# Patient Record
Sex: Female | Born: 1990 | Race: White | Hispanic: No | Marital: Single | Smoking: Current every day smoker
Health system: Southern US, Community
[De-identification: ages and names within clinical notes are randomized; demographics above are authoritative.]

## PROBLEM LIST (undated history)

## (undated) DIAGNOSIS — F329 Major depressive disorder, single episode, unspecified: Secondary | ICD-10-CM

## (undated) DIAGNOSIS — F32A Depression, unspecified: Secondary | ICD-10-CM

## (undated) DIAGNOSIS — F319 Bipolar disorder, unspecified: Secondary | ICD-10-CM

## (undated) DIAGNOSIS — F1910 Other psychoactive substance abuse, uncomplicated: Secondary | ICD-10-CM

## (undated) HISTORY — PX: CHOLECYSTECTOMY: SHX55

## (undated) HISTORY — PX: HERNIA REPAIR: SHX51

## (undated) HISTORY — PX: TUBAL LIGATION: SHX77

## (undated) HISTORY — PX: MYRINGOTOMY: SUR874

---
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
## 2008-02-05 ENCOUNTER — Emergency Department (HOSPITAL_COMMUNITY): Admission: EM | Admit: 2008-02-05 | Discharge: 2008-02-05 | Payer: Self-pay | Admitting: Emergency Medicine

## 2008-02-07 ENCOUNTER — Emergency Department (HOSPITAL_COMMUNITY): Admission: EM | Admit: 2008-02-07 | Discharge: 2008-02-07 | Payer: Self-pay | Admitting: Emergency Medicine

## 2008-02-14 ENCOUNTER — Emergency Department (HOSPITAL_COMMUNITY): Admission: EM | Admit: 2008-02-14 | Discharge: 2008-02-14 | Payer: Self-pay | Admitting: Emergency Medicine

## 2008-11-16 ENCOUNTER — Emergency Department (HOSPITAL_COMMUNITY): Admission: EM | Admit: 2008-11-16 | Discharge: 2008-11-16 | Payer: Self-pay | Admitting: Emergency Medicine

## 2009-10-14 ENCOUNTER — Emergency Department (HOSPITAL_COMMUNITY): Admission: EM | Admit: 2009-10-14 | Discharge: 2009-10-14 | Payer: Self-pay | Admitting: Emergency Medicine

## 2009-11-06 ENCOUNTER — Emergency Department (HOSPITAL_COMMUNITY): Admission: EM | Admit: 2009-11-06 | Discharge: 2009-11-06 | Payer: Self-pay | Admitting: Emergency Medicine

## 2009-12-04 ENCOUNTER — Emergency Department (HOSPITAL_COMMUNITY): Admission: EM | Admit: 2009-12-04 | Discharge: 2009-12-05 | Payer: Self-pay | Admitting: Emergency Medicine

## 2010-01-01 ENCOUNTER — Emergency Department (HOSPITAL_COMMUNITY): Admission: EM | Admit: 2010-01-01 | Discharge: 2010-01-01 | Payer: Self-pay | Admitting: Emergency Medicine

## 2010-01-27 ENCOUNTER — Emergency Department (HOSPITAL_COMMUNITY): Admission: EM | Admit: 2010-01-27 | Discharge: 2010-01-27 | Payer: Self-pay | Admitting: Emergency Medicine

## 2010-03-21 ENCOUNTER — Emergency Department (HOSPITAL_COMMUNITY): Admission: EM | Admit: 2010-03-21 | Discharge: 2010-03-22 | Payer: Self-pay | Admitting: Emergency Medicine

## 2010-06-03 ENCOUNTER — Encounter (HOSPITAL_COMMUNITY): Payer: Self-pay | Admitting: Internal Medicine

## 2010-07-27 LAB — URINALYSIS, ROUTINE W REFLEX MICROSCOPIC
Bilirubin Urine: NEGATIVE
Ketones, ur: NEGATIVE mg/dL
Nitrite: NEGATIVE
Protein, ur: NEGATIVE mg/dL
Specific Gravity, Urine: 1.01 (ref 1.005–1.030)
Urobilinogen, UA: 0.2 mg/dL (ref 0.0–1.0)
pH: 5.5 (ref 5.0–8.0)

## 2010-07-28 LAB — POCT PREGNANCY, URINE: Preg Test, Ur: NEGATIVE

## 2010-07-28 LAB — URINALYSIS, ROUTINE W REFLEX MICROSCOPIC
Bilirubin Urine: NEGATIVE
Protein, ur: 100 mg/dL — AB
Specific Gravity, Urine: 1.02 (ref 1.005–1.030)
Urobilinogen, UA: 1 mg/dL (ref 0.0–1.0)

## 2010-07-29 LAB — URINALYSIS, ROUTINE W REFLEX MICROSCOPIC
Bilirubin Urine: NEGATIVE
Ketones, ur: NEGATIVE mg/dL
Nitrite: NEGATIVE
Protein, ur: NEGATIVE mg/dL
Urobilinogen, UA: 0.2 mg/dL (ref 0.0–1.0)

## 2010-07-29 LAB — PREGNANCY, URINE: Preg Test, Ur: NEGATIVE
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
## 2010-09-28 NOTE — H&P (Signed)
Brittany Payne, LANPHIER NO.:  [id number]   MEDICAL RECORD NO.:  [id number]          PATIENT TYPE:  IPS   LOCATION:  0102                          FACILITY:  BH   PHYSICIAN:  Jasmine Pang, M.D. DATE OF BIRTH:  [birth year]   DATE OF ADMISSION:  07/20/2004  DATE OF DISCHARGE:                         PSYCHIATRIC ADMISSION ASSESSMENT   IDENTIFICATION:  This 20 year old Caucasian female was admitted on an  involuntary basis due to suicidal ideation.   HISTORY OF PRESENT ILLNESS:  The patient states she was admitted due to  making a suicide pact with another boy.  She talked to friends about this  who alerted school staff.  The school then intervened and her parents were  called and she was sent to the mental health center.  She states she has  been depressed with feelings of sadness, hopelessness, worthlessness, low  self-esteem and suicidal.  A major stressors revolves around her  grandmother's death recently.  She was very close to her.   JUSTIFICATION FOR 24-HOUR CARE:  Dangerous to self.   PAST PSYCHIATRIC HISTORY:  A recent psychiatric admission to this hospital  in early February.  Dr. Marlyne Beards put her on Effexor and Seroquel.   SUBSTANCE ABUSE HISTORY:  None.   PAST MEDICAL HISTORY:  Asthma, migraines.   ALLERGIES:  No known drug allergies.   CURRENT MEDICATIONS:  Inderal LA, Singulair, Effexor XR 75 mg q.d., Seroquel  300 mg q.h.s.   FAMILY/SOCIAL HISTORY:  The patient lives in [place] with both  parents.  She gets along with them.  She has four sisters and three  brothers.  She is in the 10th grade and is failing.   MENTAL STATUS EXAM:  The patient presented as a tired, Caucasian female with  poor eye contact.  She was sleepy because she had been asleep prior to this  session.  Speech was soft and slow.  There was psychomotor retardation.  Mood depressed.  Affect sad, constricted.  She denied current suicidal or  homicidal ideation.   Thoughts logical and goal directed.  No psychosis.  Cognitive was grossly within normal limits.   ASSETS/STRENGTHS:  Healthy, somewhat verbal.   ADMISSION DIAGNOSES:   AXIS I:  Mood disorder not otherwise specified.   AXIS II:  Deferred.   AXIS III:  1.  Asthma.  2.  Migraine headaches.   AXIS IV:  Moderate (school).   AXIS V:  Global Assessment of Functioning 25 current; Global Assessment of  Functioning 55 highest past year.   ESTIMATED LENGTH OF STAY:  Five to seven days.   INITIAL DISCHARGE PLAN:  Return home to live with her family.  Follow-up  therapy and medication management will be arranged prior to her discharge.   INITIAL PLAN OF CARE:  Increase Effexor.  Unit therapeutic groups and  activities.  Family therapy.  Discharge planning will be begun immediately  in order to have a smooth transition home.      BHS/MEDQ  D:  07/21/2004  T:  07/21/2004  Job:  [id number]

## 2011-01-28 ENCOUNTER — Encounter: Payer: Self-pay | Admitting: *Deleted

## 2011-01-28 ENCOUNTER — Emergency Department (HOSPITAL_COMMUNITY)
Admission: EM | Admit: 2011-01-28 | Discharge: 2011-01-28 | Disposition: A | Discharge status: Home / Self Care | Payer: Medicaid Other | Attending: Emergency Medicine | Admitting: Emergency Medicine

## 2011-01-28 DIAGNOSIS — F17200 Nicotine dependence, unspecified, uncomplicated: Secondary | ICD-10-CM | POA: Insufficient documentation

## 2011-01-28 DIAGNOSIS — J45909 Unspecified asthma, uncomplicated: Secondary | ICD-10-CM | POA: Insufficient documentation

## 2011-01-28 DIAGNOSIS — K047 Periapical abscess without sinus: Secondary | ICD-10-CM

## 2011-01-28 DIAGNOSIS — K0381 Cracked tooth: Secondary | ICD-10-CM | POA: Insufficient documentation

## 2011-01-28 DIAGNOSIS — K029 Dental caries, unspecified: Secondary | ICD-10-CM | POA: Insufficient documentation

## 2011-01-28 DIAGNOSIS — F319 Bipolar disorder, unspecified: Secondary | ICD-10-CM | POA: Insufficient documentation

## 2011-01-28 HISTORY — DX: Bipolar disorder, unspecified: F31.9

## 2011-01-28 MED ORDER — CLINDAMYCIN HCL 150 MG PO CAPS: 150.0000 mg | ORAL_CAPSULE | Freq: Four times a day (QID) | ORAL | Status: AC

## 2011-01-28 MED ORDER — HYDROCODONE-ACETAMINOPHEN 5-325 MG PO TABS: 1.0000 | ORAL_TABLET | ORAL | Status: AC | PRN

## 2011-01-28 NOTE — ED Provider Notes (Signed)
History     CSN: [id number] Arrival date & time: 01/28/2011  3:20 PM   Chief Complaint  Patient presents with  . Dental Pain     (Include location/radiation/quality/duration/timing/severity/associated sxs/prior treatment) Patient is a 20 y.o. female presenting with tooth pain. The history is provided by the patient.  Dental PainThe primary symptoms include mouth pain and dental injury. Primary symptoms do not include shortness of breath. Primary symptoms comment: she had a fracture of a tooth one week ago which has caused increasing pain.  she was sent by her ob in [place] for treatmentof possible infection.  She was told they would refer her to a dentist as needed. The symptoms began 5 to 7 days ago (she is currently 4 months pregnant.). The symptoms are worsening. The symptoms occur constantly.  Additional symptoms include: dental sensitivity to temperature and gum tenderness. Additional symptoms do not include: gum swelling, trismus, jaw pain, facial swelling, taste disturbance and smell disturbance.     Past Medical History  Diagnosis Date  . Asthma   . Bipolar 1 disorder      Past Surgical History  Procedure Date  . Cholecystectomy     No family history on file.  History  Substance Use Topics  . Smoking status: Current Everyday Smoker -- 0.5 packs/day  . Smokeless tobacco: Not on file  . Alcohol Use: No    OB History    Grav Para Term Preterm Abortions TAB SAB Ect Mult Living   1               Review of Systems  HENT: Negative for facial swelling.   Respiratory: Positive for wheezing. Negative for chest tightness, shortness of breath and stridor.     Allergies  Yellow jacket venom; Penicillins; Toradol; Tramadol; and Vistaril  Home Medications   Current Outpatient Rx  Name Route Sig Dispense Refill  . ACETAMINOPHEN 500 MG PO TABS Oral Take 1,000 mg by mouth once as needed. For pain     . ALBUTEROL SULFATE HFA 108 (90 BASE) MCG/ACT IN AERS Inhalation Inhale  2 puffs into the lungs every 4 (four) hours as needed. For shortness of breath     . FLUTICASONE-SALMETEROL 100-50 MCG/DOSE IN AEPB Inhalation Inhale 1 puff into the lungs every 12 (twelve) hours.      Marland Kitchen CLINDAMYCIN HCL 150 MG PO CAPS Oral Take 1 capsule (150 mg total) by mouth every 6 (six) hours. 28 capsule 0  . HYDROCODONE-ACETAMINOPHEN 5-325 MG PO TABS Oral Take 1 tablet by mouth every 4 (four) hours as needed for pain. 15 tablet 0    Physical Exam    BP 120/66  Pulse 104  Temp(Src) 98.3 F (36.8 C) (Oral)  Resp 20  Ht 5\' 2"  (1.575 m)  Wt 162 lb (73.483 kg)  BMI 29.63 kg/m2  SpO2 100%  Physical Exam  Nursing note and vitals reviewed. Constitutional: She is oriented to person, place, and time. She appears well-developed and well-nourished. No distress.  HENT:  Head: Normocephalic and atraumatic.  Right Ear: Tympanic membrane and external ear normal.  Left Ear: Tympanic membrane and external ear normal.  Mouth/Throat: Oropharynx is clear and moist and mucous membranes are normal. No oral lesions. Dental abscesses present.    Eyes: Conjunctivae are normal.  Neck: Normal range of motion. Neck supple.  Cardiovascular: Normal rate, regular rhythm, normal heart sounds and intact distal pulses.   Pulmonary/Chest: Effort normal. She has wheezes.       Expiratory wheeze throughout.  No respiratory distress. No accessory muscle use.  Abdominal: Soft. Bowel sounds are normal. She exhibits no distension. There is no tenderness.  Musculoskeletal: Normal range of motion.  Lymphadenopathy:    She has no cervical adenopathy.  Neurological: She is alert and oriented to person, place, and time.  Skin: Skin is warm and dry. No erythema.  Psychiatric: She has a normal mood and affect.    ED Course  Procedures      1. Dental abscess      MDM Dental abscess   Discussed respiratory exam.  Patient states she her breathing is much better than 1 week ago.  OB has placed her on advair  which has helped.  Also using albuterol q 4 hours and has med at home.       Candis Musa, PA 01/28/11 1705

## 2011-01-28 NOTE — ED Provider Notes (Signed)
Medical screening examination/treatment/procedure(s) were performed by non-physician practitioner and as supervising physician I was immediately available for consultation/collaboration.   Benny Lennert, MD 01/28/11 [phone]

## 2011-01-28 NOTE — ED Notes (Signed)
Dental pain, patient is 4-5 months pregnant

## 2011-02-07 ENCOUNTER — Encounter (HOSPITAL_COMMUNITY): Payer: Self-pay | Admitting: *Deleted

## 2011-02-07 ENCOUNTER — Emergency Department (HOSPITAL_COMMUNITY)
Admission: EM | Admit: 2011-02-07 | Discharge: 2011-02-07 | Disposition: A | Discharge status: Home / Self Care | Payer: Medicaid Other | Attending: Emergency Medicine | Admitting: Emergency Medicine

## 2011-02-07 DIAGNOSIS — K047 Periapical abscess without sinus: Secondary | ICD-10-CM | POA: Insufficient documentation

## 2011-02-07 DIAGNOSIS — K089 Disorder of teeth and supporting structures, unspecified: Secondary | ICD-10-CM | POA: Insufficient documentation

## 2011-02-07 DIAGNOSIS — K0889 Other specified disorders of teeth and supporting structures: Secondary | ICD-10-CM

## 2011-02-07 DIAGNOSIS — R220 Localized swelling, mass and lump, head: Secondary | ICD-10-CM | POA: Insufficient documentation

## 2011-02-07 MED ORDER — HYDROCODONE-ACETAMINOPHEN 5-325 MG PO TABS: 1.0000 | ORAL_TABLET | ORAL | Status: AC | PRN

## 2011-02-07 MED ORDER — HYDROCODONE-ACETAMINOPHEN 5-325 MG PO TABS
1.0000 | ORAL_TABLET | Freq: Once | ORAL | Status: AC
  Administered 2011-02-07: 1 via ORAL
  Filled 2011-02-07: qty 1

## 2011-02-07 MED ORDER — ONDANSETRON HCL 4 MG PO TABS: 8.0000 mg | ORAL_TABLET | Freq: Three times a day (TID) | ORAL | Status: AC | PRN

## 2011-02-07 NOTE — ED Provider Notes (Signed)
History     CSN: [id number] Arrival date & time: 02/07/2011  3:34 PM  Chief Complaint  Patient presents with  . Dental Pain    (Consider location/radiation/quality/duration/timing/severity/associated sxs/prior treatment) Patient is a 20 y.o. female presenting with tooth pain. The history is provided by the patient.  Dental PainThe primary symptoms include mouth pain. Primary symptoms do not include headaches, fever, shortness of breath or sore throat. Primary symptoms comment: she had a tooth extraction 2 days ago at the dental clinic in [place].  She reports pain and intolerance to the clindamycin she was prescribed,  stating it makes her nauseated. The symptoms began 2 days ago. The symptoms are worsening. The symptoms occur constantly.  Additional symptoms include: gum tenderness and facial swelling. Additional symptoms do not include: drooling and ear pain. Associated symptoms comments: She reports right cheek swelling and fever to 100.1 yesterday, which has resolved.  Patient is currently [redacted] weeks pregnant. She called her gynecologist who cannot see her until next week and was referred here. .     Past Medical History  Diagnosis Date  . Asthma   . Bipolar 1 disorder     Past Surgical History  Procedure Date  . Cholecystectomy     History reviewed. No pertinent family history.  History  Substance Use Topics  . Smoking status: Current Everyday Smoker -- 1.0 packs/day for 8 years    Types: Cigarettes  . Smokeless tobacco: Not on file  . Alcohol Use: No    OB History    Grav Para Term Preterm Abortions TAB SAB Ect Mult Living   1               Review of Systems  Constitutional: Negative for fever.  HENT: Positive for facial swelling and dental problem. Negative for ear pain, congestion, sore throat, drooling, mouth sores, neck pain and ear discharge.   Eyes: Negative.   Respiratory: Negative for chest tightness and shortness of breath.   Cardiovascular: Negative  for chest pain.  Gastrointestinal: Negative for nausea and abdominal pain.  Genitourinary: Negative.   Musculoskeletal: Negative for joint swelling and arthralgias.  Skin: Negative.  Negative for rash and wound.  Neurological: Negative for dizziness, weakness, light-headedness, numbness and headaches.  Hematological: Negative.   Psychiatric/Behavioral: Negative.     Allergies  Yellow jacket venom; Clindamycin/lincomycin; Penicillins; Toradol; Tramadol; and Vistaril  Home Medications   Current Outpatient Rx  Name Route Sig Dispense Refill  . ACETAMINOPHEN 500 MG PO TABS Oral Take 1,000 mg by mouth once as needed. For pain     . ALBUTEROL SULFATE HFA 108 (90 BASE) MCG/ACT IN AERS Inhalation Inhale 2 puffs into the lungs every 4 (four) hours as needed. For shortness of breath     . FLUTICASONE-SALMETEROL 100-50 MCG/DOSE IN AEPB Inhalation Inhale 1 puff into the lungs every 12 (twelve) hours.      Marland Kitchen CLINDAMYCIN HCL 150 MG PO CAPS Oral Take 1 capsule (150 mg total) by mouth every 6 (six) hours. 28 capsule 0  . HYDROCODONE-ACETAMINOPHEN 5-325 MG PO TABS Oral Take 1 tablet by mouth every 4 (four) hours as needed for pain. 15 tablet 0    BP 114/56  Pulse 105  Temp(Src) 98.4 F (36.9 C) (Oral)  Resp 22  Ht 5\' 2"  (1.575 m)  Wt 163 lb (73.936 kg)  BMI 29.81 kg/m2  SpO2 100%  Physical Exam  Nursing note and vitals reviewed. Constitutional: She is oriented to person, place, and time. She appears well-developed  and well-nourished. No distress.  HENT:  Head: Normocephalic and atraumatic.  Right Ear: Tympanic membrane and external ear normal.  Left Ear: Tympanic membrane and external ear normal.  Mouth/Throat: Oropharynx is clear and moist and mucous membranes are normal. No oral lesions. Dental abscesses present.    Eyes: Conjunctivae are normal.  Neck: Normal range of motion. Neck supple.  Cardiovascular: Normal rate, regular rhythm, normal heart sounds and intact distal pulses.     Pulmonary/Chest: Effort normal and breath sounds normal. She has no wheezes.  Abdominal: Soft. Bowel sounds are normal. She exhibits no distension. There is no tenderness.  Musculoskeletal: Normal range of motion.  Lymphadenopathy:    She has no cervical adenopathy.  Neurological: She is alert and oriented to person, place, and time.  Skin: Skin is warm and dry. No erythema.  Psychiatric: She has a normal mood and affect.    ED Course  Procedures (including critical care time)  Labs Reviewed - No data to display No results found.   No diagnosis found.    MDM  Intolerance to clindamycin,  Not allergic reaction.  Encouraged to continue taking clindamycin,  Prescribed zofran to help with nausea.  Also prescribed hydrocodone for pain. Patient was instructed by dentist to swish with warm salt water after meals  Which she is doing.  Follow up next week with gyn.        Candis Musa, PA 02/07/11 [phone]

## 2011-02-07 NOTE — ED Notes (Signed)
Dental pain, states tooth extracted Tuesday at [facility].

## 2011-02-07 NOTE — ED Notes (Signed)
Pt states had upper right tooth extraction on Tuesday at [facility]. Pt is now c/o pain in the "socket". No swelling noted.

## 2011-02-07 NOTE — ED Provider Notes (Signed)
Evaluation and management procedures were performed by the mid-level provider (PA/NP/CNM) under my supervision/collaboration. I was present and available during the ED course. Li Fragoso Y.   Gavin Pound. Carmella Kees, MD 02/07/11 1637

## 2011-03-07 ENCOUNTER — Encounter (HOSPITAL_COMMUNITY): Payer: Self-pay | Admitting: Emergency Medicine

## 2011-03-07 ENCOUNTER — Emergency Department (HOSPITAL_COMMUNITY): Payer: No Typology Code available for payment source

## 2011-03-07 ENCOUNTER — Emergency Department (HOSPITAL_COMMUNITY)
Admission: EM | Admit: 2011-03-07 | Discharge: 2011-03-07 | Disposition: A | Discharge status: Home / Self Care | Payer: No Typology Code available for payment source | Attending: Emergency Medicine | Admitting: Emergency Medicine

## 2011-03-07 DIAGNOSIS — M549 Dorsalgia, unspecified: Secondary | ICD-10-CM | POA: Insufficient documentation

## 2011-03-07 DIAGNOSIS — O2390 Unspecified genitourinary tract infection in pregnancy, unspecified trimester: Secondary | ICD-10-CM | POA: Insufficient documentation

## 2011-03-07 DIAGNOSIS — O99891 Other specified diseases and conditions complicating pregnancy: Secondary | ICD-10-CM | POA: Insufficient documentation

## 2011-03-07 DIAGNOSIS — O2340 Unspecified infection of urinary tract in pregnancy, unspecified trimester: Secondary | ICD-10-CM

## 2011-03-07 DIAGNOSIS — N390 Urinary tract infection, site not specified: Secondary | ICD-10-CM | POA: Insufficient documentation

## 2011-03-07 DIAGNOSIS — R109 Unspecified abdominal pain: Secondary | ICD-10-CM | POA: Insufficient documentation

## 2011-03-07 DIAGNOSIS — O99330 Smoking (tobacco) complicating pregnancy, unspecified trimester: Secondary | ICD-10-CM | POA: Insufficient documentation

## 2011-03-07 LAB — URINALYSIS, ROUTINE W REFLEX MICROSCOPIC
Bilirubin Urine: NEGATIVE
Glucose, UA: NEGATIVE mg/dL
Ketones, ur: NEGATIVE mg/dL
pH: 7 (ref 5.0–8.0)

## 2011-03-07 LAB — CBC
HCT: 32.4 % — ABNORMAL LOW (ref 36.0–46.0)
Hemoglobin: 11.5 g/dL — ABNORMAL LOW (ref 12.0–15.0)
MCH: 32 pg (ref 26.0–34.0)
MCHC: 35.5 g/dL (ref 30.0–36.0)
MCV: 90.3 fL (ref 78.0–100.0)

## 2011-03-07 LAB — URINE MICROSCOPIC-ADD ON

## 2011-03-07 MED ORDER — NITROFURANTOIN MONOHYD MACRO 100 MG PO CAPS: 100.0000 mg | ORAL_CAPSULE | Freq: Two times a day (BID) | ORAL | Status: AC

## 2011-03-07 MED ORDER — ACETAMINOPHEN 325 MG PO TABS
650.0000 mg | ORAL_TABLET | Freq: Once | ORAL | Status: AC
  Administered 2011-03-07: 650 mg via ORAL
  Filled 2011-03-07: qty 2

## 2011-03-07 NOTE — ED Notes (Signed)
Gone to ultrasound

## 2011-03-07 NOTE — Progress Notes (Addendum)
Pt to be removed from EFM/toco.  FHT reassuring and appropriate for GA.  Second RN to review [employer], RNC

## 2011-03-07 NOTE — ED Notes (Signed)
[facility] called and state it is okay to release patient from monitor
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
## 2011-03-07 NOTE — ED Notes (Signed)
States she was evaluated at [facility] a couple of nights ago, her Gyn doctor is in [place]
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
## 2011-03-09 LAB — URINE CULTURE: Colony Count: 10000

## 2011-03-24 ENCOUNTER — Emergency Department (HOSPITAL_COMMUNITY)
Admission: EM | Admit: 2011-03-24 | Discharge: 2011-03-24 | Disposition: A | Discharge status: Home / Self Care | Payer: Medicaid Other | Attending: Emergency Medicine | Admitting: Emergency Medicine

## 2011-03-24 ENCOUNTER — Encounter (HOSPITAL_COMMUNITY): Payer: Self-pay

## 2011-03-24 DIAGNOSIS — F319 Bipolar disorder, unspecified: Secondary | ICD-10-CM | POA: Insufficient documentation

## 2011-03-24 DIAGNOSIS — K089 Disorder of teeth and supporting structures, unspecified: Secondary | ICD-10-CM | POA: Insufficient documentation

## 2011-03-24 DIAGNOSIS — K0889 Other specified disorders of teeth and supporting structures: Secondary | ICD-10-CM

## 2011-03-24 DIAGNOSIS — F17200 Nicotine dependence, unspecified, uncomplicated: Secondary | ICD-10-CM | POA: Insufficient documentation

## 2011-03-24 DIAGNOSIS — J45909 Unspecified asthma, uncomplicated: Secondary | ICD-10-CM | POA: Insufficient documentation

## 2011-03-24 MED ORDER — CLINDAMYCIN HCL 150 MG PO CAPS: 150.0000 mg | ORAL_CAPSULE | Freq: Four times a day (QID) | ORAL | Status: AC

## 2011-03-24 MED ORDER — ACETAMINOPHEN 325 MG PO TABS
650.0000 mg | ORAL_TABLET | Freq: Once | ORAL | Status: AC
  Administered 2011-03-24: 650 mg via ORAL
  Filled 2011-03-24: qty 2

## 2011-03-24 NOTE — ED Notes (Signed)
Pt presents with lower left dental pain. Pt states a piece of her tooth chipped off two days ago.

## 2011-03-24 NOTE — ED Notes (Signed)
fht 140

## 2011-03-24 NOTE — ED Provider Notes (Signed)
History   This chart was scribed for Joya Gaskins, MD by Clarita Crane. The patient was seen in room APA05/APA05 and the patient's care was started at 3:24PM.   CSN: [id number] Arrival date & time: 03/24/2011  3:15 PM   First MD Initiated Contact with Patient 03/24/11 1516      Chief Complaint  Patient presents with  . Dental Pain    HPI Brittany Payne is a 20 y.o. female who is currently pregnant who presents to the Emergency Department complaining of constant moderate, non-radiating right lower dental pain onset 2 days ago and worsening since with no associated symptoms. Patient states she was eating a piece of food when a piece a tooth on her right lower side "chipped off". Patient states pain is not relieved with Tylenol. Denies fever, nausea, vomiting, abdominal pain/cramping, vaginal bleeding. Patient is a current smoker and is followed by Dr. Donzetta Matters in [place], [place] for current pregnancy.   Past Medical History  Diagnosis Date  . Asthma   . Bipolar 1 disorder     Past Surgical History  Procedure Date  . Cholecystectomy     No family history on file.  History  Substance Use Topics  . Smoking status: Current Everyday Smoker -- 1.0 packs/day for 8 years    Types: Cigarettes  . Smokeless tobacco: Not on file  . Alcohol Use: No    OB History    Grav Para Term Preterm Abortions TAB SAB Ect Mult Living   1               Review of Systems 10 Systems reviewed and are negative for acute change except as noted in the HPI.  Allergies  Yellow jacket venom; Penicillins; Toradol; Tramadol; and Vistaril  Home Medications   Current Outpatient Rx  Name Route Sig Dispense Refill  . ACETAMINOPHEN 500 MG PO TABS Oral Take 1,000 mg by mouth once as needed. For pain     . ALBUTEROL SULFATE HFA 108 (90 BASE) MCG/ACT IN AERS Inhalation Inhale 2 puffs into the lungs every 4 (four) hours as needed. For shortness of breath     . FLUTICASONE-SALMETEROL 100-50 MCG/DOSE IN AEPB  Inhalation Inhale 1 puff into the lungs every 12 (twelve) hours.      . SERTRALINE HCL 50 MG PO TABS Oral Take 50 mg by mouth at bedtime.        BP 120/79  Pulse 94  Temp(Src) 97.7 F (36.5 C) (Oral)  Resp 16  Ht 5\' 1"  (1.549 m)  Wt 169 lb 6.4 oz (76.839 kg)  BMI 32.01 kg/m2  SpO2 100%  Physical Exam CONSTITUTIONAL: Well developed/well nourished HEAD AND FACE: Normocephalic/atraumatic EYES: EOMI/PERRL ENMT: Mucous membranes moist, no malocclusion, no trismus, poor dentition, no focal abscess NECK: supple no meningeal signs CV: S1/S2 noted, no murmurs/rubs/gallops noted LUNGS: Lungs are clear to auscultation bilaterally, no apparent distress ABDOMEN: soft, nontender, gravid NEURO: Pt is awake/alert, moves all extremitiesx4 EXTREMITIES: pulses normal, full ROM SKIN: warm, color normal PSYCH: no abnormalities of mood noted  ED Course  Procedures   DIAGNOSTIC STUDIES: Oxygen Saturation is 100% on room air, normal by my interpretation.    COORDINATION OF CARE:     MDM  Nursing notes reviewed and considered in documentation   Pt reports she can take clindamycin     I personally performed the services described in this documentation, which was scribed in my presence. The recorded information has been reviewed and considered.  Joya Gaskins, MD 03/24/11 1550

## 2011-08-16 ENCOUNTER — Emergency Department (HOSPITAL_COMMUNITY)
Admission: EM | Admit: 2011-08-16 | Discharge: 2011-08-16 | Disposition: A | Discharge status: Home / Self Care | Payer: Medicaid Other | Attending: Emergency Medicine | Admitting: Emergency Medicine

## 2011-08-16 ENCOUNTER — Encounter (HOSPITAL_COMMUNITY): Payer: Self-pay

## 2011-08-16 DIAGNOSIS — J45909 Unspecified asthma, uncomplicated: Secondary | ICD-10-CM | POA: Insufficient documentation

## 2011-08-16 DIAGNOSIS — F17200 Nicotine dependence, unspecified, uncomplicated: Secondary | ICD-10-CM | POA: Insufficient documentation

## 2011-08-16 DIAGNOSIS — K0889 Other specified disorders of teeth and supporting structures: Secondary | ICD-10-CM

## 2011-08-16 DIAGNOSIS — K029 Dental caries, unspecified: Secondary | ICD-10-CM | POA: Insufficient documentation

## 2011-08-16 DIAGNOSIS — F319 Bipolar disorder, unspecified: Secondary | ICD-10-CM | POA: Insufficient documentation

## 2011-08-16 MED ORDER — HYDROCODONE-ACETAMINOPHEN 5-325 MG PO TABS
ORAL_TABLET | ORAL | Status: AC
Start: 2011-08-16 — End: 2011-08-26

## 2011-08-16 MED ORDER — DOXYCYCLINE HYCLATE 100 MG PO CAPS: 100.0000 mg | ORAL_CAPSULE | Freq: Two times a day (BID) | ORAL | Status: AC

## 2011-08-16 NOTE — ED Notes (Signed)
C/o of dental pain x 2 days to left lower jaw area, decaying teeth noted, states that she called dentist and unable to make an appt for sooner

## 2011-08-16 NOTE — ED Provider Notes (Signed)
History     CSN: [id number]  Arrival date & time 08/16/11  1148   First MD Initiated Contact with Patient 08/16/11 1157      Chief Complaint  Patient presents with  . Dental Pain    (Consider location/radiation/quality/duration/timing/severity/associated sxs/prior treatment) Patient is a 21 y.o. female presenting with tooth pain. The history is provided by the patient. No language interpreter was used.  Dental PainThe primary symptoms include mouth pain. Primary symptoms do not include dental injury, oral bleeding, oral lesions, headaches, fever, shortness of breath, sore throat, angioedema or cough. The symptoms began 2 days ago. The symptoms are worsening. The symptoms are recurrent. The symptoms occur constantly.  Affected locations include: teeth and gum(s).  Additional symptoms include: dental sensitivity to temperature and gum tenderness. Additional symptoms do not include: gum swelling, purulent gums, trismus, jaw pain, facial swelling, trouble swallowing and drooling. Medical issues include: smoking and periodontal disease.    Past Medical History  Diagnosis Date  . Asthma   . Bipolar 1 disorder     Past Surgical History  Procedure Date  . Cholecystectomy     History reviewed. No pertinent family history.  History  Substance Use Topics  . Smoking status: Current Everyday Smoker -- 1.0 packs/day for 8 years    Types: Cigarettes  . Smokeless tobacco: Not on file  . Alcohol Use: No    OB History    Grav Para Term Preterm Abortions TAB SAB Ect Mult Living   1               Review of Systems  Constitutional: Negative for fever.  HENT: Positive for dental problem. Negative for sore throat, facial swelling, drooling, trouble swallowing, neck pain and neck stiffness.   Respiratory: Negative for cough and shortness of breath.   Neurological: Negative for headaches.  Hematological: Negative for adenopathy.  All other systems reviewed and are  negative.    Allergies  Yellow jacket venom; Clindamycin/lincomycin; Penicillins; Toradol; Tramadol; and Vistaril  Home Medications   Current Outpatient Rx  Name Route Sig Dispense Refill  . ACETAMINOPHEN 500 MG PO TABS Oral Take 1,000 mg by mouth once as needed. For pain     . ALBUTEROL SULFATE HFA 108 (90 BASE) MCG/ACT IN AERS Inhalation Inhale 2 puffs into the lungs every 4 (four) hours as needed. For shortness of breath     . DOXYCYCLINE HYCLATE 100 MG PO CAPS Oral Take 1 capsule (100 mg total) by mouth 2 (two) times daily. 20 capsule 0  . FLUTICASONE-SALMETEROL 100-50 MCG/DOSE IN AEPB Inhalation Inhale 1 puff into the lungs every 12 (twelve) hours.      Marland Kitchen HYDROCODONE-ACETAMINOPHEN 5-325 MG PO TABS  Take one-two tabs po q 4-6 hrs prn pain 15 tablet 0  . SERTRALINE HCL 50 MG PO TABS Oral Take 50 mg by mouth at bedtime.        BP 115/66  Pulse 85  Temp(Src) 97.6 F (36.4 C) (Oral)  Resp 18  Ht 5\' 1"  (1.549 m)  Wt 165 lb (74.844 kg)  BMI 31.18 kg/m2  SpO2 100%  LMP 08/10/2011  Breastfeeding? Unknown  Physical Exam  Nursing note and vitals reviewed. Constitutional: She is oriented to person, place, and time. She appears well-developed and well-nourished. No distress.  HENT:  Head: Normocephalic and atraumatic. No trismus in the jaw.  Mouth/Throat: Uvula is midline, oropharynx is clear and moist and mucous membranes are normal. No oral lesions. Dental caries present. No dental abscesses or  uvula swelling.  Neck: Neck supple.  Cardiovascular: Normal rate, regular rhythm and normal heart sounds.   No murmur heard. Pulmonary/Chest: Effort normal and breath sounds normal.  Musculoskeletal: Normal range of motion.  Lymphadenopathy:    She has no cervical adenopathy.  Neurological: She is alert and oriented to person, place, and time. She exhibits normal muscle tone. Coordination normal.  Skin: Skin is warm and dry.    ED Course  Procedures (including critical care  time)    1. Pain, dental       MDM    Patient is alert. Nontoxic appearing. Chest multiple dental caries and widespread dental disease.  tenderness to palpation over the left lower bicuspids. No obvious dental abscess, facial swelling or trismus. She states she has appointment with the dentist in 2 weeks.   Patient / Family / Caregiver understand and agree with initial ED impression and plan with expectations set for ED visit. Pt stable in ED with no significant deterioration in condition. Pt feels improved after observation and/or treatment in ED.    Danaya Geddis L. Erick Oxendine, [place] 08/16/11 1248

## 2011-08-16 NOTE — ED Notes (Signed)
Pt presents with left lower dental pain x 2 days. Pt with visible tooth decay.

## 2011-08-16 NOTE — Discharge Instructions (Signed)
Dental Pain  A tooth ache may be caused by cavities (tooth decay). Cavities expose the nerve of the tooth to air and hot or cold temperatures. It may come from an infection or abscess (also called a boil or furuncle) around your tooth. It is also often caused by dental caries (tooth decay). This causes the pain you are having.  DIAGNOSIS   Your caregiver can diagnose this problem by exam.  TREATMENT   · If caused by an infection, it may be treated with medications which kill germs (antibiotics) and pain medications as prescribed by your caregiver. Take medications as directed.  · Only take over-the-counter or prescription medicines for pain, discomfort, or fever as directed by your caregiver.  · Whether the tooth ache today is caused by infection or dental disease, you should see your dentist as soon as possible for further care.  SEEK MEDICAL CARE IF:  The exam and treatment you received today has been provided on an emergency basis only. This is not a substitute for complete medical or dental care. If your problem worsens or new problems (symptoms) appear, and you are unable to meet with your dentist, call or return to this location.  SEEK IMMEDIATE MEDICAL CARE IF:   · You have a fever.  · You develop redness and swelling of your face, jaw, or neck.  · You are unable to open your mouth.  · You have severe pain uncontrolled by pain medicine.  MAKE SURE YOU:   · Understand these instructions.  · Will watch your condition.  · Will get help right away if you are not doing well or get worse.  Document Released: 04/29/2005 Document Revised: 04/18/2011 Document Reviewed: 12/16/2007  [employer]® Patient Information ©2012 [employer].

## 2011-08-17 NOTE — ED Provider Notes (Signed)
Medical screening examination/treatment/procedure(s) were performed by non-physician practitioner and as supervising physician I was immediately available for consultation/collaboration.   Benny Lennert, MD 08/17/11 [phone]

## 2011-10-06 ENCOUNTER — Encounter (HOSPITAL_COMMUNITY): Payer: Self-pay | Admitting: *Deleted

## 2011-10-06 ENCOUNTER — Emergency Department (HOSPITAL_COMMUNITY)
Admission: EM | Admit: 2011-10-06 | Discharge: 2011-10-06 | Disposition: A | Discharge status: Home / Self Care | Payer: Medicaid Other | Attending: Emergency Medicine | Admitting: Emergency Medicine

## 2011-10-06 DIAGNOSIS — K029 Dental caries, unspecified: Secondary | ICD-10-CM | POA: Insufficient documentation

## 2011-10-06 DIAGNOSIS — K0889 Other specified disorders of teeth and supporting structures: Secondary | ICD-10-CM

## 2011-10-06 DIAGNOSIS — K089 Disorder of teeth and supporting structures, unspecified: Secondary | ICD-10-CM | POA: Insufficient documentation

## 2011-10-06 DIAGNOSIS — F319 Bipolar disorder, unspecified: Secondary | ICD-10-CM | POA: Insufficient documentation

## 2011-10-06 DIAGNOSIS — J45909 Unspecified asthma, uncomplicated: Secondary | ICD-10-CM | POA: Insufficient documentation

## 2011-10-06 DIAGNOSIS — F17200 Nicotine dependence, unspecified, uncomplicated: Secondary | ICD-10-CM | POA: Insufficient documentation

## 2011-10-06 DIAGNOSIS — Z79899 Other long term (current) drug therapy: Secondary | ICD-10-CM | POA: Insufficient documentation

## 2011-10-06 MED ORDER — DOXYCYCLINE HYCLATE 100 MG PO CAPS: 100.0000 mg | ORAL_CAPSULE | Freq: Two times a day (BID) | ORAL | Status: AC

## 2011-10-06 MED ORDER — HYDROCODONE-ACETAMINOPHEN 5-325 MG PO TABS: ORAL_TABLET | ORAL | Status: AC

## 2011-10-06 NOTE — Discharge Instructions (Signed)
Dental Pain  A tooth ache may be caused by cavities (tooth decay). Cavities expose the nerve of the tooth to air and hot or cold temperatures. It may come from an infection or abscess (also called a boil or furuncle) around your tooth. It is also often caused by dental caries (tooth decay). This causes the pain you are having.  DIAGNOSIS   Your caregiver can diagnose this problem by exam.  TREATMENT   · If caused by an infection, it may be treated with medications which kill germs (antibiotics) and pain medications as prescribed by your caregiver. Take medications as directed.  · Only take over-the-counter or prescription medicines for pain, discomfort, or fever as directed by your caregiver.  · Whether the tooth ache today is caused by infection or dental disease, you should see your dentist as soon as possible for further care.  SEEK MEDICAL CARE IF:  The exam and treatment you received today has been provided on an emergency basis only. This is not a substitute for complete medical or dental care. If your problem worsens or new problems (symptoms) appear, and you are unable to meet with your dentist, call or return to this location.  SEEK IMMEDIATE MEDICAL CARE IF:   · You have a fever.  · You develop redness and swelling of your face, jaw, or neck.  · You are unable to open your mouth.  · You have severe pain uncontrolled by pain medicine.  MAKE SURE YOU:   · Understand these instructions.  · Will watch your condition.  · Will get help right away if you are not doing well or get worse.  Document Released: 04/29/2005 Document Revised: 04/18/2011 Document Reviewed: 12/16/2007  [employer]® Patient Information ©2012 [employer].

## 2011-10-06 NOTE — ED Notes (Signed)
Dental pain, has multiple cavities

## 2011-10-06 NOTE — ED Provider Notes (Signed)
History     CSN: [id number]  Arrival date & time 10/06/11  1402   First MD Initiated Contact with Patient 10/06/11 1514      Chief Complaint  Patient presents with  . Dental Pain    (Consider location/radiation/quality/duration/timing/severity/associated sxs/prior treatment) Patient is a 21 y.o. female presenting with tooth pain. The history is provided by the patient.  Dental PainThe primary symptoms include mouth pain. Primary symptoms do not include oral bleeding, headaches, fever, shortness of breath, sore throat, angioedema or cough. The symptoms began 3 to 5 days ago. The symptoms are unchanged. The symptoms are recurrent. The symptoms occur constantly.  Mouth pain occurs constantly. Mouth pain is unchanged. Affected locations include: teeth and gum(s).  Additional symptoms include: dental sensitivity to temperature and gum tenderness. Additional symptoms do not include: gum swelling, trismus, facial swelling, trouble swallowing, drooling, ear pain and swollen glands. Medical issues include: smoking and periodontal disease.    Past Medical History  Diagnosis Date  . Asthma   . Bipolar 1 disorder     Past Surgical History  Procedure Date  . Cholecystectomy     No family history on file.  History  Substance Use Topics  . Smoking status: Current Everyday Smoker -- 1.0 packs/day for 8 years    Types: Cigarettes  . Smokeless tobacco: Not on file  . Alcohol Use: No    OB History    Grav Para Term Preterm Abortions TAB SAB Ect Mult Living   1               Review of Systems  Constitutional: Negative for fever and appetite change.  HENT: Positive for dental problem. Negative for ear pain, congestion, sore throat, facial swelling, drooling, trouble swallowing, neck pain and neck stiffness.   Eyes: Negative for pain and visual disturbance.  Respiratory: Negative for cough and shortness of breath.   Neurological: Negative for dizziness, facial asymmetry and headaches.    Hematological: Negative for adenopathy.  All other systems reviewed and are negative.    Allergies  Yellow jacket venom; Clindamycin/lincomycin; Ketorolac tromethamine; Penicillins; Tramadol; and Vistaril  Home Medications   Current Outpatient Rx  Name Route Sig Dispense Refill  . ACETAMINOPHEN 500 MG PO TABS Oral Take 1,000 mg by mouth once as needed. For pain     . ALBUTEROL SULFATE HFA 108 (90 BASE) MCG/ACT IN AERS Inhalation Inhale 2 puffs into the lungs every 4 (four) hours as needed. For shortness of breath     . FLUTICASONE-SALMETEROL 100-50 MCG/DOSE IN AEPB Inhalation Inhale 1 puff into the lungs every 12 (twelve) hours.      . SERTRALINE HCL 50 MG PO TABS Oral Take 50 mg by mouth at bedtime.        BP 118/93  Pulse 91  Temp 97.7 F (36.5 C)  Resp 20  Ht 5\' 2"  (1.575 m)  Wt 153 lb (69.4 kg)  BMI 27.98 kg/m2  SpO2 100%  Breastfeeding? Unknown  Physical Exam  Nursing note and vitals reviewed. Constitutional: She appears well-developed and well-nourished. No distress.  HENT:  Head: Normocephalic and atraumatic. No trismus in the jaw.  Mouth/Throat: Uvula is midline, oropharynx is clear and moist and mucous membranes are normal. Dental caries present. No dental abscesses or uvula swelling.       Multiple dental caries and erythema and tenderness of the right upper gums.  No abscess    ED Course  Procedures (including critical care time)  MDM    Previous medical charts, nursing notes and vitals signs from this visit were reviewed by me   All laboratory results and/or imaging results performed on this visit, if applicable, were reviewed by me and discussed with the patient and/or parent as well as recommendation for follow-up    MEDICATIONS GIVEN IN ED:  none   Multiple dental caries and gingivitis. No abscess.   No trismus or facial swelling.  Airway patent.     PRESCRIPTIONS GIVEN AT DISCHARGE:  norco #12, doxy     Pt stable in ED with no  significant deterioration in condition. Pt feels improved after observation and/or treatment in ED. Patient / Family / Caregiver understand and agree with initial ED impression and plan with expectations set for ED visit.  Patient agrees to return to ED for any worsening symptoms            Kagan Hietpas L. Jayel Scaduto, [place] 10/08/11 1143

## 2011-10-10 NOTE — ED Provider Notes (Signed)
Medical screening examination/treatment/procedure(s) were performed by non-physician practitioner and as supervising physician I was immediately available for consultation/collaboration.   Chicquita Mendel M Juwon Scripter, MD 10/10/11 0048 

## 2011-10-25 ENCOUNTER — Emergency Department (HOSPITAL_COMMUNITY)
Admission: EM | Admit: 2011-10-25 | Discharge: 2011-10-25 | Disposition: A | Discharge status: Home / Self Care | Payer: Medicaid Other | Attending: Emergency Medicine | Admitting: Emergency Medicine

## 2011-10-25 DIAGNOSIS — F319 Bipolar disorder, unspecified: Secondary | ICD-10-CM | POA: Insufficient documentation

## 2011-10-25 DIAGNOSIS — F17200 Nicotine dependence, unspecified, uncomplicated: Secondary | ICD-10-CM | POA: Insufficient documentation

## 2011-10-25 DIAGNOSIS — Z79899 Other long term (current) drug therapy: Secondary | ICD-10-CM | POA: Insufficient documentation

## 2011-10-25 DIAGNOSIS — H7290 Unspecified perforation of tympanic membrane, unspecified ear: Secondary | ICD-10-CM

## 2011-10-25 DIAGNOSIS — J45909 Unspecified asthma, uncomplicated: Secondary | ICD-10-CM | POA: Insufficient documentation

## 2011-10-25 MED ORDER — HYDROCODONE-ACETAMINOPHEN 5-500 MG PO TABS: 1.0000 | ORAL_TABLET | ORAL | Status: AC | PRN

## 2011-10-25 MED ORDER — CLINDAMYCIN HCL 150 MG PO CAPS: 150.0000 mg | ORAL_CAPSULE | Freq: Three times a day (TID) | ORAL | Status: AC

## 2011-10-25 NOTE — ED Provider Notes (Signed)
History     CSN: [id number]  Arrival date & time 10/25/11  Rickey Primus   First MD Initiated Contact with Patient 10/25/11 1823      Chief Complaint  Patient presents with  . Otalgia     The history is provided by the patient.   patient reports 3 days of worsening left ear pain.  Yesterday she had foul-smelling drainage coming from her left ear.  She reports ongoing pain in her left ear this time.  She has no fevers or chills.  She denies sore throat.  Her pain is mild to moderate.  Nothing worsens her pain.  Nothing improves her pain.  Her pain is constant.  Past Medical History  Diagnosis Date  . Asthma   . Bipolar 1 disorder     Past Surgical History  Procedure Date  . Cholecystectomy     No family history on file.  History  Substance Use Topics  . Smoking status: Current Everyday Smoker -- 1.0 packs/day for 8 years    Types: Cigarettes  . Smokeless tobacco: Not on file  . Alcohol Use: No    OB History    Grav Para Term Preterm Abortions TAB SAB Ect Mult Living   1               Review of Systems  HENT: Positive for ear pain.   All other systems reviewed and are negative.    Allergies  Yellow jacket venom; Ketorolac tromethamine; Penicillins; Tramadol; and Vistaril  Home Medications   Current Outpatient Rx  Name Route Sig Dispense Refill  . ACETAMINOPHEN 500 MG PO TABS Oral Take 1,000 mg by mouth once as needed. For pain     . ALBUTEROL SULFATE HFA 108 (90 BASE) MCG/ACT IN AERS Inhalation Inhale 2 puffs into the lungs every 4 (four) hours as needed. For shortness of breath     . CLINDAMYCIN HCL 150 MG PO CAPS Oral Take 1 capsule (150 mg total) by mouth 3 (three) times daily. 15 capsule 0  . FLUTICASONE-SALMETEROL 100-50 MCG/DOSE IN AEPB Inhalation Inhale 1 puff into the lungs every 12 (twelve) hours.      Marland Kitchen HYDROCODONE-ACETAMINOPHEN 5-500 MG PO TABS Oral Take 1 tablet by mouth every 4 (four) hours as needed for pain. 8 tablet 0  . SERTRALINE HCL 50 MG PO TABS  Oral Take 50 mg by mouth at bedtime.        BP 107/62  Pulse 81  Temp 98 F (36.7 C) (Oral)  Resp 16  Ht 5\' 2"  (1.575 m)  Wt 158 lb (71.668 kg)  BMI 28.90 kg/m2  SpO2 100%  Breastfeeding? Unknown  Physical Exam  Constitutional: She is oriented to person, place, and time. She appears well-developed and well-nourished.  HENT:  Head: Normocephalic.       Right TM and right ear normal.  Left ear is normal however the left tympanic membrane is ruptured.  There is no swelling or erythema of the left auditory canal.  No drainage noted coming from the left ear.  No left mastoid tenderness  Eyes: EOM are normal.  Neck: Normal range of motion.  Pulmonary/Chest: Effort normal.  Abdominal: She exhibits no distension.  Musculoskeletal: Normal range of motion.  Neurological: She is alert and oriented to person, place, and time.  Skin: Skin is warm.  Psychiatric: She has a normal mood and affect.    ED Course  Procedures (including critical care time)  Labs Reviewed - No data to display  No results found.   1. Ruptured tympanic membrane       MDM  The patient is a ruptured left TM likely from a otitis media.  Patient be referred to ear nose and throat.  She has 3 days of clindamycin at home she'll be prescribed another 5 for complete course.  A short course pain medicine also.  She was told not to swim, take baths, or place drops in her left ear until seen by the anesthetist surgeon        Lyanne Co, MD 10/25/11 [phone]

## 2011-10-25 NOTE — Discharge Instructions (Signed)
Eardrum Perforation The eardrum is a thin, round tissue inside the ear that separates the ear canal from the middle ear. This is the tissue that detects sound and enables you to hear. The eardrum can be punctured or torn (perforated). Eardrums generally heal without help and with little or no permanent hearing loss. CAUSES   Sudden pressure changes that happen in situations like scuba diving or flying in an airplane.   Foreign objects in the ear.   Inserting a cotton-tipped swab in the ear.   Loud noise.   Trauma to the ear.  SYMPTOMS   Hearing loss.   Ear pain.   Ringing in the ears.   Discharge or bleeding from the ear.   Dizziness.   Vomiting.   Facial paralysis.  HOME CARE INSTRUCTIONS   Keep your ear dry, as this improves healing. Swimming, diving, and showers are not allowed until healing is complete. While bathing, protect the ear by placing a piece of cotton covered with petroleum jelly in the outer ear canal.   Only take over-the-counter or prescription medicines for pain, discomfort, or fever as directed by your caregiver.   Blow your nose gently. Forceful blowing increases the pressure in the middle ear and may cause further injury or delay healing.   Resume normal activities, such as showering, when the perforation has healed. Your caregiver can let you know when this has occurred.   Talk to your caregiver before flying on an airplane. Air travel is generally allowed with a perforated eardrum.   If your caregiver has given you a follow-up appointment, it is very important to keep that appointment. Failure to keep the appointment could result in a chronic or permanent injury, pain, hearing loss, and disability.  SEEK IMMEDIATE MEDICAL CARE IF:   You have bleeding or pus coming from your ear.   You have problems with balance, dizziness, nausea, or vomiting.   You develop increased pain.   You have a fever.  MAKE SURE YOU:   Understand these instructions.     Will watch your condition.   Will get help right away if you are not doing well or get worse.  Document Released: 04/26/2000 Document Revised: 04/18/2011 Document Reviewed: 04/28/2008 [employer] Patient Information 2012 [employer]. 

## 2011-10-25 NOTE — ED Notes (Signed)
Lt ear ache for 3 days, Has had tubes in ears in past.

## 2012-01-10 ENCOUNTER — Emergency Department (HOSPITAL_COMMUNITY)
Admission: EM | Admit: 2012-01-10 | Discharge: 2012-01-10 | Disposition: A | Discharge status: Home / Self Care | Payer: Medicaid Other | Attending: Emergency Medicine | Admitting: Emergency Medicine

## 2012-01-10 ENCOUNTER — Emergency Department (HOSPITAL_COMMUNITY): Payer: Medicaid Other

## 2012-01-10 ENCOUNTER — Encounter (HOSPITAL_COMMUNITY): Payer: Self-pay

## 2012-01-10 DIAGNOSIS — N760 Acute vaginitis: Secondary | ICD-10-CM | POA: Insufficient documentation

## 2012-01-10 DIAGNOSIS — F17200 Nicotine dependence, unspecified, uncomplicated: Secondary | ICD-10-CM | POA: Insufficient documentation

## 2012-01-10 DIAGNOSIS — J45909 Unspecified asthma, uncomplicated: Secondary | ICD-10-CM | POA: Insufficient documentation

## 2012-01-10 DIAGNOSIS — R109 Unspecified abdominal pain: Secondary | ICD-10-CM

## 2012-01-10 DIAGNOSIS — F319 Bipolar disorder, unspecified: Secondary | ICD-10-CM | POA: Insufficient documentation

## 2012-01-10 DIAGNOSIS — B9689 Other specified bacterial agents as the cause of diseases classified elsewhere: Secondary | ICD-10-CM | POA: Insufficient documentation

## 2012-01-10 DIAGNOSIS — A499 Bacterial infection, unspecified: Secondary | ICD-10-CM | POA: Insufficient documentation

## 2012-01-10 LAB — CBC WITH DIFFERENTIAL/PLATELET
Basophils Absolute: 0 10*3/uL (ref 0.0–0.1)
Eosinophils Absolute: 0.3 10*3/uL (ref 0.0–0.7)
Eosinophils Relative: 5 % (ref 0–5)
Lymphocytes Relative: 21 % (ref 12–46)
Lymphs Abs: 1.2 10*3/uL (ref 0.7–4.0)
Neutrophils Relative %: 66 % (ref 43–77)
Platelets: 121 10*3/uL — ABNORMAL LOW (ref 150–400)
RBC: 4.22 MIL/uL (ref 3.87–5.11)
RDW: 13.9 % (ref 11.5–15.5)
WBC: 5.8 10*3/uL (ref 4.0–10.5)

## 2012-01-10 LAB — URINALYSIS, ROUTINE W REFLEX MICROSCOPIC
Leukocytes, UA: NEGATIVE
Nitrite: NEGATIVE
Specific Gravity, Urine: 1.02 (ref 1.005–1.030)
Urobilinogen, UA: 0.2 mg/dL (ref 0.0–1.0)
pH: 6 (ref 5.0–8.0)

## 2012-01-10 LAB — BASIC METABOLIC PANEL
CO2: 26 mEq/L (ref 19–32)
Calcium: 9.1 mg/dL (ref 8.4–10.5)
GFR calc non Af Amer: >90 mL/min (ref >90)
Glucose, Bld: 76 mg/dL (ref 70–99)
Potassium: 3.4 mEq/L — ABNORMAL LOW (ref 3.5–5.1)
Sodium: 137 mEq/L (ref 135–145)

## 2012-01-10 LAB — WET PREP, GENITAL: Yeast Wet Prep HPF POC: NONE SEEN

## 2012-01-10 MED ORDER — METRONIDAZOLE 500 MG PO TABS: 500.0000 mg | ORAL_TABLET | Freq: Two times a day (BID) | ORAL | Status: AC

## 2012-01-10 MED ORDER — METOCLOPRAMIDE HCL 10 MG PO TABS: 10.0000 mg | ORAL_TABLET | Freq: Four times a day (QID) | ORAL | Status: DC

## 2012-01-10 MED ORDER — HYDROCODONE-ACETAMINOPHEN 5-325 MG PO TABS: ORAL_TABLET | ORAL | Status: AC

## 2012-01-10 NOTE — ED Provider Notes (Signed)
History     CSN: [id number]  Arrival date & time 01/10/12  0745   First MD Initiated Contact with Patient 01/10/12 [phone]      Chief Complaint  Patient presents with  . Abdominal Pain  . Vaginal Discharge    (Consider location/radiation/quality/duration/timing/severity/associated sxs/prior treatment) Patient is a 21 y.o. female presenting with abdominal pain. The history is provided by the patient.  Abdominal Pain The primary symptoms of the illness include abdominal pain, nausea, diarrhea and vaginal discharge. The primary symptoms of the illness do not include fever, fatigue, shortness of breath, vomiting, hematemesis, hematochezia, dysuria or vaginal bleeding. The current episode started more than 2 days ago. The onset of the illness was gradual. The problem has not changed since onset. The pain came on gradually. The abdominal pain has been gradually worsening since its onset. The abdominal pain is located in the LLQ and left flank. The abdominal pain radiates to the left flank. The abdominal pain is relieved by nothing. Exacerbated by: nothing.  The diarrhea began 3 to 5 days ago. The diarrhea is semi-solid. The diarrhea occurs 2 to 4 times per day.  The vaginal discharge was first noticed more than 2 days ago. Vaginal discharge is a new problem. The patient believes that the vaginal discharge is unchanged since it began. The amount of discharge is moderate. The color of the discharge is white and yellow. The vaginal discharge is not associated with itching, burning or dysuria.   The patient states that she believes she is currently not pregnant. The patient has had a change in bowel habit. Additional symptoms associated with the illness include back pain. Symptoms associated with the illness do not include chills, anorexia, diaphoresis, heartburn, constipation, urgency, hematuria or frequency. Significant associated medical issues do not include PUD, GERD, inflammatory bowel disease,  diabetes, substance abuse or diverticulitis.    Past Medical History  Diagnosis Date  . Asthma   . Bipolar 1 disorder     Past Surgical History  Procedure Date  . Cholecystectomy   . Myringotomy     No family history on file.  History  Substance Use Topics  . Smoking status: Current Everyday Smoker -- 1.0 packs/day for 8 years    Types: Cigarettes  . Smokeless tobacco: Not on file  . Alcohol Use: No    OB History    Grav Para Term Preterm Abortions TAB SAB Ect Mult Living   1               Review of Systems  Constitutional: Negative for fever, chills, diaphoresis and fatigue.  Respiratory: Negative for shortness of breath.   Cardiovascular: Negative for chest pain.  Gastrointestinal: Positive for nausea, abdominal pain and diarrhea. Negative for heartburn, vomiting, constipation, blood in stool, hematochezia, abdominal distention, anal bleeding, rectal pain, anorexia and hematemesis.  Genitourinary: Positive for flank pain and vaginal discharge. Negative for dysuria, urgency, frequency, hematuria, vaginal bleeding, difficulty urinating, genital sores and vaginal pain.  Musculoskeletal: Positive for back pain.  Skin: Negative for color change and itching.  Neurological: Negative for dizziness and headaches.  Hematological: Negative for adenopathy.  All other systems reviewed and are negative.    Allergies  Yellow jacket venom; Ketorolac tromethamine; Penicillins; Tramadol; and Vistaril  Home Medications   Current Outpatient Rx  Name Route Sig Dispense Refill  . ZOLPIDEM TARTRATE 5 MG PO TABS Oral Take 5 mg by mouth at bedtime as needed.    . ACETAMINOPHEN 500 MG PO TABS Oral Take  1,000 mg by mouth once as needed. For pain     . ALBUTEROL SULFATE HFA 108 (90 BASE) MCG/ACT IN AERS Inhalation Inhale 2 puffs into the lungs every 4 (four) hours as needed. For shortness of breath     . FLUTICASONE-SALMETEROL 100-50 MCG/DOSE IN AEPB Inhalation Inhale 1 puff into the  lungs every 12 (twelve) hours.      . SERTRALINE HCL 50 MG PO TABS Oral Take 50 mg by mouth at bedtime.        BP 114/58  Pulse 88  Temp 98.1 F (36.7 C) (Oral)  Resp 20  Ht 5\' 3"  (1.6 m)  Wt 163 lb (73.936 kg)  BMI 28.87 kg/m2  SpO2 99%  LMP 12/31/2011  Physical Exam  Nursing note and vitals reviewed. Constitutional: She is oriented to person, place, and time. She appears well-developed and well-nourished. No distress.  HENT:  Head: Normocephalic and atraumatic.  Mouth/Throat: Oropharynx is clear and moist.  Cardiovascular: Normal rate, regular rhythm, normal heart sounds and intact distal pulses.   No murmur heard. Pulmonary/Chest: Effort normal and breath sounds normal.  Abdominal: Soft. Bowel sounds are normal. She exhibits no distension and no mass. There is tenderness in the left lower quadrant. There is no rigidity, no rebound, no guarding, no CVA tenderness and no tenderness at McBurney's point.       Mild ttp of the left flank.  Mild ttp of the LLQ w/o guarding, or rebound tenderness   Genitourinary: Uterus normal. Cervix exhibits discharge. Cervix exhibits no motion tenderness and no friability. Right adnexum displays no mass, no tenderness and no fullness. Left adnexum displays no mass, no tenderness and no fullness. No tenderness or bleeding around the vagina. No foreign body around the vagina. Vaginal discharge found.       White-yellow thick discharge present in the vaginal vault.  No bleeding.  No adnexal tenderness, CMT, or masses.   Musculoskeletal: She exhibits no edema.  Neurological: She is alert and oriented to person, place, and time. She exhibits normal muscle tone. Coordination normal.  Skin: Skin is warm and dry.    ED Course  Procedures (including critical care time)  Results for orders placed during the hospital encounter of 01/10/12  URINALYSIS, ROUTINE W REFLEX MICROSCOPIC      Component Value Range   Color, Urine YELLOW  YELLOW   APPearance CLEAR   CLEAR   Specific Gravity, Urine 1.020  1.005 - 1.030   pH 6.0  5.0 - 8.0   Glucose, UA NEGATIVE  NEGATIVE mg/dL   Hgb urine dipstick NEGATIVE  NEGATIVE   Bilirubin Urine NEGATIVE  NEGATIVE   Ketones, ur NEGATIVE  NEGATIVE mg/dL   Protein, ur NEGATIVE  NEGATIVE mg/dL   Urobilinogen, UA 0.2  0.0 - 1.0 mg/dL   Nitrite NEGATIVE  NEGATIVE   Leukocytes, UA NEGATIVE  NEGATIVE  PREGNANCY, URINE      Component Value Range   Preg Test, Ur NEGATIVE  NEGATIVE  CBC WITH DIFFERENTIAL      Component Value Range   WBC 5.8  4.0 - 10.5 K/uL   RBC 4.22  3.87 - 5.11 MIL/uL   Hemoglobin 12.8  12.0 - 15.0 g/dL   HCT 16.1  09.6 - 04.5 %   MCV 86.7  78.0 - 100.0 fL   MCH 30.3  26.0 - 34.0 pg   MCHC 35.0  30.0 - 36.0 g/dL   RDW 40.9  81.1 - 91.4 %   Platelets 121 (*) 150 -  400 K/uL   Neutrophils Relative 66  43 - 77 %   Neutro Abs 3.8  1.7 - 7.7 K/uL   Lymphocytes Relative 21  12 - 46 %   Lymphs Abs 1.2  0.7 - 4.0 K/uL   Monocytes Relative 9  3 - 12 %   Monocytes Absolute 0.5  0.1 - 1.0 K/uL   Eosinophils Relative 5  0 - 5 %   Eosinophils Absolute 0.3  0.0 - 0.7 K/uL   Basophils Relative 0  0 - 1 %   Basophils Absolute 0.0  0.0 - 0.1 K/uL  BASIC METABOLIC PANEL      Component Value Range   Sodium 137  135 - 145 mEq/L   Potassium 3.4 (*) 3.5 - 5.1 mEq/L   Chloride 103  96 - 112 mEq/L   CO2 26  19 - 32 mEq/L   Glucose, Bld 76  70 - 99 mg/dL   BUN 7  6 - 23 mg/dL   Creatinine, Ser 1.61  0.50 - 1.10 mg/dL   Calcium 9.1  8.4 - 09.6 mg/dL   GFR calc non Af Amer >90  >90 mL/min   GFR calc Af Amer >90  >90 mL/min  WET PREP, GENITAL      Component Value Range   Yeast Wet Prep HPF POC NONE SEEN  NONE SEEN   Trich, Wet Prep NONE SEEN  NONE SEEN   Clue Cells Wet Prep HPF POC MANY (*) NONE SEEN   WBC, Wet Prep HPF POC MANY (*) NONE SEEN     Ct Abdomen Pelvis Wo Contrast  01/10/2012  *RADIOLOGY REPORT*  Clinical Data: Left lower quadrant abdominal pain.  CT ABDOMEN AND PELVIS WITHOUT CONTRAST   Technique:  Multidetector CT imaging of the abdomen and pelvis was performed following the standard protocol without intravenous contrast.  Comparison: 05/28/2010  Findings: Stable small nodules along the right hemidiaphragm and right minor fissure, and subpleural nodule in the left lower lobe, all stable from 2009 and accordingly considered benign.  Trace pericardial effusion noted.  No cardiomegaly.  Mild splenomegaly noted with splenic volume estimated at 420 cc. Noncontrast CT appearance of the liver, pancreas, and adrenal glands appears normal.  Prior cholecystectomy noted.  The kidneys appear unremarkable, as do the proximal ureters.  No ureteral calculus observed.  No findings of appendiceal inflammation.  Clustered mesenteric lymph nodes are not pathologically enlarged but are increased in number.  There is a similar increase in number of small retroperitoneal lymph nodes.  Air-fluid levels are scattered in loops of nondilated small bowel. Formed stool noted in the colon.  The uterus and adnexa appear unremarkable.  IMPRESSION:  1.  Numerous small mesenteric lymph nodes may be reactive. 2.  Scattered air-fluid levels in nondilated small bowel, query mild enteritis. 3.  Mild chronic splenomegaly. 4.  Small benign bibasilar subpleural lymph nodes, unchanged in 2009. 5.  Trace pericardial effusion.   Original Report Authenticated By: Dellia Cloud, M.D.      GC and chlamydia culture pending  MDM     Vitals stable,  Pt is non-toxic appearing.  drinking fluids w/o difficulty.  I have reviewed today's lab results and CT findings with the patient.  Sx's are likely related to viral process, although early Crohn's is possibility since her father has Crohn's.  She agrees to close follow-up with her PMD , I have also advised her to return here if her sx's worsen.  Pt verbalized understanding and agrees to care  plan.  I will also treat for BV.    The patient appears reasonably screened and/or  stabilized for discharge and I doubt any other medical condition or other [facility] requiring further screening, evaluation, or treatment in the ED at this time prior to discharge.   Prescribed: Flagyl norco #15 reglan     Ned Kakar L. Tzvi Economou, [place] 01/10/12 1128

## 2012-01-10 NOTE — ED Notes (Signed)
Pt reports having left lower quad ab pain for 2 weeks, noted blood in urine once, vaginal discharge for several days--white in color. +diarrhea, denies any n/v or fever.

## 2012-01-11 LAB — GC/CHLAMYDIA PROBE AMP, GENITAL
Chlamydia, DNA Probe: NEGATIVE
GC Probe Amp, Genital: NEGATIVE

## 2012-01-13 NOTE — ED Provider Notes (Signed)
Medical screening examination/treatment/procedure(s) were performed by non-physician practitioner and as supervising physician I was immediately available for consultation/collaboration.   Shelda Jakes, MD 01/13/12 1001

## 2012-01-26 ENCOUNTER — Emergency Department (HOSPITAL_COMMUNITY)
Admission: EM | Admit: 2012-01-26 | Discharge: 2012-01-26 | Disposition: A | Discharge status: Home / Self Care | Payer: Medicaid Other | Attending: Emergency Medicine | Admitting: Emergency Medicine

## 2012-01-26 ENCOUNTER — Encounter (HOSPITAL_COMMUNITY): Payer: Self-pay | Admitting: Emergency Medicine

## 2012-01-26 DIAGNOSIS — Z91038 Other insect allergy status: Secondary | ICD-10-CM | POA: Insufficient documentation

## 2012-01-26 DIAGNOSIS — F17200 Nicotine dependence, unspecified, uncomplicated: Secondary | ICD-10-CM | POA: Insufficient documentation

## 2012-01-26 DIAGNOSIS — J45909 Unspecified asthma, uncomplicated: Secondary | ICD-10-CM | POA: Insufficient documentation

## 2012-01-26 DIAGNOSIS — F319 Bipolar disorder, unspecified: Secondary | ICD-10-CM | POA: Insufficient documentation

## 2012-01-26 DIAGNOSIS — Z888 Allergy status to other drugs, medicaments and biological substances status: Secondary | ICD-10-CM | POA: Insufficient documentation

## 2012-01-26 DIAGNOSIS — H6690 Otitis media, unspecified, unspecified ear: Secondary | ICD-10-CM | POA: Insufficient documentation

## 2012-01-26 DIAGNOSIS — Z880 Allergy status to penicillin: Secondary | ICD-10-CM | POA: Insufficient documentation

## 2012-01-26 DIAGNOSIS — H9209 Otalgia, unspecified ear: Secondary | ICD-10-CM | POA: Insufficient documentation

## 2012-01-26 MED ORDER — AZITHROMYCIN 250 MG PO TABS: ORAL_TABLET | ORAL | Status: AC

## 2012-01-26 NOTE — ED Notes (Signed)
Pt c/o L ear ache since last night. Recent tube in this ear. Went swimming in last couple days also. Nad.

## 2012-01-26 NOTE — ED Provider Notes (Signed)
History     CSN: [id number]  Arrival date & time 01/26/12  1353   First MD Initiated Contact with Patient 01/26/12 1540      Chief Complaint  Patient presents with  . Otalgia    (Consider location/radiation/quality/duration/timing/severity/associated sxs/prior treatment) Patient is a 21 y.o. female presenting with ear pain. The history is provided by the patient.  Otalgia Pertinent negatives include no headaches, no hearing loss, no cough and no rash.  pt c/o left ear pain for past couple days. Constant, dull.  Notes hx otitis media. Denies trauma or fb to ear. No headache. No fever or chills. No sore throat. No hearing loss. States had left ear tube placed approximately 1 month ago.     Past Medical History  Diagnosis Date  . Asthma   . Bipolar 1 disorder     Past Surgical History  Procedure Date  . Cholecystectomy   . Myringotomy     History reviewed. No pertinent family history.  History  Substance Use Topics  . Smoking status: Current Every Day Smoker -- 1.0 packs/day for 8 years    Types: Cigarettes  . Smokeless tobacco: Not on file  . Alcohol Use: No    OB History    Grav Para Term Preterm Abortions TAB SAB Ect Mult Living   1               Review of Systems  Constitutional: Negative for fever and chills.  HENT: Positive for ear pain. Negative for hearing loss.   Respiratory: Negative for cough.   Skin: Negative for rash.  Neurological: Negative for headaches.    Allergies  Yellow jacket venom; Ketorolac tromethamine; Penicillins; Tramadol; and Vistaril  Home Medications   Current Outpatient Rx  Name Route Sig Dispense Refill  . ACETAMINOPHEN 500 MG PO TABS Oral Take 1,000 mg by mouth every 6 (six) hours as needed. For pain    . ALBUTEROL SULFATE HFA 108 (90 BASE) MCG/ACT IN AERS Inhalation Inhale 2 puffs into the lungs every 4 (four) hours as needed. For shortness of breath     . FLUTICASONE-SALMETEROL 100-50 MCG/DOSE IN AEPB Inhalation Inhale  1 puff into the lungs every 12 (twelve) hours.      . IBUPROFEN 800 MG PO TABS Oral Take 800 mg by mouth every 8 (eight) hours as needed. pain    . METOCLOPRAMIDE HCL 10 MG PO TABS Oral Take 1 tablet (10 mg total) by mouth every 6 (six) hours. 15 tablet 0  . ZOLPIDEM TARTRATE 5 MG PO TABS Oral Take 5 mg by mouth at bedtime as needed.      BP 108/97  Pulse 89  Temp 98.3 F (36.8 C) (Oral)  Resp 18  SpO2 100%  LMP 12/31/2011  Physical Exam  Nursing note and vitals reviewed. Constitutional: She appears well-developed and well-nourished. No distress.  HENT:  Nose: Nose normal.  Mouth/Throat: Oropharynx is clear and moist.       Left tm erythematous. Small hole in tm, no tube visualized. eac clear, not swollen or tender. No mastoid tenderness  Eyes: Conjunctivae normal are normal. No scleral icterus.  Neck: Neck supple. No tracheal deviation present.  Cardiovascular: Normal rate.   Pulmonary/Chest: Effort normal. No respiratory distress.  Abdominal: Normal appearance. She exhibits no distension.  Musculoskeletal: She exhibits no edema.  Neurological: She is alert.  Skin: Skin is warm and dry. No rash noted.  Psychiatric: She has a normal mood and affect.    ED Course  Procedures (including critical care time)     MDM  Pt notes allergy to pcn, rash. No anaphylaxis. Confirmed no other abx allergies.             Suzi Roots, MD 01/26/12 [phone]

## 2012-03-09 ENCOUNTER — Encounter (HOSPITAL_COMMUNITY): Payer: Self-pay | Admitting: *Deleted

## 2012-03-09 ENCOUNTER — Emergency Department (HOSPITAL_COMMUNITY): Payer: Medicaid Other

## 2012-03-09 ENCOUNTER — Emergency Department (HOSPITAL_COMMUNITY)
Admission: EM | Admit: 2012-03-09 | Discharge: 2012-03-09 | Disposition: A | Discharge status: Home / Self Care | Payer: Medicaid Other | Attending: Emergency Medicine | Admitting: Emergency Medicine

## 2012-03-09 DIAGNOSIS — Y92009 Unspecified place in unspecified non-institutional (private) residence as the place of occurrence of the external cause: Secondary | ICD-10-CM | POA: Insufficient documentation

## 2012-03-09 DIAGNOSIS — Z792 Long term (current) use of antibiotics: Secondary | ICD-10-CM | POA: Insufficient documentation

## 2012-03-09 DIAGNOSIS — M545 Low back pain, unspecified: Secondary | ICD-10-CM | POA: Insufficient documentation

## 2012-03-09 DIAGNOSIS — N949 Unspecified condition associated with female genital organs and menstrual cycle: Secondary | ICD-10-CM | POA: Insufficient documentation

## 2012-03-09 DIAGNOSIS — W19XXXA Unspecified fall, initial encounter: Secondary | ICD-10-CM

## 2012-03-09 DIAGNOSIS — R296 Repeated falls: Secondary | ICD-10-CM | POA: Insufficient documentation

## 2012-03-09 DIAGNOSIS — J45909 Unspecified asthma, uncomplicated: Secondary | ICD-10-CM | POA: Insufficient documentation

## 2012-03-09 DIAGNOSIS — Z79899 Other long term (current) drug therapy: Secondary | ICD-10-CM | POA: Insufficient documentation

## 2012-03-09 DIAGNOSIS — F17200 Nicotine dependence, unspecified, uncomplicated: Secondary | ICD-10-CM | POA: Insufficient documentation

## 2012-03-09 DIAGNOSIS — Y93E1 Activity, personal bathing and showering: Secondary | ICD-10-CM | POA: Insufficient documentation

## 2012-03-09 DIAGNOSIS — T07XXXA Unspecified multiple injuries, initial encounter: Secondary | ICD-10-CM | POA: Insufficient documentation

## 2012-03-09 DIAGNOSIS — F319 Bipolar disorder, unspecified: Secondary | ICD-10-CM | POA: Insufficient documentation

## 2012-03-09 LAB — URINE MICROSCOPIC-ADD ON

## 2012-03-09 LAB — URINALYSIS, ROUTINE W REFLEX MICROSCOPIC
Bilirubin Urine: NEGATIVE
Ketones, ur: NEGATIVE mg/dL
Specific Gravity, Urine: 1.02 (ref 1.005–1.030)
pH: 7 (ref 5.0–8.0)

## 2012-03-09 MED ORDER — HYDROCODONE-ACETAMINOPHEN 5-325 MG PO TABS
ORAL_TABLET | ORAL | Status: AC
  Filled 2012-03-09: qty 1

## 2012-03-09 MED ORDER — HYDROCODONE-ACETAMINOPHEN 5-325 MG PO TABS
1.0000 | ORAL_TABLET | Freq: Once | ORAL | Status: AC
  Administered 2012-03-09: 1 via ORAL

## 2012-03-09 MED ORDER — HYDROCOD POLST-CHLORPHEN POLST 10-8 MG/5ML PO LQCR
5.0000 mL | Freq: Once | ORAL | Status: DC
  Filled 2012-03-09: qty 5

## 2012-03-09 MED ORDER — HYDROCODONE-ACETAMINOPHEN 5-325 MG PO TABS: 1.0000 | ORAL_TABLET | Freq: Four times a day (QID) | ORAL | Status: AC | PRN

## 2012-03-09 NOTE — ED Notes (Addendum)
Fall last pm in shower.  Back pain , dysuria,and pain rt leg. No LOC or HI.  Pt says her rt knee "popped " and she fell in shower.

## 2012-03-09 NOTE — ED Provider Notes (Signed)
History     CSN: [id number]  Arrival date & time 03/09/12  1121   First MD Initiated Contact with Patient 03/09/12 1135      Chief Complaint  Patient presents with  . Fall    (Consider location/radiation/quality/duration/timing/severity/associated sxs/prior treatment) HPI Comments: R knee popped while she was taking a shower and she fell.  C/o low back pain and pelvis pain.  The history is provided by the patient. No language interpreter was used.    Past Medical History  Diagnosis Date  . Asthma   . Bipolar 1 disorder     Past Surgical History  Procedure Date  . Cholecystectomy   . Myringotomy     History reviewed. No pertinent family history.  History  Substance Use Topics  . Smoking status: Current Every Day Smoker -- 1.0 packs/day for 8 years    Types: Cigarettes  . Smokeless tobacco: Not on file  . Alcohol Use: No    OB History    Grav Para Term Preterm Abortions TAB SAB Ect Mult Living   1               Review of Systems  Musculoskeletal: Positive for back pain.       R pelvis pain and R knee pain   Skin: Negative for wound.  Neurological: Negative for weakness and numbness.  All other systems reviewed and are negative.    Allergies  Yellow jacket venom; Ketorolac tromethamine; Penicillins; Tramadol; and Vistaril  Home Medications   Current Outpatient Rx  Name Route Sig Dispense Refill  . ACETAMINOPHEN 500 MG PO TABS Oral Take 1,000 mg by mouth every 6 (six) hours as needed. For pain    . ALBUTEROL SULFATE HFA 108 (90 BASE) MCG/ACT IN AERS Inhalation Inhale 2 puffs into the lungs every 4 (four) hours as needed. For shortness of breath     . FLINTSTONES COMPLETE 60 MG PO CHEW Oral Chew 1 tablet by mouth daily.    Marland Kitchen FLUTICASONE-SALMETEROL 100-50 MCG/DOSE IN AEPB Inhalation Inhale 1 puff into the lungs every 12 (twelve) hours.      . IBUPROFEN 800 MG PO TABS Oral Take 800 mg by mouth every 8 (eight) hours as needed. pain    . METOCLOPRAMIDE  HCL 10 MG PO TABS Oral Take 10 mg by mouth daily as needed. Nausea    . MONTELUKAST SODIUM 10 MG PO TABS Oral Take 10 mg by mouth at bedtime.    Marland Kitchen ZOLPIDEM TARTRATE 5 MG PO TABS Oral Take 5 mg by mouth at bedtime.     Marland Kitchen HYDROCODONE-ACETAMINOPHEN 5-325 MG PO TABS Oral Take 1 tablet by mouth every 6 (six) hours as needed for pain. 20 tablet 0  . METOCLOPRAMIDE HCL 10 MG PO TABS Oral Take 1 tablet (10 mg total) by mouth every 6 (six) hours. 15 tablet 0    BP 124/65  Pulse 86  Temp 98.2 F (36.8 C) (Oral)  Resp 20  Ht 5\' 3"  (1.6 m)  Wt 158 lb (71.668 kg)  BMI 27.99 kg/m2  SpO2 100%  LMP 03/02/2012  Physical Exam  Nursing note and vitals reviewed. Constitutional: She is oriented to person, place, and time. She appears well-developed and well-nourished. No distress.  HENT:  Head: Normocephalic and atraumatic.  Eyes: EOM are normal.  Neck: Normal range of motion.  Cardiovascular: Normal rate and regular rhythm.   Pulmonary/Chest: Effort normal.  Abdominal: Soft. She exhibits no distension. There is no tenderness.  Musculoskeletal: She exhibits  tenderness.       Right hip: She exhibits decreased range of motion and tenderness.       Back:       Legs: Neurological: She is alert and oriented to person, place, and time.  Skin: Skin is warm and dry.  Psychiatric: She has a normal mood and affect. Judgment normal.    ED Course  Procedures (including critical care time)  Labs Reviewed  URINALYSIS, ROUTINE W REFLEX MICROSCOPIC - Abnormal; Notable for the following:    Hgb urine dipstick SMALL (*)     All other components within normal limits  URINE MICROSCOPIC-ADD ON - Abnormal; Notable for the following:    Squamous Epithelial / LPF FEW (*)     Bacteria, UA FEW (*)     All other components within normal limits   Dg Lumbar Spine Complete  03/09/2012  *RADIOLOGY REPORT*  Clinical Data: Fall.  Back pain  LUMBAR SPINE - COMPLETE 4+ VIEW  Comparison:  None.  Findings:  There is no  evidence of lumbar spine fracture. Alignment is normal.  Intervertebral disc spaces are maintained.  IMPRESSION: Negative.   Original Report Authenticated By: Camelia Phenes, M.D.    Dg Pelvis 1-2 Views  03/09/2012  *RADIOLOGY REPORT*  Clinical Data: Recent traumatic injury with pain  PELVIS - 1-2 VIEW  Comparison: None.  Findings: No acute fracture or dislocation is identified.  No soft tissue abnormality is seen.  IMPRESSION: No acute abnormality noted.   Original Report Authenticated By: Phillips Odor, M.D.    Dg Knee Complete 4 Views Right  03/09/2012  *RADIOLOGY REPORT*  Clinical Data: Fall yesterday with knee pain  RIGHT KNEE - COMPLETE 4+ VIEW  Comparison: 01/18/2008  Findings: No acute fracture or dislocation is noted.  No joint effusion is seen.  No soft tissue abnormality is noted.  IMPRESSION: No acute abnormality.   Original Report Authenticated By: Phillips Odor, M.D.      1. Multiple contusions   2. Fall       MDM  rx-hydrocodone, 20 Ice F/u with dr. [name] prn        Evalina Field, PA 03/09/12 1311  Evalina Field, [place] 03/16/12 1918  Evalina Field, [place] 03/16/12 1920

## 2012-03-17 NOTE — ED Provider Notes (Signed)
Medical screening examination/treatment/procedure(s) were performed by non-physician practitioner and as supervising physician I was immediately available for consultation/collaboration.  Shelda Jakes, MD 03/17/12 [phone]

## 2012-04-13 ENCOUNTER — Emergency Department (HOSPITAL_COMMUNITY): Payer: Medicaid Other

## 2012-04-13 ENCOUNTER — Encounter (HOSPITAL_COMMUNITY): Payer: Self-pay | Admitting: Emergency Medicine

## 2012-04-13 ENCOUNTER — Emergency Department (HOSPITAL_COMMUNITY)
Admission: EM | Admit: 2012-04-13 | Discharge: 2012-04-13 | Disposition: A | Discharge status: Home / Self Care | Payer: Medicaid Other | Attending: Emergency Medicine | Admitting: Emergency Medicine

## 2012-04-13 DIAGNOSIS — W19XXXA Unspecified fall, initial encounter: Secondary | ICD-10-CM

## 2012-04-13 DIAGNOSIS — Z79899 Other long term (current) drug therapy: Secondary | ICD-10-CM | POA: Insufficient documentation

## 2012-04-13 DIAGNOSIS — M25562 Pain in left knee: Secondary | ICD-10-CM

## 2012-04-13 DIAGNOSIS — Y9389 Activity, other specified: Secondary | ICD-10-CM | POA: Insufficient documentation

## 2012-04-13 DIAGNOSIS — Z9889 Other specified postprocedural states: Secondary | ICD-10-CM | POA: Insufficient documentation

## 2012-04-13 DIAGNOSIS — Y9229 Other specified public building as the place of occurrence of the external cause: Secondary | ICD-10-CM | POA: Insufficient documentation

## 2012-04-13 DIAGNOSIS — W010XXA Fall on same level from slipping, tripping and stumbling without subsequent striking against object, initial encounter: Secondary | ICD-10-CM | POA: Insufficient documentation

## 2012-04-13 DIAGNOSIS — R937 Abnormal findings on diagnostic imaging of other parts of musculoskeletal system: Secondary | ICD-10-CM | POA: Insufficient documentation

## 2012-04-13 DIAGNOSIS — F17200 Nicotine dependence, unspecified, uncomplicated: Secondary | ICD-10-CM | POA: Insufficient documentation

## 2012-04-13 DIAGNOSIS — F319 Bipolar disorder, unspecified: Secondary | ICD-10-CM | POA: Insufficient documentation

## 2012-04-13 DIAGNOSIS — S8000XA Contusion of unspecified knee, initial encounter: Secondary | ICD-10-CM | POA: Insufficient documentation

## 2012-04-13 DIAGNOSIS — J45909 Unspecified asthma, uncomplicated: Secondary | ICD-10-CM | POA: Insufficient documentation

## 2012-04-13 DIAGNOSIS — S8002XA Contusion of left knee, initial encounter: Secondary | ICD-10-CM

## 2012-04-13 MED ORDER — IBUPROFEN 800 MG PO TABS
800.0000 mg | ORAL_TABLET | Freq: Once | ORAL | Status: AC
  Administered 2012-04-13: 800 mg via ORAL
  Filled 2012-04-13: qty 1

## 2012-04-13 MED ORDER — IBUPROFEN 800 MG PO TABS: 800.0000 mg | ORAL_TABLET | Freq: Three times a day (TID) | ORAL | Status: DC | PRN

## 2012-04-13 MED ORDER — IBUPROFEN 100 MG/5ML PO SUSP: 800.0000 mg | Freq: Once | ORAL | Status: DC

## 2012-04-13 NOTE — ED Provider Notes (Addendum)
History     CSN: [id number]  Arrival date & time 04/13/12  4098   First MD Initiated Contact with Patient 04/13/12 0404      Chief Complaint  Patient presents with  . Fall    (Consider location/radiation/quality/duration/timing/severity/associated sxs/prior treatment) HPI 21 year old female presents to emergency room complaining of left knee pain. Patient is visiting her sister upstairs in the hospital. She reports she went to the bathroom and slipped on some paper towels. She reports coming down onto her left knee. She is complaining of pain to the medial aspect of her knee with buckling to the outside. Patient noted to have bruising over both legs. She feels that the bruise on top of her knee is new. Patient has heard crackling and popping when she walks. She has been able to ambulate since the fall. She was walking out to get a smoke when she decided that she should be evaluated in the ER. Past Medical History  Diagnosis Date  . Asthma   . Bipolar 1 disorder     Past Surgical History  Procedure Date  . Cholecystectomy   . Myringotomy     No family history on file.  History  Substance Use Topics  . Smoking status: Current Every Day Smoker -- 1.0 packs/day for 8 years    Types: Cigarettes  . Smokeless tobacco: Not on file  . Alcohol Use: No    OB History    Grav Para Term Preterm Abortions TAB SAB Ect Mult Living   1               Review of Systems  See History of Present Illness; otherwise all other systems are reviewed and negative  Allergies  Yellow jacket venom; Ketorolac tromethamine; Penicillins; Tramadol; and Vistaril  Home Medications   Current Outpatient Rx  Name  Route  Sig  Dispense  Refill  . ACETAMINOPHEN 500 MG PO TABS   Oral   Take 1,000 mg by mouth every 6 (six) hours as needed. For pain         . ALBUTEROL SULFATE HFA 108 (90 BASE) MCG/ACT IN AERS   Inhalation   Inhale 2 puffs into the lungs every 4 (four) hours as needed. For shortness  of breath          . FLINTSTONES COMPLETE 60 MG PO CHEW   Oral   Chew 1 tablet by mouth daily.         Marland Kitchen FLUTICASONE-SALMETEROL 100-50 MCG/DOSE IN AEPB   Inhalation   Inhale 1 puff into the lungs every 12 (twelve) hours.           . IBUPROFEN 800 MG PO TABS   Oral   Take 800 mg by mouth every 8 (eight) hours as needed. pain         . METOCLOPRAMIDE HCL 10 MG PO TABS   Oral   Take 1 tablet (10 mg total) by mouth every 6 (six) hours.   15 tablet   0   . METOCLOPRAMIDE HCL 10 MG PO TABS   Oral   Take 10 mg by mouth daily as needed. Nausea         . MONTELUKAST SODIUM 10 MG PO TABS   Oral   Take 10 mg by mouth at bedtime.         Marland Kitchen ZOLPIDEM TARTRATE 5 MG PO TABS   Oral   Take 5 mg by mouth at bedtime.  BP 131/83  Pulse 84  Temp 97.6 F (36.4 C) (Oral)  Resp 18  SpO2 100%  LMP 04/07/2012  Physical Exam  Nursing note and vitals reviewed. Constitutional: She is oriented to person, place, and time. She appears well-developed and well-nourished. No distress.  Musculoskeletal: Normal range of motion. She exhibits tenderness (tenderness with palpation over medial aspect of her left patella. There is no joint line tenderness, anterior and posterior drawer or negative. No varus or valgus laxity to stress. Negative Lockman's). She exhibits no edema.       Left knee without edema, effusion, notable deformity. She does have a nickel-sized bruise to the superior portion of her knee above the patella that appears older. No crepitus to palpation no step-off  Neurological: She is alert and oriented to person, place, and time. She displays normal reflexes. She exhibits normal muscle tone. Coordination normal.  Skin: Skin is warm and dry. No rash noted. No erythema. No pallor.       Scattered bruising in varying ages across lower extremities    ED Course  Procedures (including critical care time)  Labs Reviewed - No data to display Dg Knee Complete 4 Views  Left  04/13/2012  *RADIOLOGY REPORT*  Clinical Data: Status post fall; left knee pain.  LEFT KNEE - COMPLETE 4+ VIEW  Comparison: None.  Findings: There is no definite evidence of fracture or dislocation. Slightly irregular sclerosis along the lateral tibial plateau is nonspecific in appearance.  The joint spaces are preserved.  No significant degenerative change is seen; the patellofemoral joint is grossly unremarkable in appearance.  No significant joint effusion is seen.  The visualized soft tissues are normal in appearance.  IMPRESSION: No definite evidence of fracture or dislocation.  Slightly irregular sclerosis along the lateral tibial plateau is nonspecific, and mild trabecular bone injury cannot be entirely excluded.  If the patient's symptoms persist or worsen, MRI could be considered for further evaluation.   Original Report Authenticated By: Tonia Ghent, M.D.      1. Fall   2. Knee pain, left       MDM  21 year old female status post fall on paper towels here in the hospital. We'll get x-rays of the knee. Her knee exam is reassuring in that she has no ligamentous laxity. She has been able to ambulate on the knee. She does report some lateral buckling, but again her ligaments seem intact. She indicates most of her pain to the medial aspect of the patella. We'll give Motrin here. We'll give crutches as needed and have her followup with her primary care Dr. if x-rays are negative.        Olivia Mackie, MD 04/13/12 [phone]  I observed the patient ambulating in the emergency department. There is no signs of instability at the knee joint. She does report a lot of pain in the area. She reports she does not have crutches. Will continue the ibuprofen, given ice pack and she is going back upstairs and provide crutches. X-ray shows some irregular sclerosis of the lateral aspect of the tibial plateau, the patient does not have pain over this area.  Olivia Mackie, MD 04/13/12 [phone]

## 2012-04-13 NOTE — ED Notes (Signed)
Pt. Back from xray via stretcher.

## 2012-04-13 NOTE — ED Notes (Signed)
Patient transported to X-ray 

## 2012-04-13 NOTE — ED Notes (Addendum)
Pt. States she was in the bathroom and stepped on some paper towels and slipped and fell. States she came directly down on her left knee. No swelling noted. Has new bruise just above patella and some older bruises seen just below knee. Pt. Is able to stand and bear weight on extremity.

## 2012-04-13 NOTE — ED Notes (Signed)
Spoke to ortho tech will bring Pt. Crutches.

## 2012-06-02 ENCOUNTER — Emergency Department (HOSPITAL_COMMUNITY): Payer: Medicaid Other

## 2012-06-02 ENCOUNTER — Encounter (HOSPITAL_COMMUNITY): Payer: Self-pay | Admitting: *Deleted

## 2012-06-02 ENCOUNTER — Emergency Department (HOSPITAL_COMMUNITY)
Admission: EM | Admit: 2012-06-02 | Discharge: 2012-06-02 | Disposition: A | Discharge status: Home / Self Care | Payer: Medicaid Other | Attending: Emergency Medicine | Admitting: Emergency Medicine

## 2012-06-02 DIAGNOSIS — J45909 Unspecified asthma, uncomplicated: Secondary | ICD-10-CM | POA: Insufficient documentation

## 2012-06-02 DIAGNOSIS — R0789 Other chest pain: Secondary | ICD-10-CM | POA: Insufficient documentation

## 2012-06-02 DIAGNOSIS — Z8659 Personal history of other mental and behavioral disorders: Secondary | ICD-10-CM | POA: Insufficient documentation

## 2012-06-02 DIAGNOSIS — R05 Cough: Secondary | ICD-10-CM | POA: Insufficient documentation

## 2012-06-02 DIAGNOSIS — R6889 Other general symptoms and signs: Secondary | ICD-10-CM | POA: Insufficient documentation

## 2012-06-02 DIAGNOSIS — R059 Cough, unspecified: Secondary | ICD-10-CM | POA: Insufficient documentation

## 2012-06-02 DIAGNOSIS — Z79899 Other long term (current) drug therapy: Secondary | ICD-10-CM | POA: Insufficient documentation

## 2012-06-02 DIAGNOSIS — IMO0001 Reserved for inherently not codable concepts without codable children: Secondary | ICD-10-CM | POA: Insufficient documentation

## 2012-06-02 DIAGNOSIS — R509 Fever, unspecified: Secondary | ICD-10-CM | POA: Insufficient documentation

## 2012-06-02 DIAGNOSIS — F17200 Nicotine dependence, unspecified, uncomplicated: Secondary | ICD-10-CM | POA: Insufficient documentation

## 2012-06-02 DIAGNOSIS — J3489 Other specified disorders of nose and nasal sinuses: Secondary | ICD-10-CM | POA: Insufficient documentation

## 2012-06-02 MED ORDER — GUAIFENESIN-CODEINE 100-10 MG/5ML PO SYRP: 10.0000 mL | ORAL_SOLUTION | Freq: Three times a day (TID) | ORAL | Status: AC | PRN

## 2012-06-02 NOTE — ED Provider Notes (Signed)
History     CSN: [id number]  Arrival date & time 06/02/12  1218   First MD Initiated Contact with Patient 06/02/12 1426      Chief Complaint  Patient presents with  . Cough    (Consider location/radiation/quality/duration/timing/severity/associated sxs/prior treatment) Patient is a 22 y.o. female presenting with cough. The history is provided by the patient.  Cough This is a new problem. The current episode started more than 2 days ago. The problem occurs every few minutes. The problem has not changed since onset.The cough is productive of sputum. The maximum temperature recorded prior to her arrival was 101 to 101.9 F. The fever has been present for 3 to 4 days. Associated symptoms include myalgias. Pertinent negatives include no chest pain, no chills, no sweats, no ear congestion, no ear pain, no headaches, no rhinorrhea, no sore throat, no shortness of breath, no wheezing and no eye redness. Associated symptoms comments: Chest congestion, nasal congestion . She has tried nothing for the symptoms. The treatment provided no relief. She is a smoker. Her past medical history is significant for asthma. Her past medical history does not include bronchitis or pneumonia.    Past Medical History  Diagnosis Date  . Asthma   . Bipolar 1 disorder     Past Surgical History  Procedure Date  . Cholecystectomy   . Myringotomy     History reviewed. No pertinent family history.  History  Substance Use Topics  . Smoking status: Current Every Day Smoker -- 1.0 packs/day for 8 years    Types: Cigarettes  . Smokeless tobacco: Not on file  . Alcohol Use: No    OB History    Grav Para Term Preterm Abortions TAB SAB Ect Mult Living   1               Review of Systems  Constitutional: Negative for fever, chills, activity change, appetite change and fatigue.  HENT: Positive for congestion and sneezing. Negative for ear pain, sore throat, rhinorrhea, trouble swallowing, neck pain and neck  stiffness.   Eyes: Negative for redness.  Respiratory: Positive for cough and chest tightness. Negative for shortness of breath and wheezing.   Cardiovascular: Negative for chest pain and palpitations.  Gastrointestinal: Negative for nausea, vomiting, abdominal pain and blood in stool.  Genitourinary: Negative for dysuria, hematuria and flank pain.  Musculoskeletal: Positive for myalgias. Negative for back pain and arthralgias.  Skin: Negative for rash.  Neurological: Negative for dizziness, weakness, numbness and headaches.  Hematological: Does not bruise/bleed easily.  All other systems reviewed and are negative.    Allergies  Yellow jacket venom; Ketorolac tromethamine; Penicillins; Tramadol; and Vistaril  Home Medications   Current Outpatient Rx  Name  Route  Sig  Dispense  Refill  . ACETAMINOPHEN 500 MG PO TABS   Oral   Take 1,000 mg by mouth every 6 (six) hours as needed. For pain         . ALBUTEROL SULFATE HFA 108 (90 BASE) MCG/ACT IN AERS   Inhalation   Inhale 2 puffs into the lungs every 4 (four) hours as needed. For shortness of breath          . FLINTSTONES COMPLETE 60 MG PO CHEW   Oral   Chew 1 tablet by mouth daily.         Marland Kitchen FLUTICASONE-SALMETEROL 100-50 MCG/DOSE IN AEPB   Inhalation   Inhale 1 puff into the lungs every 12 (twelve) hours.           Marland Kitchen  GUAIFENESIN ER 600 MG PO TB12   Oral   Take 600 mg by mouth 2 (two) times daily.         . GUAIFENESIN 100 MG/5ML PO SYRP   Oral   Take 200 mg by mouth 3 (three) times daily as needed. For cough         . IBUPROFEN 800 MG PO TABS   Oral   Take 1 tablet (800 mg total) by mouth every 8 (eight) hours as needed. pain   30 tablet   0   . MEDROXYPROGESTERONE ACETATE 150 MG/ML IM SUSP   Intramuscular   Inject 150 mg into the muscle every 3 (three) months.         Marland Kitchen METOCLOPRAMIDE HCL 10 MG PO TABS   Oral   Take 10 mg by mouth daily as needed. Nausea         . MONTELUKAST SODIUM 10 MG PO  TABS   Oral   Take 10 mg by mouth at bedtime.         Marland Kitchen PSEUDOEPH-DOXYLAMINE-DM-APAP 60-7.10-09-998 MG/30ML PO LIQD   Oral   Take 30 mLs by mouth at bedtime.         Marland Kitchen PSEUDOEPHEDRINE-APAP-DM 16-109-60 MG/30ML PO LIQD   Oral   Take 30 mLs by mouth daily.         Marland Kitchen ZOLPIDEM TARTRATE 5 MG PO TABS   Oral   Take 5 mg by mouth at bedtime.          Marland Kitchen METOCLOPRAMIDE HCL 10 MG PO TABS   Oral   Take 1 tablet (10 mg total) by mouth every 6 (six) hours.   15 tablet   0     BP 124/51  Pulse 71  Temp 97.5 F (36.4 C) (Oral)  Resp 20  Ht 5\' 3"  (1.6 m)  Wt 145 lb (65.772 kg)  BMI 25.69 kg/m2  SpO2 98%  LMP 05/30/2012  Physical Exam  Nursing note and vitals reviewed. Constitutional: She is oriented to person, place, and time. She appears well-developed and well-nourished. No distress.  HENT:  Head: Normocephalic and atraumatic.  Right Ear: Tympanic membrane and ear canal normal.  Left Ear: Tympanic membrane and ear canal normal.  Mouth/Throat: Uvula is midline, oropharynx is clear and moist and mucous membranes are normal. No oropharyngeal exudate.  Eyes: EOM are normal. Pupils are equal, round, and reactive to light.  Neck: Normal range of motion. Neck supple.  Cardiovascular: Normal rate, regular rhythm, normal heart sounds and intact distal pulses.   No murmur heard. Pulmonary/Chest: Effort normal. No respiratory distress. She has no rales. She exhibits no tenderness.       Coarse lungs sounds bilaterally.    Musculoskeletal: She exhibits no edema.  Lymphadenopathy:    She has no cervical adenopathy.  Neurological: She is alert and oriented to person, place, and time. She exhibits normal muscle tone. Coordination normal.  Skin: Skin is warm and dry.    ED Course  Procedures (including critical care time)  Labs Reviewed - No data to display Dg Chest 2 View  06/02/2012  *RADIOLOGY REPORT*  Clinical Data: Cough, chest pain, right rib pain  CHEST - 2 VIEW   Comparison: 09/05/2009  Findings: Normal heart size, mediastinal contours, and pulmonary vascularity. Minimal chronic peribronchial thickening. No pulmonary infiltrate, pleural effusion or pneumothorax. Surgical clips right upper quadrant question cholecystectomy. No acute osseous findings.  IMPRESSION: Minimal chronic bronchitic changes. No acute abnormalities.   Original Report Authenticated  By: Ulyses Southward, M.D.         MDM    Patient is alert, non-toxic appearing. She is currently taking clindamycin.  Has inhaler and ibuprofen at home.  Sx's likely viral, will treat with robitussin AC.  Advised her to encourage fluids and f/u with her pmd if needed.      Alon Mazor L. Trisha Mangle, [place] 06/04/12 2008

## 2012-06-02 NOTE — ED Notes (Signed)
Cough , chest sore. Green sputum.  Had fever for 3 days , none now.

## 2012-06-07 NOTE — ED Provider Notes (Signed)
Medical screening examination/treatment/procedure(s) were performed by non-physician practitioner and as supervising physician I was immediately available for consultation/collaboration.   Benny Lennert, MD 06/07/12 [phone]

## 2012-09-16 ENCOUNTER — Emergency Department (HOSPITAL_COMMUNITY)
Admission: EM | Admit: 2012-09-16 | Discharge: 2012-09-16 | Disposition: A | Discharge status: Home / Self Care | Payer: Medicaid Other | Attending: Emergency Medicine | Admitting: Emergency Medicine

## 2012-09-16 ENCOUNTER — Encounter (HOSPITAL_COMMUNITY): Payer: Self-pay

## 2012-09-16 DIAGNOSIS — K121 Other forms of stomatitis: Secondary | ICD-10-CM

## 2012-09-16 DIAGNOSIS — K122 Cellulitis and abscess of mouth: Secondary | ICD-10-CM

## 2012-09-16 DIAGNOSIS — J45909 Unspecified asthma, uncomplicated: Secondary | ICD-10-CM | POA: Insufficient documentation

## 2012-09-16 DIAGNOSIS — F319 Bipolar disorder, unspecified: Secondary | ICD-10-CM | POA: Insufficient documentation

## 2012-09-16 DIAGNOSIS — F17200 Nicotine dependence, unspecified, uncomplicated: Secondary | ICD-10-CM | POA: Insufficient documentation

## 2012-09-16 DIAGNOSIS — IMO0002 Reserved for concepts with insufficient information to code with codable children: Secondary | ICD-10-CM | POA: Insufficient documentation

## 2012-09-16 DIAGNOSIS — K1370 Unspecified lesions of oral mucosa: Secondary | ICD-10-CM | POA: Insufficient documentation

## 2012-09-16 DIAGNOSIS — Z79899 Other long term (current) drug therapy: Secondary | ICD-10-CM | POA: Insufficient documentation

## 2012-09-16 MED ORDER — LIDOCAINE VISCOUS 2 % MT SOLN: 20.0000 mL | OROMUCOSAL | Status: DC | PRN

## 2012-09-16 MED ORDER — CLINDAMYCIN HCL 150 MG PO CAPS: 150.0000 mg | ORAL_CAPSULE | Freq: Four times a day (QID) | ORAL | Status: DC

## 2012-09-16 MED ORDER — LIDOCAINE VISCOUS 2 % MT SOLN
OROMUCOSAL | Status: AC
  Filled 2012-09-16: qty 15

## 2012-09-16 MED ORDER — HYDROCODONE-ACETAMINOPHEN 5-325 MG PO TABS: 1.0000 | ORAL_TABLET | ORAL | Status: DC | PRN

## 2012-09-16 NOTE — ED Notes (Signed)
Pt cut to lower gums after trying to eat a hamburger the other day, tried Orajel without relief

## 2012-09-16 NOTE — ED Notes (Signed)
Pt reports has ulcer on lower gums under dentures.  Says she ate something the other day and felt it cut her lip.

## 2012-09-16 NOTE — ED Provider Notes (Signed)
History     CSN: [id number]  Arrival date & time 09/16/12  1447   First MD Initiated Contact with Patient 09/16/12 1511      Chief Complaint  Patient presents with  . Oral Swelling    (Consider location/radiation/quality/duration/timing/severity/associated sxs/prior treatment) HPI Brittany Payne is a 22 y.o. female who presents to the ED with painful gums. She noted ulcers under her dentures of the lower gums. She thinks it is due to when she ate a hamburger and the dentures pushed into the gum. She has been using Beatrix Fetters without relief. Today the area is more swollen and tender. She has only had the dentures a short time and this was the first time she ate anything that wasn't soft.  The history was provided by the patient.  Past Medical History  Diagnosis Date  . Asthma   . Bipolar 1 disorder     Past Surgical History  Procedure Laterality Date  . Cholecystectomy    . Myringotomy      No family history on file.  History  Substance Use Topics  . Smoking status: Current Every Day Smoker -- 1.00 packs/day for 8 years    Types: Cigarettes  . Smokeless tobacco: Not on file  . Alcohol Use: No    OB History   Grav Para Term Preterm Abortions TAB SAB Ect Mult Living   1               Review of Systems  Constitutional: Negative for fever.  HENT: Positive for dental problem. Negative for neck pain.   Gastrointestinal: Negative for nausea and vomiting.  Musculoskeletal: Negative for back pain.  Skin: Negative for rash.  Allergic/Immunologic: Negative for food allergies.  Psychiatric/Behavioral: The patient is not nervous/anxious.     Allergies  Yellow jacket venom; Ketorolac tromethamine; Penicillins; Tramadol; and Vistaril  Home Medications   Current Outpatient Rx  Name  Route  Sig  Dispense  Refill  . acetaminophen (TYLENOL) 500 MG tablet   Oral   Take 1,000 mg by mouth every 6 (six) hours as needed. For pain         . albuterol (VENTOLIN HFA) 108 (90 BASE)  MCG/ACT inhaler   Inhalation   Inhale 2 puffs into the lungs every 4 (four) hours as needed. For shortness of breath          . flintstones complete (FLINTSTONES) 60 MG chewable tablet   Oral   Chew 1 tablet by mouth daily.         . Fluticasone-Salmeterol (ADVAIR) 100-50 MCG/DOSE AEPB   Inhalation   Inhale 1 puff into the lungs every 12 (twelve) hours.           Marland Kitchen ibuprofen (ADVIL,MOTRIN) 800 MG tablet   Oral   Take 1 tablet (800 mg total) by mouth every 8 (eight) hours as needed. pain   30 tablet   0   . montelukast (SINGULAIR) 10 MG tablet   Oral   Take 10 mg by mouth at bedtime.         . promethazine (PHENERGAN) 25 MG tablet   Oral   Take 25 mg by mouth every 6 (six) hours as needed for nausea.         . sertraline (ZOLOFT) 25 MG tablet   Oral   Take 25 mg by mouth 2 (two) times daily.         Marland Kitchen zolpidem (AMBIEN) 5 MG tablet   Oral   Take 5  mg by mouth at bedtime.            BP 124/76  Pulse 111  Temp(Src) 99 F (37.2 C) (Oral)  Resp 18  Ht 5\' 1"  (1.549 m)  Wt 137 lb (62.143 kg)  BMI 25.9 kg/m2  SpO2 99%  Physical Exam  Nursing note and vitals reviewed. Constitutional: She is oriented to person, place, and time. She appears well-developed and well-nourished. No distress.  HENT:  Head: Normocephalic.  Mouth/Throat: Uvula is midline and oropharynx is clear and moist.    Laceration noted to lower gum area. There is swelling and an area of drainage that appears to be infected.  Eyes: EOM are normal.  Neck: Neck supple.  Cardiovascular: Tachycardia present.   Pulmonary/Chest: Effort normal.  Musculoskeletal: Normal range of motion.  Neurological: She is alert and oriented to person, place, and time. No cranial nerve deficit.  Skin: Skin is warm and dry.  Psychiatric: She has a normal mood and affect. Her behavior is normal. Judgment and thought content normal.    ED Course  Procedures (including critical care time) Assessment: 22 y.o.  female with laceration to lower gum due to dentures   Early gum infection  Plan:  Antibiotics, pain management   Lidocaine viscous  MDM  Discussed with the patient clinical findings and plan of care and all questioned fully answered   Medication List    TAKE these medications       clindamycin 150 MG capsule  Commonly known as:  CLEOCIN  Take 1 capsule (150 mg total) by mouth every 6 (six) hours.     HYDROcodone-acetaminophen 5-325 MG per tablet  Commonly known as:  NORCO/VICODIN  Take 1 tablet by mouth every 4 (four) hours as needed.      ASK your doctor about these medications       acetaminophen 500 MG tablet  Commonly known as:  TYLENOL  Take 1,000 mg by mouth every 6 (six) hours as needed. For pain     flintstones complete 60 MG chewable tablet  Chew 1 tablet by mouth daily.     Fluticasone-Salmeterol 100-50 MCG/DOSE Aepb  Commonly known as:  ADVAIR  Inhale 1 puff into the lungs every 12 (twelve) hours.     ibuprofen 800 MG tablet  Commonly known as:  ADVIL,MOTRIN  Take 1 tablet (800 mg total) by mouth every 8 (eight) hours as needed. pain     montelukast 10 MG tablet  Commonly known as:  SINGULAIR  Take 10 mg by mouth at bedtime.     promethazine 25 MG tablet  Commonly known as:  PHENERGAN  Take 25 mg by mouth every 6 (six) hours as needed for nausea.     sertraline 25 MG tablet  Commonly known as:  ZOLOFT  Take 25 mg by mouth 2 (two) times daily.     VENTOLIN HFA 108 (90 BASE) MCG/ACT inhaler  Generic drug:  albuterol  Inhale 2 puffs into the lungs every 4 (four) hours as needed. For shortness of breath     zolpidem 5 MG tablet  Commonly known as:  AMBIEN  Take 5 mg by mouth at bedtime.                [facility] Orlene Och, [place] 09/16/12 [phone]

## 2012-09-18 NOTE — ED Provider Notes (Signed)
Medical screening examination/treatment/procedure(s) were performed by non-physician practitioner and as supervising physician I was immediately available for consultation/collaboration.   Benny Lennert, MD 09/18/12 1213

## 2012-11-19 ENCOUNTER — Emergency Department (HOSPITAL_COMMUNITY)
Admission: EM | Admit: 2012-11-19 | Discharge: 2012-11-19 | Disposition: A | Discharge status: Home / Self Care | Payer: Self-pay | Attending: Emergency Medicine | Admitting: Emergency Medicine

## 2012-11-19 ENCOUNTER — Encounter (HOSPITAL_COMMUNITY): Payer: Self-pay | Admitting: Emergency Medicine

## 2012-11-19 DIAGNOSIS — L738 Other specified follicular disorders: Secondary | ICD-10-CM | POA: Insufficient documentation

## 2012-11-19 DIAGNOSIS — F319 Bipolar disorder, unspecified: Secondary | ICD-10-CM | POA: Insufficient documentation

## 2012-11-19 DIAGNOSIS — Z79899 Other long term (current) drug therapy: Secondary | ICD-10-CM | POA: Insufficient documentation

## 2012-11-19 DIAGNOSIS — Z880 Allergy status to penicillin: Secondary | ICD-10-CM | POA: Insufficient documentation

## 2012-11-19 DIAGNOSIS — R21 Rash and other nonspecific skin eruption: Secondary | ICD-10-CM | POA: Insufficient documentation

## 2012-11-19 DIAGNOSIS — H6092 Unspecified otitis externa, left ear: Secondary | ICD-10-CM

## 2012-11-19 DIAGNOSIS — R231 Pallor: Secondary | ICD-10-CM | POA: Insufficient documentation

## 2012-11-19 DIAGNOSIS — F17200 Nicotine dependence, unspecified, uncomplicated: Secondary | ICD-10-CM | POA: Insufficient documentation

## 2012-11-19 DIAGNOSIS — J45909 Unspecified asthma, uncomplicated: Secondary | ICD-10-CM | POA: Insufficient documentation

## 2012-11-19 DIAGNOSIS — L739 Follicular disorder, unspecified: Secondary | ICD-10-CM

## 2012-11-19 DIAGNOSIS — H60399 Other infective otitis externa, unspecified ear: Secondary | ICD-10-CM | POA: Insufficient documentation

## 2012-11-19 MED ORDER — ANTIPYRINE-BENZOCAINE 5.4-1.4 % OT SOLN
OTIC | Status: AC
  Administered 2012-11-19: 4 [drp]
  Filled 2012-11-19: qty 10

## 2012-11-19 MED ORDER — NEOMYCIN-POLYMYXIN-HC 3.5-10000-1 OT SUSP: 4.0000 [drp] | Freq: Three times a day (TID) | OTIC | Status: DC

## 2012-11-19 MED ORDER — MUPIROCIN CALCIUM 2 % EX CREA: TOPICAL_CREAM | Freq: Three times a day (TID) | CUTANEOUS | Status: DC

## 2012-11-19 NOTE — ED Notes (Signed)
Pt c/o left earache and rash to buttocks since swimming on 7/4.

## 2012-11-19 NOTE — ED Notes (Signed)
Went into room to assess pt. Pt is currently sleeping.

## 2012-11-19 NOTE — ED Provider Notes (Signed)
History    CSN: [id number] Arrival date & time 11/19/12  1324  First MD Initiated Contact with Patient 11/19/12 0830     Chief Complaint  Patient presents with  . Otalgia  . Rash   (Consider location/radiation/quality/duration/timing/severity/associated sxs/prior Treatment) HPI Comments: Brittany Payne is a 22 y.o. Female presenting with a six-day history of left ear pain which has started to drain clear fluid yesterday.  She describes aching left ear pain which is constant without relief.  She has taken Tylenol with no relief of this symptom.  Additionally with complaints of rash to bilateral buttocks since she went swimming in a swimming pool 6 days ago.  The rash is itchy but also tender.  There is been no drainage from this rash, no swelling or surrounding redness.  She denies fevers or chills, headache, nasal congestion, sore throat cough or other rash except localized to buttocks area  The history is provided by the patient.   Past Medical History  Diagnosis Date  . Asthma   . Bipolar 1 disorder    Past Surgical History  Procedure Laterality Date  . Cholecystectomy    . Myringotomy     No family history on file. History  Substance Use Topics  . Smoking status: Current Every Day Smoker -- 1.00 packs/day for 8 years    Types: Cigarettes  . Smokeless tobacco: Not on file  . Alcohol Use: No   OB History   Grav Para Term Preterm Abortions TAB SAB Ect Mult Living   1              Review of Systems  Constitutional: Negative for fever.  HENT: Positive for ear pain and ear discharge. Negative for hearing loss, congestion, sore throat, neck pain and tinnitus.   Eyes: Negative.   Respiratory: Negative for chest tightness and shortness of breath.   Cardiovascular: Negative for chest pain.  Gastrointestinal: Negative for nausea and abdominal pain.  Genitourinary: Negative.   Musculoskeletal: Negative for joint swelling and arthralgias.  Skin: Positive for pallor. Negative  for rash and wound.  Neurological: Negative for dizziness, weakness, light-headedness, numbness and headaches.  Psychiatric/Behavioral: Negative.     Allergies  Yellow jacket venom; Ketorolac tromethamine; Penicillins; Tramadol; and Vistaril  Home Medications   Current Outpatient Rx  Name  Route  Sig  Dispense  Refill  . acetaminophen (TYLENOL) 500 MG tablet   Oral   Take 1,000 mg by mouth every 6 (six) hours as needed. For pain         . albuterol (VENTOLIN HFA) 108 (90 BASE) MCG/ACT inhaler   Inhalation   Inhale 2 puffs into the lungs every 4 (four) hours as needed. For shortness of breath          . flintstones complete (FLINTSTONES) 60 MG chewable tablet   Oral   Chew 1 tablet by mouth daily.         . Fluticasone-Salmeterol (ADVAIR) 100-50 MCG/DOSE AEPB   Inhalation   Inhale 1 puff into the lungs every 12 (twelve) hours.           Marland Kitchen ibuprofen (ADVIL,MOTRIN) 800 MG tablet   Oral   Take 1 tablet (800 mg total) by mouth every 8 (eight) hours as needed. pain   30 tablet   0   . montelukast (SINGULAIR) 10 MG tablet   Oral   Take 10 mg by mouth at bedtime.         . sertraline (ZOLOFT) 25 MG  tablet   Oral   Take 25 mg by mouth 2 (two) times daily.         Marland Kitchen zolpidem (AMBIEN) 5 MG tablet   Oral   Take 5 mg by mouth at bedtime.          . mupirocin cream (BACTROBAN) 2 %   Topical   Apply topically 3 (three) times daily.   15 g   0   . neomycin-polymyxin-hydrocortisone (CORTISPORIN) 3.5-10000-1 otic suspension   Left Ear   Place 4 drops into the left ear 3 (three) times daily.   10 mL   0    BP 122/64  Pulse 89  Temp(Src) 98.2 F (36.8 C) (Oral)  Resp 18  Ht 5\' 1"  (1.549 m)  Wt 141 lb (63.957 kg)  BMI 26.66 kg/m2  SpO2 100%  LMP 11/12/2012 Physical Exam  Constitutional: She appears well-developed and well-nourished. No distress.  HENT:  Head: Normocephalic.  Left Ear: Tympanic membrane normal. There is drainage and tenderness. No  mastoid tenderness. Tympanic membrane is not injected. No hemotympanum.  Mild external canal swelling, TM adequately visualized.  She also has a small papule on her external ear which is tender, nondraining.  Neck: Neck supple.  Cardiovascular: Normal rate.   Pulmonary/Chest: Effort normal. She has no wheezes.  Musculoskeletal: Normal range of motion. She exhibits no edema.  Skin: Rash noted.  Scattered small papules on bilateral buttocks.  There are no pustules, no induration or drainage.  No fluctuance.  There are several areas of excoriation.    ED Course  Procedures (including critical care time) Labs Reviewed - No data to display No results found. 1. Otitis externa of left ear   2. Folliculitis     MDM  Patient was prescribed Cortisporin, given Auralgan drops for ear pain.  Mupirocin ointment prescribed for her folliculitis.  She was encouraged Motrin or Tylenol for pain relief.  Followup with her PCP if not improving over the next week.  Burgess Amor, PA-C 11/19/12 [phone]

## 2012-11-23 ENCOUNTER — Encounter (HOSPITAL_COMMUNITY): Payer: Self-pay | Admitting: Emergency Medicine

## 2012-11-23 ENCOUNTER — Emergency Department (HOSPITAL_COMMUNITY)
Admission: EM | Admit: 2012-11-23 | Discharge: 2012-11-23 | Disposition: A | Discharge status: Home / Self Care | Payer: Self-pay | Attending: Emergency Medicine | Admitting: Emergency Medicine

## 2012-11-23 DIAGNOSIS — J45909 Unspecified asthma, uncomplicated: Secondary | ICD-10-CM | POA: Insufficient documentation

## 2012-11-23 DIAGNOSIS — H9209 Otalgia, unspecified ear: Secondary | ICD-10-CM | POA: Insufficient documentation

## 2012-11-23 DIAGNOSIS — H109 Unspecified conjunctivitis: Secondary | ICD-10-CM | POA: Insufficient documentation

## 2012-11-23 DIAGNOSIS — IMO0002 Reserved for concepts with insufficient information to code with codable children: Secondary | ICD-10-CM | POA: Insufficient documentation

## 2012-11-23 DIAGNOSIS — H9202 Otalgia, left ear: Secondary | ICD-10-CM

## 2012-11-23 DIAGNOSIS — H5710 Ocular pain, unspecified eye: Secondary | ICD-10-CM | POA: Insufficient documentation

## 2012-11-23 DIAGNOSIS — F17200 Nicotine dependence, unspecified, uncomplicated: Secondary | ICD-10-CM | POA: Insufficient documentation

## 2012-11-23 DIAGNOSIS — Z880 Allergy status to penicillin: Secondary | ICD-10-CM | POA: Insufficient documentation

## 2012-11-23 DIAGNOSIS — F319 Bipolar disorder, unspecified: Secondary | ICD-10-CM | POA: Insufficient documentation

## 2012-11-23 DIAGNOSIS — Z79899 Other long term (current) drug therapy: Secondary | ICD-10-CM | POA: Insufficient documentation

## 2012-11-23 DIAGNOSIS — J3489 Other specified disorders of nose and nasal sinuses: Secondary | ICD-10-CM | POA: Insufficient documentation

## 2012-11-23 DIAGNOSIS — H9210 Otorrhea, unspecified ear: Secondary | ICD-10-CM | POA: Insufficient documentation

## 2012-11-23 DIAGNOSIS — Z9889 Other specified postprocedural states: Secondary | ICD-10-CM | POA: Insufficient documentation

## 2012-11-23 DIAGNOSIS — H9190 Unspecified hearing loss, unspecified ear: Secondary | ICD-10-CM | POA: Insufficient documentation

## 2012-11-23 MED ORDER — TOBRAMYCIN 0.3 % OP SOLN
2.0000 [drp] | Freq: Once | OPHTHALMIC | Status: AC
  Administered 2012-11-23: 2 [drp] via OPHTHALMIC
  Filled 2012-11-23: qty 5

## 2012-11-23 MED ORDER — AZITHROMYCIN 250 MG PO TABS
500.0000 mg | ORAL_TABLET | Freq: Once | ORAL | Status: AC
  Administered 2012-11-23: 500 mg via ORAL
  Filled 2012-11-23: qty 2

## 2012-11-23 MED ORDER — AZITHROMYCIN 250 MG PO TABS: ORAL_TABLET | ORAL | Status: DC

## 2012-11-23 NOTE — ED Provider Notes (Signed)
History    CSN: [id number] Arrival date & time 11/23/12  0800  First MD Initiated Contact with Patient 11/23/12 [phone]     Chief Complaint  Patient presents with  . Otalgia  . Eye Pain   (Consider location/radiation/quality/duration/timing/severity/associated sxs/prior Treatment) Patient is a 22 y.o. female presenting with ear pain. The history is provided by the patient.  Otalgia Location:  Left Behind ear:  No abnormality Quality:  Aching Severity:  Moderate Onset quality:  Gradual Duration:  4 days Timing:  Constant Progression:  Unchanged Chronicity:  New Context: direct blow and water   Relieved by:  Nothing Worsened by:  Nothing tried Ineffective treatments: cortisporin ear soln. Associated symptoms: congestion and hearing loss   Associated symptoms: no abdominal pain, no cough, no fever, no headaches, no neck pain, no rash, no rhinorrhea, no sore throat and no vomiting   Associated symptoms comment:  Redness and drainage to left eye.  Past Medical History  Diagnosis Date  . Asthma   . Bipolar 1 disorder    Past Surgical History  Procedure Laterality Date  . Cholecystectomy    . Myringotomy     History reviewed. No pertinent family history. History  Substance Use Topics  . Smoking status: Current Every Day Smoker -- 1.00 packs/day for 8 years    Types: Cigarettes  . Smokeless tobacco: Not on file  . Alcohol Use: No   OB History   Grav Para Term Preterm Abortions TAB SAB Ect Mult Living   1         1     Review of Systems  Constitutional: Negative for fever, chills, activity change and appetite change.  HENT: Positive for hearing loss, ear pain and congestion. Negative for sore throat, facial swelling, rhinorrhea, trouble swallowing, neck pain and neck stiffness.   Eyes: Positive for pain, discharge and redness. Negative for photophobia and visual disturbance.  Respiratory: Negative for cough, shortness of breath, wheezing and stridor.    Gastrointestinal: Negative for nausea, vomiting and abdominal pain.  Skin: Negative.  Negative for rash.  Neurological: Negative for dizziness, syncope, speech difficulty, weakness, numbness and headaches.  Hematological: Negative for adenopathy.  Psychiatric/Behavioral: Negative for confusion.  All other systems reviewed and are negative.    Allergies  Yellow jacket venom; Amoxicillin; Ketorolac tromethamine; Penicillins; Tramadol; and Vistaril  Home Medications   Current Outpatient Rx  Name  Route  Sig  Dispense  Refill  . acetaminophen (TYLENOL) 500 MG tablet   Oral   Take 1,000 mg by mouth every 6 (six) hours as needed. For pain         . albuterol (VENTOLIN HFA) 108 (90 BASE) MCG/ACT inhaler   Inhalation   Inhale 2 puffs into the lungs every 4 (four) hours as needed. For shortness of breath         . flintstones complete (FLINTSTONES) 60 MG chewable tablet   Oral   Chew 1 tablet by mouth daily.         . Fluticasone-Salmeterol (ADVAIR) 100-50 MCG/DOSE AEPB   Inhalation   Inhale 1 puff into the lungs every 12 (twelve) hours.           . montelukast (SINGULAIR) 10 MG tablet   Oral   Take 10 mg by mouth at bedtime.         . naproxen sodium (ANAPROX) 220 MG tablet   Oral   Take 220 mg by mouth 2 (two) times daily as needed (pain).         Marland Kitchen  neomycin-polymyxin-hydrocortisone (CORTISPORIN) 3.5-10000-1 otic suspension   Left Ear   Place 4 drops into the left ear 3 (three) times daily.   10 mL   0   . sertraline (ZOLOFT) 25 MG tablet   Oral   Take 25 mg by mouth 2 (two) times daily.         Marland Kitchen zolpidem (AMBIEN) 5 MG tablet   Oral   Take 5 mg by mouth at bedtime.           BP 126/83  Pulse 77  Temp(Src) 98 F (36.7 C) (Oral)  Resp 18  Wt 140 lb (63.504 kg)  BMI 26.47 kg/m2  SpO2 100%  LMP 11/12/2012 Physical Exam  Nursing note and vitals reviewed. Constitutional: She is oriented to person, place, and time. She appears well-developed and  well-nourished. No distress.  HENT:  Head: Normocephalic and atraumatic. No trismus in the jaw.  Right Ear: Tympanic membrane and ear canal normal.  Left Ear: Tympanic membrane and ear canal normal. There is drainage. No mastoid tenderness. No hemotympanum.  Nose: Mucosal edema and rhinorrhea present.  Mouth/Throat: Uvula is midline and mucous membranes are normal. No edematous. No oropharyngeal exudate, posterior oropharyngeal edema, posterior oropharyngeal erythema or tonsillar abscesses.  Left tm not well visualized due to presence of ear drops  Eyes: EOM are normal. Pupils are equal, round, and reactive to light. No foreign bodies found. Left eye exhibits discharge and exudate. Left eye exhibits no chemosis and no hordeolum. No foreign body present in the left eye. Left conjunctiva is injected. Left conjunctiva has no hemorrhage. Left eye exhibits normal extraocular motion and no nystagmus.  Neck: Normal range of motion and phonation normal. Neck supple. No Brudzinski's sign and no Kernig's sign noted.  Cardiovascular: Normal rate, regular rhythm, normal heart sounds and intact distal pulses.   No murmur heard. Pulmonary/Chest: Effort normal and breath sounds normal. No respiratory distress. She has no wheezes. She has no rales.  Abdominal: Soft. Bowel sounds are normal.  Musculoskeletal: She exhibits no edema.  Lymphadenopathy:    She has no cervical adenopathy.  Neurological: She is alert and oriented to person, place, and time. She exhibits normal muscle tone. Coordination normal.  Skin: Skin is warm and dry.    ED Course  Procedures (including critical care time) Labs Reviewed - No data to display   MDM     Previous ed chart reviewed.  Pt return s today reporting no improvement of left ear pain with cortisporin.  No edema of the ear canal.  TM not well visualized due to presence of ear drops.  No mastoid abnml, pt is well appearing, eating crackers.     Sean Malinowski L. Trisha Mangle,  PA-C 11/25/12 2222

## 2012-11-23 NOTE — ED Notes (Signed)
Patient c/o left ear pain. Per patient was seen here on Thursday and given ear drops in which she states makes her ears hurt worse. Patient reports swimming July 4th and having tube in that was ear "ripped out." Patient states that now here left eye burns and she has blurred vision in that eye.

## 2012-11-23 NOTE — ED Provider Notes (Signed)
Medical screening examination/treatment/procedure(s) were performed by non-physician practitioner and as supervising physician I was immediately available for consultation/collaboration.   Olene Godfrey W. Zygmund Passero, MD 11/23/12 1632 

## 2012-11-28 NOTE — ED Provider Notes (Signed)
Medical screening examination/treatment/procedure(s) were performed by non-physician practitioner and as supervising physician I was immediately available for consultation/collaboration.   Cael Worth W. Tarshia Kot, MD 11/28/12 0719 

## 2013-01-16 ENCOUNTER — Emergency Department (HOSPITAL_COMMUNITY)
Admission: EM | Admit: 2013-01-16 | Discharge: 2013-01-16 | Disposition: A | Discharge status: Home / Self Care | Payer: Self-pay | Attending: Emergency Medicine | Admitting: Emergency Medicine

## 2013-01-16 ENCOUNTER — Encounter (HOSPITAL_COMMUNITY): Payer: Self-pay | Admitting: *Deleted

## 2013-01-16 DIAGNOSIS — F319 Bipolar disorder, unspecified: Secondary | ICD-10-CM | POA: Insufficient documentation

## 2013-01-16 DIAGNOSIS — F17200 Nicotine dependence, unspecified, uncomplicated: Secondary | ICD-10-CM | POA: Insufficient documentation

## 2013-01-16 DIAGNOSIS — N390 Urinary tract infection, site not specified: Secondary | ICD-10-CM | POA: Insufficient documentation

## 2013-01-16 DIAGNOSIS — Z79899 Other long term (current) drug therapy: Secondary | ICD-10-CM | POA: Insufficient documentation

## 2013-01-16 DIAGNOSIS — J45909 Unspecified asthma, uncomplicated: Secondary | ICD-10-CM | POA: Insufficient documentation

## 2013-01-16 DIAGNOSIS — Z9089 Acquired absence of other organs: Secondary | ICD-10-CM | POA: Insufficient documentation

## 2013-01-16 DIAGNOSIS — Z3202 Encounter for pregnancy test, result negative: Secondary | ICD-10-CM | POA: Insufficient documentation

## 2013-01-16 LAB — URINALYSIS, ROUTINE W REFLEX MICROSCOPIC
Glucose, UA: NEGATIVE mg/dL
Ketones, ur: NEGATIVE mg/dL
Nitrite: POSITIVE — AB
Specific Gravity, Urine: >1.03 — ABNORMAL HIGH (ref 1.005–1.030)
Urobilinogen, UA: 0.2 mg/dL (ref 0.0–1.0)
pH: 6 (ref 5.0–8.0)

## 2013-01-16 LAB — PREGNANCY, URINE: Preg Test, Ur: NEGATIVE

## 2013-01-16 LAB — URINE MICROSCOPIC-ADD ON

## 2013-01-16 MED ORDER — NITROFURANTOIN MONOHYD MACRO 100 MG PO CAPS: 100.0000 mg | ORAL_CAPSULE | Freq: Two times a day (BID) | ORAL | Status: DC

## 2013-01-16 MED ORDER — OXYCODONE-ACETAMINOPHEN 5-325 MG PO TABS
2.0000 | ORAL_TABLET | Freq: Once | ORAL | Status: AC
  Administered 2013-01-16: 2 via ORAL
  Filled 2013-01-16: qty 2

## 2013-01-16 MED ORDER — NITROFURANTOIN MACROCRYSTAL 100 MG PO CAPS
100.0000 mg | ORAL_CAPSULE | Freq: Once | ORAL | Status: DC
  Filled 2013-01-16: qty 1

## 2013-01-16 MED ORDER — NITROFURANTOIN MONOHYD MACRO 100 MG PO CAPS
ORAL_CAPSULE | ORAL | Status: AC
  Administered 2013-01-16: 100 mg
  Filled 2013-01-16: qty 1

## 2013-01-16 MED ORDER — ONDANSETRON HCL 4 MG/2ML IJ SOLN
4.0000 mg | Freq: Once | INTRAMUSCULAR | Status: AC
  Administered 2013-01-16: 4 mg via INTRAVENOUS
  Filled 2013-01-16: qty 2

## 2013-01-16 NOTE — ED Notes (Signed)
Patient would like something to drink at this time. 

## 2013-01-16 NOTE — ED Provider Notes (Signed)
CSN: [id number]     Arrival date & time 01/16/13  1602 History   First MD Initiated Contact with Patient 01/16/13 1635     Chief Complaint  Patient presents with  . Abdominal Pain   (Consider location/radiation/quality/duration/timing/severity/associated sxs/prior Treatment) HPI  22 year old female with abdominal pain. Left-sided. Crampy in nature. Does not radiate. No appreciable exacerbating or relieving factors. Urinary urgency, otherwise no urinary complaints. No fevers or chills. No nausea or vomiting. No intervention prior to arrival.  Past Medical History  Diagnosis Date  . Asthma   . Bipolar 1 disorder    Past Surgical History  Procedure Laterality Date  . Cholecystectomy    . Myringotomy     No family history on file. History  Substance Use Topics  . Smoking status: Current Every Day Smoker -- 1.00 packs/day for 8 years    Types: Cigarettes  . Smokeless tobacco: Not on file  . Alcohol Use: No   OB History   Grav Para Term Preterm Abortions TAB SAB Ect Mult Living   1         1     Review of Systems  All systems reviewed and negative, other than as noted in HPI.   Allergies  Yellow jacket venom; Amoxicillin; Ketorolac tromethamine; Penicillins; Tramadol; and Vistaril  Home Medications   Current Outpatient Rx  Name  Route  Sig  Dispense  Refill  . acetaminophen (TYLENOL) 500 MG tablet   Oral   Take 1,000 mg by mouth every 6 (six) hours as needed. For pain         . albuterol (VENTOLIN HFA) 108 (90 BASE) MCG/ACT inhaler   Inhalation   Inhale 2 puffs into the lungs every 4 (four) hours as needed. For shortness of breath         . azithromycin (ZITHROMAX) 250 MG tablet      Take two tablets on day one, then one tab qd days 2-5         . flintstones complete (FLINTSTONES) 60 MG chewable tablet   Oral   Chew 1 tablet by mouth daily.         . Fluticasone-Salmeterol (ADVAIR) 100-50 MCG/DOSE AEPB   Inhalation   Inhale 1 puff into the lungs every  12 (twelve) hours.           . montelukast (SINGULAIR) 10 MG tablet   Oral   Take 10 mg by mouth at bedtime.         Marland Kitchen neomycin-polymyxin-hydrocortisone (CORTISPORIN) 3.5-10000-1 otic suspension   Left Ear   Place 4 drops into the left ear 3 (three) times daily.   10 mL   0   . sertraline (ZOLOFT) 25 MG tablet   Oral   Take 25 mg by mouth 2 (two) times daily.         Marland Kitchen zolpidem (AMBIEN) 5 MG tablet   Oral   Take 5 mg by mouth at bedtime.          . nitrofurantoin, macrocrystal-monohydrate, (MACROBID) 100 MG capsule   Oral   Take 1 capsule (100 mg total) by mouth 2 (two) times daily.   10 capsule   0    BP 115/75  Pulse 102  Temp(Src) 98 F (36.7 C) (Oral)  Resp 18  Ht 5\' 1"  (1.549 m)  Wt 140 lb 8 oz (63.73 kg)  BMI 26.56 kg/m2  SpO2 98%  LMP 01/05/2013 Physical Exam  Nursing note and vitals reviewed. Constitutional: She appears well-developed  and well-nourished. No distress.  HENT:  Head: Normocephalic and atraumatic.  Eyes: Conjunctivae are normal. Right eye exhibits no discharge. Left eye exhibits no discharge.  Neck: Neck supple.  Cardiovascular: Normal rate, regular rhythm and normal heart sounds.  Exam reveals no gallop and no friction rub.   No murmur heard. Pulmonary/Chest: Effort normal and breath sounds normal. No respiratory distress.  Abdominal: Soft. She exhibits no distension. There is no tenderness.  Genitourinary:  No cva tenderness  Musculoskeletal: She exhibits no edema and no tenderness.  Neurological: She is alert.  Skin: Skin is warm and dry.  Psychiatric: She has a normal mood and affect. Her behavior is normal. Thought content normal.      ED Course  Procedures (including critical care time) Labs Review Labs Reviewed  URINALYSIS, ROUTINE W REFLEX MICROSCOPIC - Abnormal; Notable for the following:    APPearance CLOUDY (*)    Specific Gravity, Urine >1.030 (*)    Hgb urine dipstick SMALL (*)    Bilirubin Urine SMALL (*)     Protein, ur TRACE (*)    Nitrite POSITIVE (*)    Leukocytes, UA MODERATE (*)    All other components within normal limits  URINE MICROSCOPIC-ADD ON - Abnormal; Notable for the following:    Bacteria, UA MANY (*)    All other components within normal limits  URINE CULTURE  PREGNANCY, URINE   Imaging Review No results found.  MDM   1. UTI (urinary tract infection)    22 year old female with lower elbow pain and dysuria. Urinalysis is consistent with infection. Afebrile and well appearing. I feel she is appropriate for outpatient treatment at this time. Return precautions discussed.    Raeford Razor, MD 01/20/13 1710

## 2013-01-16 NOTE — ED Notes (Signed)
E sig pad not working, patient advised to drink plenty of fluids

## 2013-01-16 NOTE — ED Notes (Signed)
Pt c/o left side abd pain, constipation, last bowel movement was yesterday and pt states that it took her "awhile" to use the restroom, denies any n/v

## 2013-01-19 LAB — URINE CULTURE: Colony Count: >=100000

## 2013-01-20 NOTE — ED Notes (Signed)
Post ED Visit - Positive Culture Follow-up: Successful Patient Follow-Up  Culture assessed and recommendations reviewed by: [x]  Wes Dulaney, Pharm.D., BCPS []  Celedonio Miyamoto, [address].D., BCPS []  Georgina Pillion, Pharm.D., BCPS []  [place], [address].D., BCPS, AAHIVP []  Estella Husk, Pharm.D., BCPS, AAHIVP  Positive urine culture  []  Patient discharged without antimicrobial prescription and treatment is now indicated [x]  Organism is resistant to prescribed ED discharge antimicrobial []  Patient with positive blood cultures  Changes discussed with ED provider: Johnnette Gourd New antibiotic prescription Stop Macrobid-Start Bactrim DS 1 tablet BID x 5 days    Contacted patient: No answer, date 9/10/201, time 1703  Larena Sox 01/20/2013, 5:00 PM

## 2013-01-20 NOTE — Progress Notes (Signed)
ED Antimicrobial Stewardship Positive Culture Follow Up   Brittany Payne is an 22 y.o. female who presented to [facility] on 01/16/2013 with a chief complaint of abdominal pain and dysuria.  Chief Complaint  Patient presents with  . Abdominal Pain    Recent Results (from the past 720 hour(s))  URINE CULTURE     Status: None   Collection Time    01/16/13  5:25 PM      Result Value Range Status   Specimen Description URINE, CLEAN CATCH   Final   Special Requests NONE   Final   Culture  Setup Time     Final   Value: 01/17/2013 21:18     Performed at [facility] Count     Final   Value: >=100,000 COLONIES/ML     Performed at [facility]   Culture     Final   Value: KLEBSIELLA PNEUMONIAE     Performed at [facility]   Report Status 01/19/2013 FINAL   Final   Organism ID, Bacteria KLEBSIELLA PNEUMONIAE   Final    [x]  Treated with Macrobid, organism resistant to prescribed antimicrobial []  Patient discharged originally without antimicrobial agent and treatment is now indicated  New antibiotic prescription: Bactrim DS 1 tablet PO BID x 5 days  ED Provider: Johnnette Gourd, PA-C   Cleon Dew 01/20/2013, 2:04 PM Infectious Diseases Pharmacist Phone# [phone]

## 2013-01-21 ENCOUNTER — Telehealth (HOSPITAL_COMMUNITY): Payer: Self-pay | Admitting: *Deleted

## 2013-01-23 ENCOUNTER — Telehealth (HOSPITAL_COMMUNITY): Payer: Self-pay | Admitting: Emergency Medicine

## 2013-01-24 ENCOUNTER — Telehealth (HOSPITAL_COMMUNITY): Payer: Self-pay | Admitting: Emergency Medicine

## 2013-01-24 NOTE — ED Notes (Signed)
Unable to contact patient via phone. Sent letter. °

## 2013-01-25 ENCOUNTER — Telehealth (HOSPITAL_COMMUNITY): Payer: Self-pay | Admitting: Emergency Medicine

## 2013-01-25 NOTE — Telephone Encounter (Signed)
ID verfied. Patient notified of + urine culture and need for new antibiotic. instructed to stop Macrobid. RX Bactrim DS one PO BID x seven days called to [facility] in [place].

## 2013-03-28 ENCOUNTER — Emergency Department (HOSPITAL_COMMUNITY)
Admission: EM | Admit: 2013-03-28 | Discharge: 2013-03-28 | Disposition: A | Discharge status: Home / Self Care | Payer: Self-pay | Attending: Emergency Medicine | Admitting: Emergency Medicine

## 2013-03-28 NOTE — ED Notes (Signed)
Unable to locate pt for triage. Reg staff report pt went outside and has not returned.

## 2013-03-28 NOTE — ED Notes (Signed)
Unable to locate pt in all waiting areas 

## 2013-04-16 ENCOUNTER — Encounter (HOSPITAL_COMMUNITY): Payer: Self-pay | Admitting: Emergency Medicine

## 2013-04-16 ENCOUNTER — Emergency Department (HOSPITAL_COMMUNITY)
Admission: EM | Admit: 2013-04-16 | Discharge: 2013-04-16 | Disposition: A | Discharge status: Home / Self Care | Payer: Medicaid Other | Attending: Emergency Medicine | Admitting: Emergency Medicine

## 2013-04-16 DIAGNOSIS — Z79899 Other long term (current) drug therapy: Secondary | ICD-10-CM | POA: Insufficient documentation

## 2013-04-16 DIAGNOSIS — H6690 Otitis media, unspecified, unspecified ear: Secondary | ICD-10-CM | POA: Insufficient documentation

## 2013-04-16 DIAGNOSIS — F17200 Nicotine dependence, unspecified, uncomplicated: Secondary | ICD-10-CM | POA: Insufficient documentation

## 2013-04-16 DIAGNOSIS — IMO0002 Reserved for concepts with insufficient information to code with codable children: Secondary | ICD-10-CM | POA: Insufficient documentation

## 2013-04-16 DIAGNOSIS — F319 Bipolar disorder, unspecified: Secondary | ICD-10-CM | POA: Insufficient documentation

## 2013-04-16 DIAGNOSIS — J3489 Other specified disorders of nose and nasal sinuses: Secondary | ICD-10-CM | POA: Insufficient documentation

## 2013-04-16 DIAGNOSIS — Z880 Allergy status to penicillin: Secondary | ICD-10-CM | POA: Insufficient documentation

## 2013-04-16 DIAGNOSIS — J45909 Unspecified asthma, uncomplicated: Secondary | ICD-10-CM | POA: Insufficient documentation

## 2013-04-16 DIAGNOSIS — Z792 Long term (current) use of antibiotics: Secondary | ICD-10-CM | POA: Insufficient documentation

## 2013-04-16 MED ORDER — AZITHROMYCIN 250 MG PO TABS
500.0000 mg | ORAL_TABLET | Freq: Once | ORAL | Status: AC
  Administered 2013-04-16: 500 mg via ORAL
  Filled 2013-04-16: qty 2

## 2013-04-16 MED ORDER — AZITHROMYCIN 250 MG PO TABS: ORAL_TABLET | ORAL | Status: DC

## 2013-04-16 MED ORDER — ANTIPYRINE-BENZOCAINE 5.4-1.4 % OT SOLN
3.0000 [drp] | Freq: Once | OTIC | Status: AC
Start: 2013-04-16 — End: 2013-04-16
  Administered 2013-04-16: 4 [drp] via OTIC
  Filled 2013-04-16: qty 10

## 2013-04-16 NOTE — ED Notes (Signed)
Pt c/o r earache since yesterday and left ear started hurting this morning.

## 2013-04-16 NOTE — ED Notes (Signed)
Patient with no complaints at this time. Respirations even and unlabored. Skin warm/dry. Discharge instructions reviewed with patient at this time. Patient given opportunity to voice concerns/ask questions. Patient discharged at this time and left Emergency Department with steady gait.   

## 2013-04-16 NOTE — ED Provider Notes (Signed)
Medical screening examination/treatment/procedure(s) were performed by non-physician practitioner and as supervising physician I was immediately available for consultation/collaboration.  EKG Interpretation   None         Glynn Octave, MD 04/16/13 1534

## 2013-04-16 NOTE — ED Provider Notes (Signed)
CSN: [id number]     Arrival date & time 04/16/13  0908 History   First MD Initiated Contact with Patient 04/16/13 [phone]     Chief Complaint  Patient presents with  . Otalgia   (Consider location/radiation/quality/duration/timing/severity/associated sxs/prior Treatment) Patient is a 22 y.o. female presenting with ear pain. The history is provided by the patient.  Otalgia Location:  Bilateral Behind ear:  No abnormality Quality:  Aching and throbbing Severity:  Moderate Onset quality:  Gradual Duration: right ear pain for 24 hrs, left ear began hurting this morning. Timing:  Constant Progression:  Worsening Chronicity:  New Context: not direct blow, not foreign body in ear, not loud noise and no water in ear   Context comment:  Sinus pressure and nasal congestion Relieved by:  Nothing Worsened by:  Cold air Associated symptoms: congestion, ear discharge, hearing loss and rhinorrhea   Associated symptoms: no abdominal pain, no cough, no diarrhea, no fever, no headaches, no neck pain, no rash, no sore throat, no tinnitus and no vomiting   Congestion:    Location:  Nasal   Interferes with sleep: no     Interferes with eating/drinking: no   Risk factors: prior ear surgery     Past Medical History  Diagnosis Date  . Asthma   . Bipolar 1 disorder    Past Surgical History  Procedure Laterality Date  . Cholecystectomy    . Myringotomy     No family history on file. History  Substance Use Topics  . Smoking status: Current Every Day Smoker -- 1.00 packs/day for 8 years    Types: Cigarettes  . Smokeless tobacco: Not on file  . Alcohol Use: No   OB History   Grav Para Term Preterm Abortions TAB SAB Ect Mult Living   1         1     Review of Systems  Constitutional: Negative for fever, chills, activity change and appetite change.  HENT: Positive for congestion, ear discharge, ear pain, hearing loss, rhinorrhea and sinus pressure. Negative for facial swelling, sore throat,  tinnitus and trouble swallowing.   Eyes: Negative for visual disturbance.  Respiratory: Negative for cough, shortness of breath, wheezing and stridor.   Gastrointestinal: Negative for nausea, vomiting, abdominal pain and diarrhea.  Musculoskeletal: Negative for neck pain and neck stiffness.  Skin: Negative.  Negative for rash.  Neurological: Negative for dizziness, syncope, facial asymmetry, speech difficulty, weakness, numbness and headaches.  Hematological: Negative for adenopathy.  Psychiatric/Behavioral: Negative for confusion.  All other systems reviewed and are negative.    Allergies  Yellow jacket venom; Amoxicillin; Ketorolac tromethamine; Penicillins; Tramadol; and Vistaril  Home Medications   Current Outpatient Rx  Name  Route  Sig  Dispense  Refill  . acetaminophen (TYLENOL) 500 MG tablet   Oral   Take 1,000 mg by mouth every 6 (six) hours as needed. For pain         . albuterol (VENTOLIN HFA) 108 (90 BASE) MCG/ACT inhaler   Inhalation   Inhale 2 puffs into the lungs every 4 (four) hours as needed. For shortness of breath         . azithromycin (ZITHROMAX) 250 MG tablet      Take two tablets on day one, then one tab qd days 2-5         . flintstones complete (FLINTSTONES) 60 MG chewable tablet   Oral   Chew 1 tablet by mouth daily.         Marland Kitchen  Fluticasone-Salmeterol (ADVAIR) 100-50 MCG/DOSE AEPB   Inhalation   Inhale 1 puff into the lungs every 12 (twelve) hours.           . montelukast (SINGULAIR) 10 MG tablet   Oral   Take 10 mg by mouth at bedtime.         Marland Kitchen neomycin-polymyxin-hydrocortisone (CORTISPORIN) 3.5-10000-1 otic suspension   Left Ear   Place 4 drops into the left ear 3 (three) times daily.   10 mL   0   . nitrofurantoin, macrocrystal-monohydrate, (MACROBID) 100 MG capsule   Oral   Take 1 capsule (100 mg total) by mouth 2 (two) times daily.   10 capsule   0   . sertraline (ZOLOFT) 25 MG tablet   Oral   Take 25 mg by mouth 2  (two) times daily.         Marland Kitchen zolpidem (AMBIEN) 5 MG tablet   Oral   Take 5 mg by mouth at bedtime.           BP 122/56  Pulse 94  Temp(Src) 97.7 F (36.5 C) (Oral)  Resp 20  Ht 5\' 1"  (1.549 m)  Wt 140 lb (63.504 kg)  BMI 26.47 kg/m2  SpO2 100%  LMP 04/15/2013 Physical Exam  Nursing note and vitals reviewed. Constitutional: She is oriented to person, place, and time. She appears well-developed and well-nourished. No distress.  HENT:  Head: Normocephalic and atraumatic.  Mouth/Throat: Uvula is midline, oropharynx is clear and moist and mucous membranes are normal. No uvula swelling. No oropharyngeal exudate.  erythema of the right TM.  Scarring present, no perforation.  Left TM has scarring and open TM which appears to be result of tube placement.  Mild middle ear effusion present.  No mastoid tenderness.  No drainage or edema of the canals.  Neck: Normal range of motion. Neck supple.  Cardiovascular: Normal rate, regular rhythm, normal heart sounds and intact distal pulses.   No murmur heard. Pulmonary/Chest: Effort normal and breath sounds normal. No stridor. No respiratory distress. She has no wheezes. She has no rales.  Musculoskeletal: Normal range of motion.  Lymphadenopathy:    She has no cervical adenopathy.  Neurological: She is alert and oriented to person, place, and time. Coordination normal.  Skin: Skin is warm and dry. No rash noted.    ED Course  Procedures (including critical care time) Labs Review Labs Reviewed - No data to display Imaging Review No results found.  EKG Interpretation   None       MDM    Auralgan otic drops applied to the right ear.  Pain improved.  Patient has hx of bilateral tympanostomy tubes placed in April 2014, both tubes are no longer present.   VSS.  No fever or neck pain.  No concerning sx's for mastoiditis.  Plan includes zithromax and auralgan otic drops.  Pt agrees to ibuprofen for pain and close ENT follow-up.  She  appears stable for discharge.    Truda Staub L. Trisha Mangle, PA-C 04/16/13 [id number]

## 2013-04-21 ENCOUNTER — Emergency Department (HOSPITAL_COMMUNITY)
Admission: EM | Admit: 2013-04-21 | Discharge: 2013-04-22 | Disposition: A | Discharge status: Home / Self Care | Payer: Medicaid Other | Attending: Emergency Medicine | Admitting: Emergency Medicine

## 2013-04-21 ENCOUNTER — Encounter (HOSPITAL_COMMUNITY): Payer: Self-pay | Admitting: Emergency Medicine

## 2013-04-21 DIAGNOSIS — N939 Abnormal uterine and vaginal bleeding, unspecified: Secondary | ICD-10-CM | POA: Insufficient documentation

## 2013-04-21 DIAGNOSIS — Z3490 Encounter for supervision of normal pregnancy, unspecified, unspecified trimester: Secondary | ICD-10-CM

## 2013-04-21 DIAGNOSIS — Z880 Allergy status to penicillin: Secondary | ICD-10-CM | POA: Insufficient documentation

## 2013-04-21 DIAGNOSIS — N898 Other specified noninflammatory disorders of vagina: Secondary | ICD-10-CM | POA: Insufficient documentation

## 2013-04-21 DIAGNOSIS — R1031 Right lower quadrant pain: Secondary | ICD-10-CM

## 2013-04-21 DIAGNOSIS — N926 Irregular menstruation, unspecified: Secondary | ICD-10-CM | POA: Insufficient documentation

## 2013-04-21 DIAGNOSIS — F17200 Nicotine dependence, unspecified, uncomplicated: Secondary | ICD-10-CM | POA: Insufficient documentation

## 2013-04-21 DIAGNOSIS — Z3201 Encounter for pregnancy test, result positive: Secondary | ICD-10-CM | POA: Insufficient documentation

## 2013-04-21 DIAGNOSIS — R197 Diarrhea, unspecified: Secondary | ICD-10-CM | POA: Insufficient documentation

## 2013-04-21 DIAGNOSIS — F319 Bipolar disorder, unspecified: Secondary | ICD-10-CM | POA: Insufficient documentation

## 2013-04-21 DIAGNOSIS — IMO0002 Reserved for concepts with insufficient information to code with codable children: Secondary | ICD-10-CM | POA: Insufficient documentation

## 2013-04-21 DIAGNOSIS — Z79899 Other long term (current) drug therapy: Secondary | ICD-10-CM | POA: Insufficient documentation

## 2013-04-21 DIAGNOSIS — R112 Nausea with vomiting, unspecified: Secondary | ICD-10-CM

## 2013-04-21 DIAGNOSIS — J45909 Unspecified asthma, uncomplicated: Secondary | ICD-10-CM | POA: Insufficient documentation

## 2013-04-21 DIAGNOSIS — R5381 Other malaise: Secondary | ICD-10-CM | POA: Insufficient documentation

## 2013-04-21 MED ORDER — ONDANSETRON HCL 4 MG/2ML IJ SOLN
4.0000 mg | Freq: Once | INTRAMUSCULAR | Status: AC
  Administered 2013-04-22: 4 mg via INTRAVENOUS
  Filled 2013-04-21: qty 2

## 2013-04-21 MED ORDER — SODIUM CHLORIDE 0.9 % IV BOLUS (SEPSIS)
1000.0000 mL | INTRAVENOUS | Status: AC
  Administered 2013-04-22: 1000 mL via INTRAVENOUS

## 2013-04-21 MED ORDER — MORPHINE SULFATE 4 MG/ML IJ SOLN
6.0000 mg | Freq: Once | INTRAMUSCULAR | Status: AC
  Administered 2013-04-22: 6 mg via INTRAVENOUS
  Filled 2013-04-21: qty 2

## 2013-04-21 NOTE — ED Provider Notes (Signed)
CSN: [id number]     Arrival date & time 04/21/13  2305 History   First MD Initiated Contact with Patient 04/21/13 2324     Chief Complaint  Patient presents with  . Abdominal Pain  . Emesis   (Consider location/radiation/quality/duration/timing/severity/associated sxs/prior Treatment) HPI Pt is a 22yo female c/o 2 day hx of abdominal pain associated with "flu like symptoms." Pt c/o constant RLQ pain that is sharp and shooting in nature, 9/10 at worst, associated with 2 days of fatigue, nausea, vomiting and loose stools.  Pt also reports a mild dry cough.  Pt is concerned she may be pregnant as she is sexually active, does not use birth control and her last menstrual cycle on Friday only lasted 2 days.  States her cycles normally last 7-9 days.  Denies fevers. Denies sick contacts or recent travel. Pt has had a cholecystectomy but still has her appendix.    Past Medical History  Diagnosis Date  . Asthma   . Bipolar 1 disorder    Past Surgical History  Procedure Laterality Date  . Cholecystectomy    . Myringotomy     No family history on file. History  Substance Use Topics  . Smoking status: Current Every Day Smoker -- 1.00 packs/day for 8 years    Types: Cigarettes  . Smokeless tobacco: Not on file  . Alcohol Use: No   OB History   Grav Para Term Preterm Abortions TAB SAB Ect Mult Living   1         1     Review of Systems  Constitutional: Positive for fatigue. Negative for fever and chills.  Gastrointestinal: Positive for nausea, vomiting, abdominal pain ( RLQ) and diarrhea.  Genitourinary: Positive for flank pain ( right) and menstrual problem ( shorter duration). Negative for dysuria, urgency, hematuria, vaginal bleeding, vaginal discharge, vaginal pain and pelvic pain.  All other systems reviewed and are negative.    Allergies  Yellow jacket venom; Amoxicillin; Ketorolac tromethamine; Penicillins; Tramadol; and Vistaril  Home Medications   Current Outpatient Rx   Name  Route  Sig  Dispense  Refill  . acetaminophen (TYLENOL) 500 MG tablet   Oral   Take 1,000 mg by mouth every 6 (six) hours as needed. For pain         . albuterol (VENTOLIN HFA) 108 (90 BASE) MCG/ACT inhaler   Inhalation   Inhale 2 puffs into the lungs every 4 (four) hours as needed. For shortness of breath         . azithromycin (ZITHROMAX Z-PAK) 250 MG tablet      Take two tablets on day one, then one tab qd days 2-5   6 tablet   0   . flintstones complete (FLINTSTONES) 60 MG chewable tablet   Oral   Chew 1 tablet by mouth daily.         . Fluticasone-Salmeterol (ADVAIR) 100-50 MCG/DOSE AEPB   Inhalation   Inhale 1 puff into the lungs every 12 (twelve) hours.           . montelukast (SINGULAIR) 10 MG tablet   Oral   Take 10 mg by mouth at bedtime.         . sertraline (ZOLOFT) 25 MG tablet   Oral   Take 25 mg by mouth 2 (two) times daily.         Marland Kitchen zolpidem (AMBIEN) 5 MG tablet   Oral   Take 5 mg by mouth at bedtime.  BP 106/59  Pulse 72  Temp(Src) 98 F (36.7 C) (Oral)  Resp 16  SpO2 100%  LMP 04/15/2013 Physical Exam  Nursing note and vitals reviewed. Constitutional: She appears well-developed and well-nourished.  Pt lying in exam bed, emesis bag at her side. Appears mildly fatigued.  HENT:  Head: Normocephalic and atraumatic.  Eyes: Conjunctivae are normal. No scleral icterus.  Neck: Normal range of motion.  Cardiovascular: Normal rate, regular rhythm and normal heart sounds.   Pulmonary/Chest: Effort normal and breath sounds normal. No respiratory distress. She has no wheezes. She has no rales. She exhibits no tenderness.  Abdominal: Soft. Bowel sounds are normal. She exhibits no distension and no mass. There is tenderness ( RLQ). There is guarding. There is no rebound.  Genitourinary: Uterus normal. Pelvic exam was performed with patient supine. No labial fusion. There is no rash, tenderness, lesion or injury on the right  labia. There is no rash, tenderness, lesion or injury on the left labia. Cervix exhibits discharge ( scant, white). Cervix exhibits no motion tenderness and no friability. Right adnexum displays tenderness. Right adnexum displays no mass and no fullness. Left adnexum displays no mass, no tenderness and no fullness. No erythema, tenderness or bleeding around the vagina. No foreign body around the vagina. No signs of injury around the vagina. Vaginal discharge ( scant, white) found.  External exam: no tenderness, lesions or rashes   Musculoskeletal: Normal range of motion.  Neurological: She is alert.  Skin: Skin is warm and dry.    ED Course  Procedures (including critical care time) Labs Review Labs Reviewed  WET PREP, GENITAL - Abnormal; Notable for the following:    Clue Cells Wet Prep HPF POC FEW (*)    WBC, Wet Prep HPF POC FEW (*)    All other components within normal limits  URINALYSIS, ROUTINE W REFLEX MICROSCOPIC - Abnormal; Notable for the following:    pH 8.5 (*)    All other components within normal limits  PREGNANCY, URINE - Abnormal; Notable for the following:    Preg Test, Ur POSITIVE (*)    All other components within normal limits  CBC - Abnormal; Notable for the following:    HCT 34.6 (*)    Platelets 137 (*)    All other components within normal limits  BASIC METABOLIC PANEL - Abnormal; Notable for the following:    Potassium 3.4 (*)    All other components within normal limits  HCG, QUANTITATIVE, PREGNANCY - Abnormal; Notable for the following:    hCG, Beta Chain, Quant, S 8045 (*)    All other components within normal limits  GC/CHLAMYDIA PROBE AMP   Imaging Review No results found.  EKG Interpretation   None       MDM   1. Pregnancy at early stage   2. RLQ abdominal pain   3. Nausea & vomiting    Pt is a 22yo healthy female c/o RLQ pain, nausea and vomiting, and fatigue.  Pt concerned for pregnancy.  On exam, pt appears uncomfortable.  Vitals: WNL,  hemodynamically stable. Abd: soft, tenderness in RLQ with guarding.  Concern for appendicitis vs ectopic vs ovarian cyst.    UA: unremarkable. Urine Pregnancy: POSITIVE HCG quant: 8045, ~3-5wks  CBC and BMP: unremarkable Wet prep: unremarkable.  Discussed pt with Dr. Judd Lien who performed bedside U/S on pt and was able to appreciate an intrauterine pregnancy.  As pt is hemodynamically stable at this time and bedside U/S, pt may be discharged home  to f/u tomorrow for Pregnancy U/S <14wks.  Advised to take tylenol as needed for pain.  Strict return precautions provided. Pt verbalized understanding and agreement with tx plan.     Junius Finner, PA-C 04/22/13 [phone]

## 2013-04-21 NOTE — ED Notes (Signed)
Pt states she had a short period this time and states has not been feeling well, is very tired, nausea, vomiting, right lower abd pain for several days.

## 2013-04-22 ENCOUNTER — Ambulatory Visit (HOSPITAL_COMMUNITY): Payer: Medicaid Other

## 2013-04-22 LAB — CBC
HCT: 34.6 % — ABNORMAL LOW (ref 36.0–46.0)
Hemoglobin: 12 g/dL (ref 12.0–15.0)
MCH: 30.9 pg (ref 26.0–34.0)
MCHC: 34.7 g/dL (ref 30.0–36.0)
MCV: 89.2 fL (ref 78.0–100.0)
Platelets: 137 10*3/uL — ABNORMAL LOW (ref 150–400)
RBC: 3.88 MIL/uL (ref 3.87–5.11)
RDW: 12.9 % (ref 11.5–15.5)
WBC: 5 10*3/uL (ref 4.0–10.5)

## 2013-04-22 LAB — WET PREP, GENITAL
Trich, Wet Prep: NONE SEEN
Yeast Wet Prep HPF POC: NONE SEEN

## 2013-04-22 LAB — URINALYSIS, ROUTINE W REFLEX MICROSCOPIC
Bilirubin Urine: NEGATIVE
Glucose, UA: NEGATIVE mg/dL
Hgb urine dipstick: NEGATIVE
Ketones, ur: NEGATIVE mg/dL
pH: 8.5 — ABNORMAL HIGH (ref 5.0–8.0)

## 2013-04-22 LAB — BASIC METABOLIC PANEL
BUN: 6 mg/dL (ref 6–23)
CO2: 25 mEq/L (ref 19–32)
Calcium: 8.7 mg/dL (ref 8.4–10.5)
Chloride: 101 mEq/L (ref 96–112)
Creatinine, Ser: 0.55 mg/dL (ref 0.50–1.10)
GFR calc Af Amer: >90 mL/min (ref >90)
GFR calc non Af Amer: >90 mL/min (ref >90)
Glucose, Bld: 91 mg/dL (ref 70–99)
Potassium: 3.4 mEq/L — ABNORMAL LOW (ref 3.5–5.1)
Sodium: 137 mEq/L (ref 135–145)

## 2013-04-22 LAB — HCG, QUANTITATIVE, PREGNANCY: hCG, Beta Chain, Quant, S: 8045 m[IU]/mL — ABNORMAL HIGH (ref <5)

## 2013-04-22 NOTE — ED Provider Notes (Signed)
Medical screening examination/treatment/procedure(s) were conducted as a shared visit with non-physician practitioner(s) and myself.  I personally evaluated the patient during the encounter. Patient is a 22 year old female presents to the emergency department with a two-day history of lower abdominal pain. She also feels fatigued, nauseated, and has had several loose stools. She is sexually active and denies using birth control. Her last menstrual period only lasted 2 days and was one month ago. She denies any vaginal discharge or bleeding.  On exam vitals are stable and the patient is afebrile. Heart is regular rate and rhythm and the lungs are clear. Abdomen is significant for tenderness to palpation in the suprapubic region and right lower quadrant without rebound or guarding. Pelvic examination was unremarkable as performed by Junius Finner.  Pregnancy test is positive and beta hCG is 8000. At bedside ultrasound performed in the emergency department by myself and revealed a gestational sac. I was unable to identify any definite fetal heart tones, however I am confident that this is not ectopic pregnancy as there is no bleeding.Marland Kitchen She will be brought back in the morning to have a formal ultrasound performed. She has no white count and her exam and history is not consistent with appendicitis and I have a low suspicion of this. She is to return if she develops vaginal bleeding, high fever, or other new or concerning symptoms.      Geoffery Lyons, MD 04/22/13 [phone]

## 2013-04-23 ENCOUNTER — Ambulatory Visit (HOSPITAL_COMMUNITY): Admission: RE | Admit: 2013-04-23 | Payer: Medicaid Other | Source: Ambulatory Visit

## 2013-04-23 LAB — GC/CHLAMYDIA PROBE AMP
CT Probe RNA: NEGATIVE
GC Probe RNA: NEGATIVE

## 2013-04-26 ENCOUNTER — Ambulatory Visit (HOSPITAL_COMMUNITY)
Admission: RE | Admit: 2013-04-26 | Discharge: 2013-04-26 | Disposition: A | Discharge status: Home / Self Care | Payer: Medicaid Other | Source: Ambulatory Visit | Attending: Emergency Medicine | Admitting: Emergency Medicine

## 2013-04-26 ENCOUNTER — Other Ambulatory Visit (HOSPITAL_COMMUNITY): Payer: Self-pay | Admitting: Emergency Medicine

## 2013-04-26 DIAGNOSIS — R52 Pain, unspecified: Secondary | ICD-10-CM

## 2013-04-26 DIAGNOSIS — R109 Unspecified abdominal pain: Secondary | ICD-10-CM | POA: Insufficient documentation

## 2013-04-26 DIAGNOSIS — O99891 Other specified diseases and conditions complicating pregnancy: Secondary | ICD-10-CM | POA: Insufficient documentation

## 2013-04-26 NOTE — ED Provider Notes (Signed)
Ultrasound discussed with patient and her husband. Ultrasound reports an intrauterine pregnancy at 6-1/7 weeks.  She will go to a local obstetrician.  Donnetta Hutching, MD 04/26/13 [phone]

## 2013-06-03 ENCOUNTER — Emergency Department (HOSPITAL_COMMUNITY)
Admission: EM | Admit: 2013-06-03 | Discharge: 2013-06-03 | Disposition: A | Discharge status: Home / Self Care | Payer: Medicaid Other | Attending: Emergency Medicine | Admitting: Emergency Medicine

## 2013-06-03 ENCOUNTER — Encounter (HOSPITAL_COMMUNITY): Payer: Self-pay | Admitting: Emergency Medicine

## 2013-06-03 DIAGNOSIS — H7290 Unspecified perforation of tympanic membrane, unspecified ear: Secondary | ICD-10-CM | POA: Insufficient documentation

## 2013-06-03 DIAGNOSIS — Y929 Unspecified place or not applicable: Secondary | ICD-10-CM | POA: Insufficient documentation

## 2013-06-03 DIAGNOSIS — S199XXA Unspecified injury of neck, initial encounter: Secondary | ICD-10-CM

## 2013-06-03 DIAGNOSIS — S0990XA Unspecified injury of head, initial encounter: Secondary | ICD-10-CM | POA: Insufficient documentation

## 2013-06-03 DIAGNOSIS — Y939 Activity, unspecified: Secondary | ICD-10-CM | POA: Insufficient documentation

## 2013-06-03 DIAGNOSIS — S0993XA Unspecified injury of face, initial encounter: Secondary | ICD-10-CM | POA: Insufficient documentation

## 2013-06-03 DIAGNOSIS — S0083XA Contusion of other part of head, initial encounter: Secondary | ICD-10-CM

## 2013-06-03 DIAGNOSIS — S0003XA Contusion of scalp, initial encounter: Secondary | ICD-10-CM | POA: Insufficient documentation

## 2013-06-03 DIAGNOSIS — O99330 Smoking (tobacco) complicating pregnancy, unspecified trimester: Secondary | ICD-10-CM | POA: Insufficient documentation

## 2013-06-03 DIAGNOSIS — Z880 Allergy status to penicillin: Secondary | ICD-10-CM | POA: Insufficient documentation

## 2013-06-03 DIAGNOSIS — F319 Bipolar disorder, unspecified: Secondary | ICD-10-CM | POA: Insufficient documentation

## 2013-06-03 DIAGNOSIS — Z792 Long term (current) use of antibiotics: Secondary | ICD-10-CM | POA: Insufficient documentation

## 2013-06-03 DIAGNOSIS — Z79899 Other long term (current) drug therapy: Secondary | ICD-10-CM | POA: Insufficient documentation

## 2013-06-03 DIAGNOSIS — O99340 Other mental disorders complicating pregnancy, unspecified trimester: Secondary | ICD-10-CM | POA: Insufficient documentation

## 2013-06-03 DIAGNOSIS — O9989 Other specified diseases and conditions complicating pregnancy, childbirth and the puerperium: Secondary | ICD-10-CM | POA: Insufficient documentation

## 2013-06-03 DIAGNOSIS — IMO0002 Reserved for concepts with insufficient information to code with codable children: Secondary | ICD-10-CM | POA: Insufficient documentation

## 2013-06-03 DIAGNOSIS — S1093XA Contusion of unspecified part of neck, initial encounter: Secondary | ICD-10-CM

## 2013-06-03 DIAGNOSIS — J45909 Unspecified asthma, uncomplicated: Secondary | ICD-10-CM | POA: Insufficient documentation

## 2013-06-03 MED ORDER — AZITHROMYCIN 250 MG PO TABS: ORAL_TABLET | ORAL | Status: DC

## 2013-06-03 MED ORDER — NEOMYCIN-POLYMYXIN-HC 1 % OT SOLN
3.0000 [drp] | Freq: Once | OTIC | Status: AC
  Administered 2013-06-03: 3 [drp] via OTIC
  Filled 2013-06-03: qty 10

## 2013-06-03 MED ORDER — HYDROCODONE-ACETAMINOPHEN 5-325 MG PO TABS
1.0000 | ORAL_TABLET | Freq: Once | ORAL | Status: AC
  Administered 2013-06-03: 1 via ORAL
  Filled 2013-06-03: qty 1

## 2013-06-03 MED ORDER — HYDROCODONE-ACETAMINOPHEN 5-325 MG PO TABS: 1.0000 | ORAL_TABLET | ORAL | Status: DC | PRN

## 2013-06-03 MED ORDER — NEOMYCIN-POLYMYXIN-HC 3.5-10000-1 OT SUSP
3.0000 [drp] | Freq: Three times a day (TID) | OTIC | Status: DC
Start: 2013-06-03 — End: 2013-09-11

## 2013-06-03 NOTE — Discharge Instructions (Signed)
Draining Ear Ear wax, pus, blood and other fluids are examples of the different types of drainage from ears. Drops or cream may be needed to lessen the itching which may occur with ear drainage. CAUSES   Skin irritations in the ear.  Ear infection.  Swimmer's ear.  Ruptured eardrum.  Foreign object in the ear canal.  Sudden pressure changes.  Head injury. HOME CARE INSTRUCTIONS   Only take over-the-counter or prescription medicines for pain, fever, or discomfort as directed by your caregiver.  Do not rub the ear canal with cotton-tipped swabs.  Do not swim until your caregiver says it is okay.  Before you take a shower, cover a cotton ball with petroleum jelly to keep water out.  Limit exposure to smoke. Secondhand smoke can increase the chance for ear infections.  Keep up with immunizations.  Wash your hands well.  Keep all follow-up appointments to examine the ear and evaluate hearing. SEEK MEDICAL CARE IF:   You have increased drainage.  You have ear pain, a fever, or drainage that is not getting better after 48 hours of antibiotics.  You are unusually tired. SEEK IMMEDIATE MEDICAL CARE IF:  You have severe ear pain or headache.  The patient is older than 3 months with a rectal or oral temperature of 102 F (38.9 C) or higher.  The patient is 423 months old or younger with a rectal temperature of 100.4 F (38 C) or higher.  You vomit.  You feel dizzy.  You have a seizure.  You have new hearing loss. MAKE SURE YOU:   Understand these instructions.  Will watch your condition.  Will get help right away if you are not doing well or get worse. Document Released: 04/29/2005 Document Revised: 07/22/2011 Document Reviewed: 03/02/2009 [facility] Patient Information 2014 [employer], [place].  Eardrum Perforation The eardrum is a thin, round tissue inside the ear. It allows you to hear. The eardrum can get torn (perforated). Eardrums often heal on their own. There  is often little or no long-term hearing loss. HOME CARE   Keep your ear dry while it heals. Do not swim, dive, or take showers until your doctor says it is okay.  Before you take a bath, put petroleum jelly all over a cotton ball. Put the cotton ball in your ear. This will keep water out.  Only take medicines as told by your doctor.  Blow your nose gently.  Continue normal activities after your eardrum heals. Your doctor will tell you when your eardrum has healed.  Talk to your doctor before flying on an airplane.  Keep all doctor visits as told. This is important. GET HELP RIGHT AWAY IF:   You have blood or yellowish-white fluid (pus) coming from your ear.  You feel off balance.  You feel dizzy, sick to your stomach (nauseous), or you throw up (vomit).  You have more pain.  You have a fever. MAKE SURE YOU:   Understand these instructions.  Will watch your condition.  Will get help right away if you are not doing well or get worse. Document Released: 10/17/2009 Document Revised: 07/22/2011 Document Reviewed: 10/17/2009 [facility] LPExit[facility] Patient Information 2014 [employer] [place], [place].

## 2013-06-03 NOTE — ED Provider Notes (Signed)
CSN: [id number]     Arrival date & time 06/03/13  1534 History   First MD Initiated Contact with Patient 06/03/13 1639     Chief Complaint  Patient presents with  . Otalgia   (Consider location/radiation/quality/duration/timing/severity/associated sxs/prior Treatment) Patient is a 23 y.o. female presenting with ear pain. The history is provided by the patient.  Otalgia Location:  Left Quality:  Sharp and shooting Severity:  Moderate Onset quality:  Gradual Duration:  3 days Timing:  Constant Progression:  Worsening Chronicity:  New Associated symptoms: congestion, fever and headaches   Associated symptoms: no abdominal pain, no cough, no rash and no vomiting    Brittany Payne is a 23 y.o. female @ approximately [redacted] weeks gestation who presents to the ED with left ear pain. She states that 3 days ago she hit the left side of her head on a kitchen cabinet. The ear pain started after she hit the cabinet. Today the left ear started draining. She continue to have a headache where she hit it. She denies LOC or any other injuries. She has had congestion for the past 2 weeks. She had fever up 102 three days ago, none today.   Past Medical History  Diagnosis Date  . Asthma   . Bipolar 1 disorder    Past Surgical History  Procedure Laterality Date  . Cholecystectomy    . Myringotomy     Family History  Problem Relation Age of Onset  . Hypertension Mother   . Diabetes Father   . Hypertension Father   . Cancer Father   . Diabetes Other    History  Substance Use Topics  . Smoking status: Current Every Day Smoker -- 0.50 packs/day for 8 years    Types: Cigarettes  . Smokeless tobacco: Never Used  . Alcohol Use: No   OB History   Grav Para Term Preterm Abortions TAB SAB Ect Mult Living   2         1     Review of Systems  Constitutional: Positive for fever.  HENT: Positive for congestion and ear pain.   Eyes: Negative for visual disturbance.  Respiratory: Negative for cough  and wheezing.   Gastrointestinal: Negative for nausea, vomiting and abdominal pain.       [redacted] weeks gestation  Genitourinary: Negative for dysuria, urgency and dyspareunia.  Musculoskeletal: Negative for back pain.  Skin: Negative for rash.  Neurological: Positive for headaches.  Psychiatric/Behavioral: Negative for confusion. The patient is not nervous/anxious.     Allergies  Yellow jacket venom; Amoxicillin; Ketorolac tromethamine; Penicillins; Tramadol; and Vistaril  Home Medications   Current Outpatient Rx  Name  Route  Sig  Dispense  Refill  . acetaminophen (TYLENOL) 500 MG tablet   Oral   Take 1,000 mg by mouth every 6 (six) hours as needed. For pain         . albuterol (VENTOLIN HFA) 108 (90 BASE) MCG/ACT inhaler   Inhalation   Inhale 2 puffs into the lungs every 4 (four) hours as needed. For shortness of breath         . azithromycin (ZITHROMAX Z-PAK) 250 MG tablet      Take two tablets on day one, then one tab qd days 2-5   6 tablet   0   . flintstones complete (FLINTSTONES) 60 MG chewable tablet   Oral   Chew 1 tablet by mouth daily.         . Fluticasone-Salmeterol (ADVAIR) 100-50 MCG/DOSE AEPB  Inhalation   Inhale 1 puff into the lungs every 12 (twelve) hours.           . montelukast (SINGULAIR) 10 MG tablet   Oral   Take 10 mg by mouth at bedtime.         . sertraline (ZOLOFT) 25 MG tablet   Oral   Take 25 mg by mouth 2 (two) times daily.         Marland Kitchen zolpidem (AMBIEN) 5 MG tablet   Oral   Take 5 mg by mouth at bedtime.           BP 125/69  Pulse 100  Temp(Src) 97.9 F (36.6 C) (Oral)  Resp 16  Ht 5\' 1"  (1.549 m)  Wt 147 lb (66.679 kg)  BMI 27.79 kg/m2  SpO2 100%  LMP 04/15/2013 Physical Exam  Nursing note and vitals reviewed. Constitutional: She is oriented to person, place, and time. She appears well-developed and well-nourished. No distress.  HENT:  Head: Normocephalic. Head is with contusion.    Right Ear: Tympanic  membrane normal.  Left Ear: There is drainage and swelling. Tympanic membrane is perforated.  Mouth/Throat: Uvula is midline, oropharynx is clear and moist and mucous membranes are normal.  Tenderness over left ear where she hit her head on a cabinet 3 days ago. No ecchymosis or swelling noted. Tender on palpation. Left TM draining, no blood noted. Ear canal with swelling.   Eyes: Conjunctivae and EOM are normal. Pupils are equal, round, and reactive to light.  Neck: Neck supple.  Cardiovascular: Normal rate.   Pulmonary/Chest: Effort normal.  Abdominal: Soft. There is no tenderness.  FHT's 165   Musculoskeletal: Normal range of motion.  Neurological: She is alert and oriented to person, place, and time. She has normal strength. No cranial nerve deficit or sensory deficit. Gait normal.  Skin: Skin is warm and dry.  Psychiatric: She has a normal mood and affect. Her behavior is normal.    ED Course  Procedures   MDM  22 y.o. female with painful left draining ear. She is [redacted] weeks gestation. She hit her head on a cabinet 3 days ago but no LOC or other problems from that. Head hurts over left ear. Will treat for perforated TM and URI. Will treat for pain.  After hydrocodone patient is feeling better. Cortisporin Otic Susp. With first dose here. Patient to follow up with her PCP in 10 days to be sure the infection has cleared. Patient stable for discharge without any immediate complications.    Medication List    TAKE these medications       azithromycin 250 MG tablet  Commonly known as:  ZITHROMAX Z-PAK  Take 2 tablets PO today and then one tablet daily for infection.     HYDROcodone-acetaminophen 5-325 MG per tablet  Commonly known as:  NORCO/VICODIN  Take 1 tablet by mouth every 4 (four) hours as needed.      ASK your doctor about these medications       buPROPion 150 MG 12 hr tablet  Commonly known as:  WELLBUTRIN SR  Take 150 mg by mouth 2 (two) times daily.     flintstones  complete 60 MG chewable tablet  Chew 1 tablet by mouth daily.     Fluticasone-Salmeterol 100-50 MCG/DOSE Aepb  Commonly known as:  ADVAIR  Inhale 1 puff into the lungs every 12 (twelve) hours.     montelukast 10 MG tablet  Commonly known as:  SINGULAIR  Take  10 mg by mouth at bedtime.     sertraline 25 MG tablet  Commonly known as:  ZOLOFT  Take 25 mg by mouth 2 (two) times daily.     VENTOLIN HFA 108 (90 BASE) MCG/ACT inhaler  Generic drug:  albuterol  Inhale 2 puffs into the lungs every 4 (four) hours as needed. For shortness of breath            [facility] Neese, NP 06/03/13 1726

## 2013-06-03 NOTE — ED Notes (Signed)
Pt reports she hit her ear on a cabinet 2 days ago. Woke up this am with "green pus" draining from her ear. Pt also c/o headache.

## 2013-06-03 NOTE — ED Provider Notes (Signed)
Medical screening examination/treatment/procedure(s) were performed by non-physician practitioner and as supervising physician I was immediately available for consultation/collaboration.  EKG Interpretation   None       Devoria AlbeIva Saretta Dahlem, MD, Armando GangFACEP   Ward GivensIva L Elese Rane, MD 06/03/13 [phone]

## 2013-06-15 ENCOUNTER — Emergency Department (HOSPITAL_COMMUNITY)
Admission: EM | Admit: 2013-06-15 | Discharge: 2013-06-15 | Disposition: A | Discharge status: Home / Self Care | Payer: Medicaid Other

## 2013-06-15 NOTE — ED Notes (Signed)
No answer in waiting areas.

## 2013-06-15 NOTE — ED Notes (Signed)
Unable to locate pt in all waiting areas x 3 

## 2013-09-11 ENCOUNTER — Emergency Department (HOSPITAL_COMMUNITY)
Admission: EM | Admit: 2013-09-11 | Discharge: 2013-09-11 | Disposition: A | Discharge status: Home / Self Care | Payer: Medicaid Other | Attending: Emergency Medicine | Admitting: Emergency Medicine

## 2013-09-11 ENCOUNTER — Encounter (HOSPITAL_COMMUNITY): Payer: Self-pay | Admitting: Emergency Medicine

## 2013-09-11 DIAGNOSIS — W208XXA Other cause of strike by thrown, projected or falling object, initial encounter: Secondary | ICD-10-CM | POA: Insufficient documentation

## 2013-09-11 DIAGNOSIS — O99330 Smoking (tobacco) complicating pregnancy, unspecified trimester: Secondary | ICD-10-CM | POA: Insufficient documentation

## 2013-09-11 DIAGNOSIS — Y9389 Activity, other specified: Secondary | ICD-10-CM | POA: Insufficient documentation

## 2013-09-11 DIAGNOSIS — IMO0002 Reserved for concepts with insufficient information to code with codable children: Secondary | ICD-10-CM | POA: Insufficient documentation

## 2013-09-11 DIAGNOSIS — Z79899 Other long term (current) drug therapy: Secondary | ICD-10-CM | POA: Insufficient documentation

## 2013-09-11 DIAGNOSIS — O9989 Other specified diseases and conditions complicating pregnancy, childbirth and the puerperium: Secondary | ICD-10-CM | POA: Insufficient documentation

## 2013-09-11 DIAGNOSIS — J45909 Unspecified asthma, uncomplicated: Secondary | ICD-10-CM | POA: Insufficient documentation

## 2013-09-11 DIAGNOSIS — Y929 Unspecified place or not applicable: Secondary | ICD-10-CM | POA: Insufficient documentation

## 2013-09-11 DIAGNOSIS — Z880 Allergy status to penicillin: Secondary | ICD-10-CM | POA: Insufficient documentation

## 2013-09-11 DIAGNOSIS — S9030XA Contusion of unspecified foot, initial encounter: Secondary | ICD-10-CM | POA: Insufficient documentation

## 2013-09-11 DIAGNOSIS — F319 Bipolar disorder, unspecified: Secondary | ICD-10-CM | POA: Insufficient documentation

## 2013-09-11 DIAGNOSIS — O99340 Other mental disorders complicating pregnancy, unspecified trimester: Secondary | ICD-10-CM | POA: Insufficient documentation

## 2013-09-11 NOTE — Discharge Instructions (Signed)
Please apply ice to your foot. Please elevate her foot is much as possible. Please use the postoperative shoe until you may safely wear your regular shoe. Please follow up with your primary physician if any changes or problems. Foot Contusion A foot contusion is a deep bruise to the foot. Contusions are the result of an injury that caused bleeding under the skin. The contusion may turn blue, purple, or yellow. Minor injuries will give you a painless contusion, but more severe contusions may stay painful and swollen for a few weeks. CAUSES  A foot contusion comes from a direct blow to that area, such as a heavy object falling on the foot. SYMPTOMS   Swelling of the foot.  Discoloration of the foot.  Tenderness or soreness of the foot. DIAGNOSIS  You will have a physical exam and will be asked about your history. You may need an X-ray of your foot to look for a broken bone (fracture).  TREATMENT  An elastic wrap may be recommended to support your foot. Resting, elevating, and applying cold compresses to your foot are often the best treatments for a foot contusion. Over-the-counter medicines may also be recommended for pain control. HOME CARE INSTRUCTIONS   Put ice on the injured area.  Put ice in a plastic bag.  Place a towel between your skin and the bag.  Leave the ice on for 15-20 minutes, 03-04 times a day.  Only take over-the-counter or prescription medicines for pain, discomfort, or fever as directed by your caregiver.  If told, use an elastic wrap as directed. This can help reduce swelling. You may remove the wrap for sleeping, showering, and bathing. If your toes become numb, cold, or blue, take the wrap off and reapply it more loosely.  Elevate your foot with pillows to reduce swelling.  Try to avoid standing or walking while the foot is painful. Do not resume use until instructed by your caregiver. Then, begin use gradually. If pain develops, decrease use. Gradually increase  activities that do not cause discomfort until you have normal use of your foot.  See your caregiver as directed. It is very important to keep all follow-up appointments in order to avoid any lasting problems with your foot, including long-term (chronic) pain. SEEK IMMEDIATE MEDICAL CARE IF:   You have increased redness, swelling, or pain in your foot.  Your swelling or pain is not relieved with medicines.  You have loss of feeling in your foot or are unable to move your toes.  Your foot turns cold or blue.  You have pain when you move your toes.  Your foot becomes warm to the touch.  Your contusion does not improve in 2 days. MAKE SURE YOU:   Understand these instructions.  Will watch your condition.  Will get help right away if you are not doing well or get worse. Document Released: 02/18/2006 Document Revised: 10/29/2011 Document Reviewed: 04/02/2011 [facility] CenterExitCare Patient Information 2014 [employer]Exit[employer], [place].

## 2013-09-11 NOTE — ED Notes (Addendum)
Rt foot pain after dropping a 2L bottle on it per pt. Bruising noted to foot.  ( appox [redacted]weeks  Pregnant) denies any pregnancy related issues.

## 2013-09-11 NOTE — ED Provider Notes (Signed)
CSN: [id number]     Arrival date & time 09/11/13  1403 History   First MD Initiated Contact with Patient 09/11/13 1446     Chief Complaint  Patient presents with  . Foot Pain     (Consider location/radiation/quality/duration/timing/severity/associated sxs/prior Treatment) Patient is a 23 y.o. female presenting with foot injury. The history is provided by the patient.  Foot Injury Location:  Foot Time since incident:  1 day Injury: yes   Mechanism of injury comment:  Pt dropped a 2 liter bottle on the right foot Foot location:  R foot Pain details:    Quality:  Aching and throbbing   Radiates to:  Does not radiate   Severity:  Moderate   Duration:  1 day   Timing:  Intermittent   Progression:  Worsening Chronicity:  New Dislocation: no   Foreign body present:  No foreign bodies Prior injury to area:  No Relieved by:  Nothing Worsened by:  Bearing weight Associated symptoms: stiffness, swelling and tingling   Associated symptoms: no back pain, no neck pain and no numbness   Risk factors: no known bone disorder and no recent illness   Risk factors comment:  Pt is [redacted] weeks pregnant   Past Medical History  Diagnosis Date  . Asthma   . Bipolar 1 disorder    Past Surgical History  Procedure Laterality Date  . Cholecystectomy    . Myringotomy     Family History  Problem Relation Age of Onset  . Hypertension Mother   . Diabetes Father   . Hypertension Father   . Cancer Father   . Diabetes Other    History  Substance Use Topics  . Smoking status: Current Every Day Smoker -- 0.50 packs/day for 8 years    Types: Cigarettes  . Smokeless tobacco: Never Used  . Alcohol Use: No   OB History   Grav Para Term Preterm Abortions TAB SAB Ect Mult Living   2         1     Review of Systems  Constitutional: Negative for activity change.       All ROS Neg except as noted in HPI  HENT: Negative for nosebleeds.   Eyes: Negative for photophobia and discharge.  Respiratory:  Negative for cough, shortness of breath and wheezing.   Cardiovascular: Negative for chest pain and palpitations.  Gastrointestinal: Negative for abdominal pain and blood in stool.  Genitourinary: Negative for dysuria, frequency and hematuria.  Musculoskeletal: Positive for stiffness. Negative for arthralgias, back pain and neck pain.  Skin: Negative.   Neurological: Negative for dizziness, seizures and speech difficulty.  Psychiatric/Behavioral: Negative for hallucinations and confusion.      Allergies  Yellow jacket venom; Amoxicillin; Ketorolac tromethamine; Penicillins; Tramadol; and Vistaril  Home Medications   Prior to Admission medications   Medication Sig Start Date End Date Taking? Authorizing Provider  albuterol (VENTOLIN HFA) 108 (90 BASE) MCG/ACT inhaler Inhale 2 puffs into the lungs every 4 (four) hours as needed. For shortness of breath   Yes Historical Provider, MD  buPROPion (WELLBUTRIN SR) 150 MG 12 hr tablet Take 150 mg by mouth 2 (two) times daily.   Yes Historical Provider, MD  flintstones complete (FLINTSTONES) 60 MG chewable tablet Chew 1 tablet by mouth daily.   Yes Historical Provider, MD  Fluticasone-Salmeterol (ADVAIR) 100-50 MCG/DOSE AEPB Inhale 1 puff into the lungs every 12 (twelve) hours.     Yes Historical Provider, MD  montelukast (SINGULAIR) 10 MG tablet Take  10 mg by mouth at bedtime.   Yes Historical Provider, MD  sertraline (ZOLOFT) 25 MG tablet Take 25 mg by mouth 2 (two) times daily.   Yes Historical Provider, MD   BP 126/67  Pulse 98  Temp(Src) 97.4 F (36.3 C) (Oral)  Resp 16  SpO2 100%  LMP 04/15/2013 Physical Exam  Nursing note and vitals reviewed. Constitutional: She is oriented to person, place, and time. She appears well-developed and well-nourished.  Non-toxic appearance.  HENT:  Head: Normocephalic.  Right Ear: Tympanic membrane and external ear normal.  Left Ear: Tympanic membrane and external ear normal.  Eyes: EOM and lids are  normal. Pupils are equal, round, and reactive to light.  Neck: Normal range of motion. Neck supple. Carotid bruit is not present.  Cardiovascular: Normal rate, regular rhythm, normal heart sounds, intact distal pulses and normal pulses.   Pulmonary/Chest: Breath sounds normal. No respiratory distress.  Abdominal: Soft. Bowel sounds are normal. There is no tenderness. There is no guarding.  Musculoskeletal: Normal range of motion.       Right foot: She exhibits tenderness. She exhibits normal capillary refill.       Feet:  Lymphadenopathy:       Head (right side): No submandibular adenopathy present.       Head (left side): No submandibular adenopathy present.    She has no cervical adenopathy.  Neurological: She is alert and oriented to person, place, and time. She has normal strength. No cranial nerve deficit or sensory deficit.  Skin: Skin is warm and dry.  Psychiatric: She has a normal mood and affect. Her speech is normal.    ED Course  Procedures (including critical care time) Labs Review Labs Reviewed - No data to display  Imaging Review No results found.   EKG Interpretation None      MDM Vital signs are well within normal limits. Pulse oximetry is 100% on room air. Within normal limits by my interpretation. Patient has some bruising of the dorsum of the foot. No deformity of the toes or foot. No vascular or neurologic compromise is appreciated.  The patient was not x-rayed due to to pregnancy.suspect the patient has a bruise to the dorsum of the foot. Patient was given an ice pack, and fitted with a postoperative shoe. Patient advised to see her primary physician, or return to the emergency department if not improving.    Final diagnoses:  None    *I have reviewed nursing notes, vital signs, and all appropriate lab and imaging results for this patient.Kathie Dike*    Makailah Slavick M Annia Gomm, PA-C 09/12/13 2134

## 2013-09-11 NOTE — ED Notes (Signed)
Pt received discharge instructions and prescriptions, verbalized understanding and has no further questions. Pt ambulated to exit in stable condition.  Advised to return to emergency department with new or worsening symptoms.  

## 2013-09-13 NOTE — ED Provider Notes (Signed)
Medical screening examination/treatment/procedure(s) were performed by non-physician practitioner and as supervising physician I was immediately available for consultation/collaboration.   EKG Interpretation None        Gilda Creasehristopher J. Carri Spillers, MD 09/13/13 [phone]

## 2013-10-22 ENCOUNTER — Encounter (HOSPITAL_COMMUNITY): Payer: Self-pay | Admitting: Emergency Medicine

## 2013-10-22 ENCOUNTER — Emergency Department (HOSPITAL_COMMUNITY)
Admission: EM | Admit: 2013-10-22 | Discharge: 2013-10-23 | Disposition: A | Discharge status: Home / Self Care | Payer: Medicaid Other | Attending: Emergency Medicine | Admitting: Emergency Medicine

## 2013-10-22 DIAGNOSIS — O99330 Smoking (tobacco) complicating pregnancy, unspecified trimester: Secondary | ICD-10-CM | POA: Insufficient documentation

## 2013-10-22 DIAGNOSIS — Z880 Allergy status to penicillin: Secondary | ICD-10-CM | POA: Insufficient documentation

## 2013-10-22 DIAGNOSIS — Z79899 Other long term (current) drug therapy: Secondary | ICD-10-CM | POA: Insufficient documentation

## 2013-10-22 DIAGNOSIS — Z3490 Encounter for supervision of normal pregnancy, unspecified, unspecified trimester: Secondary | ICD-10-CM

## 2013-10-22 DIAGNOSIS — O2390 Unspecified genitourinary tract infection in pregnancy, unspecified trimester: Secondary | ICD-10-CM | POA: Insufficient documentation

## 2013-10-22 DIAGNOSIS — J45909 Unspecified asthma, uncomplicated: Secondary | ICD-10-CM | POA: Insufficient documentation

## 2013-10-22 DIAGNOSIS — F319 Bipolar disorder, unspecified: Secondary | ICD-10-CM | POA: Insufficient documentation

## 2013-10-22 DIAGNOSIS — N390 Urinary tract infection, site not specified: Secondary | ICD-10-CM

## 2013-10-22 DIAGNOSIS — Z792 Long term (current) use of antibiotics: Secondary | ICD-10-CM | POA: Insufficient documentation

## 2013-10-22 DIAGNOSIS — O99340 Other mental disorders complicating pregnancy, unspecified trimester: Secondary | ICD-10-CM | POA: Insufficient documentation

## 2013-10-22 LAB — URINALYSIS, ROUTINE W REFLEX MICROSCOPIC
BILIRUBIN URINE: NEGATIVE
Glucose, UA: NEGATIVE mg/dL
KETONES UR: NEGATIVE mg/dL
Nitrite: POSITIVE — AB
PH: 6 (ref 5.0–8.0)
Protein, ur: >300 mg/dL — AB
Specific Gravity, Urine: >1.03 — ABNORMAL HIGH (ref 1.005–1.030)
Urobilinogen, UA: 0.2 mg/dL (ref 0.0–1.0)

## 2013-10-22 LAB — URINE MICROSCOPIC-ADD ON

## 2013-10-22 MED ORDER — CEFTRIAXONE SODIUM 1 G IJ SOLR
1.0000 g | Freq: Once | INTRAMUSCULAR | Status: AC
  Administered 2013-10-22: 1 g via INTRAMUSCULAR
  Filled 2013-10-22: qty 10

## 2013-10-22 MED ORDER — CEPHALEXIN 500 MG PO CAPS: 500.0000 mg | ORAL_CAPSULE | Freq: Four times a day (QID) | ORAL | Status: DC

## 2013-10-22 MED ORDER — STERILE WATER FOR INJECTION IJ SOLN
INTRAMUSCULAR | Status: AC
  Administered 2013-10-22: 2.1 mL
  Filled 2013-10-22: qty 10

## 2013-10-22 NOTE — Discharge Instructions (Signed)
Pregnancy and Urinary Tract Infection  A urinary tract infection (UTI) is a bacterial infection of the urinary tract. Infection of the urinary tract can include the ureters, kidneys (pyelonephritis), bladder (cystitis), and urethra (urethritis). All pregnant women should be screened for bacteria in the urinary tract. Identifying and treating a UTI will decrease the risk of preterm labor and developing more serious infections in both the mother and baby.  CAUSES  Bacteria germs cause almost all UTIs.   RISK FACTORS  Many factors can increase your chances of getting a UTI during pregnancy. These include:  · Having a short urethra.  · Poor toilet and hygiene habits.  · Sexual intercourse.  · Blockage of urine along the urinary tract.  · Problems with the pelvic muscles or nerves.  · Diabetes.  · Obesity.  · Bladder problems after having several children.  · Previous history of UTI.  SIGNS AND SYMPTOMS   · Pain, burning, or a stinging feeling when urinating.  · Suddenly feeling the need to urinate right away (urgency).  · Loss of bladder control (urinary incontinence).  · Frequent urination, more than is common with pregnancy.  · Lower abdominal or back discomfort.  · Cloudy urine.  · Blood in the urine (hematuria).  · Fever.   When the kidneys are infected, the symptoms may be:  · Back pain.  · Flank pain on the right side more so than the left.  · Fever.  · Chills.  · Nausea.  · Vomiting.  DIAGNOSIS   A urinary tract infection is usually diagnosed through urine tests. Additional tests and procedures are sometimes done. These may include:  · Ultrasound exam of the kidneys, ureters, bladder, and urethra.  · Looking in the bladder with a lighted tube (cystoscopy).  TREATMENT  Typically, UTIs can be treated with antibiotic medicines.   HOME CARE INSTRUCTIONS   · Only take over-the-counter or prescription medicines as directed by your health care provider. If you were prescribed antibiotics, take them as directed. Finish  them even if you start to feel better.  · Drink enough fluids to keep your urine clear or pale yellow.  · Do not have sexual intercourse until the infection is gone and your health care provider says it is okay.  · Make sure you are tested for UTIs throughout your pregnancy. These infections often come back.   Preventing a UTI in the Future  · Practice good toilet habits. Always wipe from front to back. Use the tissue only once.  · Do not hold your urine. Empty your bladder as soon as possible when the urge comes.  · Do not douche or use deodorant sprays.  · Wash with soap and warm water around the genital area and the anus.  · Empty your bladder before and after sexual intercourse.  · Wear underwear with a cotton crotch.  · Avoid caffeine and carbonated drinks. They can irritate the bladder.  · Drink cranberry juice or take cranberry pills. This may decrease the risk of getting a UTI.  · Do not drink alcohol.  · Keep all your appointments and tests as scheduled.   SEEK MEDICAL CARE IF:   · Your symptoms get worse.  · You are still having fevers 2 or more days after treatment begins.  · You have a rash.  · You feel that you are having problems with medicines prescribed.  · You have abnormal vaginal discharge.  SEEK IMMEDIATE MEDICAL CARE IF:   · You have back or flank   pain.  · You have chills.  · You have blood in your urine.  · You have nausea and vomiting.  · You have contractions of your uterus.  · You have a gush of fluid from the vagina.  MAKE SURE YOU:  · Understand these instructions.    · Will watch your condition.    · Will get help right away if you are not doing well or get worse.    Document Released: 08/24/2010 Document Revised: 02/17/2013 Document Reviewed: 11/26/2012  ExitCare® Patient Information ©2014 [employer].

## 2013-10-22 NOTE — ED Notes (Signed)
Vag bleeding when wiped, crying at triage.  No abd pain,

## 2013-10-22 NOTE — Progress Notes (Signed)
Pt to [facility] ER reporting dark red blood when she wipes after bathroom visit.  Pt reports good fetal movement and no leaking of fluid.  Pt states that she has not had intercourse in a while or done anything that she feels would cause the bleeding.  Pt reports no problems so far with this pregnancy.

## 2013-10-22 NOTE — ED Notes (Signed)
[facility] and patient is not having contractions at this time.

## 2013-10-22 NOTE — ED Provider Notes (Signed)
CSN: [id number]     Arrival date & time 10/22/13  2212 History  This chart was scribed for Brittany Creasehristopher J. Pollina, MD by Charline BillsEssence Howell, ED Scribe. The patient was seen in room APA01/APA01. Patient's care was started at 10:32 PM.   Chief Complaint  Patient presents with  . Vaginal Bleeding   The history is provided by the patient. No language interpreter was used.   HPI Comments: Brittany Payne is a 23 y.o. female, 7 months pregnant, who presents to the Emergency Department complaining of vaginal bleeding noted today. She states that she used the restroom and noted dark red blood with wiping. Pt states that she can feel movement, last felt a few minutes ago. She denies a rush of fluid. She also reports associated abdominal cramping. She denies dysuria or any urinary symptoms.  Past Medical History  Diagnosis Date  . Asthma   . Bipolar 1 disorder   . Pregnant    Past Surgical History  Procedure Laterality Date  . Cholecystectomy    . Myringotomy     Family History  Problem Relation Age of Onset  . Hypertension Mother   . Diabetes Father   . Hypertension Father   . Cancer Father   . Diabetes Other    History  Substance Use Topics  . Smoking status: Current Every Day Smoker -- 0.50 packs/day for 8 years    Types: Cigarettes  . Smokeless tobacco: Never Used  . Alcohol Use: No   OB History   Grav Para Term Preterm Abortions TAB SAB Ect Mult Living   2         1     Review of Systems  Gastrointestinal:       Abdominal cramping  Genitourinary: Positive for vaginal bleeding. Negative for dysuria.  All other systems reviewed and are negative.  Allergies  Yellow jacket venom; Amoxicillin; Ketorolac tromethamine; Penicillins; Tramadol; and Vistaril  Home Medications   Prior to Admission medications   Medication Sig Start Date End Date Taking? Authorizing Provider  albuterol (VENTOLIN HFA) 108 (90 BASE) MCG/ACT inhaler Inhale 2 puffs into the lungs every 4 (four) hours as  needed. For shortness of breath   Yes Historical Provider, MD  flintstones complete (FLINTSTONES) 60 MG chewable tablet Chew 1 tablet by mouth daily.   Yes Historical Provider, MD  Fluticasone-Salmeterol (ADVAIR) 100-50 MCG/DOSE AEPB Inhale 1 puff into the lungs every 12 (twelve) hours.     Yes Historical Provider, MD  buPROPion (WELLBUTRIN SR) 150 MG 12 hr tablet Take 150 mg by mouth 2 (two) times daily.    Historical Provider, MD  cephALEXin (KEFLEX) 500 MG capsule Take 1 capsule (500 mg total) by mouth 4 (four) times daily. 10/22/13   Brittany Creasehristopher J. Pollina, MD  montelukast (SINGULAIR) 10 MG tablet Take 10 mg by mouth at bedtime.    Historical Provider, MD  sertraline (ZOLOFT) 25 MG tablet Take 25 mg by mouth 2 (two) times daily.    Historical Provider, MD   Triage Vitals: BP 152/106  Pulse 132  Temp(Src) 97.6 F (36.4 C) (Oral)  Resp 24  Ht 5\' 3"  (1.6 m)  Wt 160 lb (72.576 kg)  BMI 28.35 kg/m2  SpO2 98%  LMP 03/16/2013 Physical Exam  Constitutional: She is oriented to person, place, and time. She appears well-developed and well-nourished.  HENT:  Head: Normocephalic and atraumatic.  Right Ear: Hearing normal.  Left Ear: Hearing normal.  Nose: Nose normal.  Mouth/Throat: Oropharynx is clear and moist  and mucous membranes are normal.  Eyes: Conjunctivae and EOM are normal. Pupils are equal, round, and reactive to light.  Neck: Normal range of motion. Neck supple.  Cardiovascular: Normal rate, regular rhythm, S1 normal and S2 normal.  Exam reveals no gallop and no friction rub.   No murmur heard. Pulmonary/Chest: Effort normal and breath sounds normal. No respiratory distress. She exhibits no tenderness.  Abdominal: Soft. Normal appearance and bowel sounds are normal. There is no hepatosplenomegaly. There is no tenderness. There is no rebound, no guarding, no tenderness at McBurney's point and negative Murphy's sign. No hernia.  Musculoskeletal: Normal range of motion.  Neurological:  She is alert and oriented to person, place, and time. She has normal strength. No cranial nerve deficit or sensory deficit. Coordination normal. GCS eye subscore is 4. GCS verbal subscore is 5. GCS motor subscore is 6.  Skin: Skin is warm, dry and intact. No rash noted. No cyanosis.  Psychiatric: She has a normal mood and affect. Her speech is normal and behavior is normal. Thought content normal.   ED Course  Procedures (including critical care time) DIAGNOSTIC STUDIES: Oxygen Saturation is 98% on RA, normal by my interpretation.    COORDINATION OF CARE: 10:34 PM Discussed treatment plan with pt at bedside and pt agreed to plan.  Labs Review Labs Reviewed  URINALYSIS, ROUTINE W REFLEX MICROSCOPIC - Abnormal; Notable for the following:    Color, Urine AMBER (*)    APPearance CLOUDY (*)    Specific Gravity, Urine >1.030 (*)    Hgb urine dipstick TRACE (*)    Protein, ur >300 (*)    Nitrite POSITIVE (*)    Leukocytes, UA TRACE (*)    All other components within normal limits  URINE MICROSCOPIC-ADD ON - Abnormal; Notable for the following:    Bacteria, UA MANY (*)    All other components within normal limits    Imaging Review No results found.   EKG Interpretation None      MDM   Final diagnoses:  UTI (lower urinary tract infection)  Pregnancy    Patient saw blood on the tissue after she wiped, became concerned about her pregnancy. She is currently 7 months pregnant. She is still feeling movement of the baby. Has not been any fluid rashes. She denies vaginal discharge and bleeding other than the spotting on the tissue tonight. Urinalysis was performed on a catheterized specimen. This urine is clearly infected and cause of the patient's spotting. She has been monitored from an OB standpoint and cleared. I did give her the pelvic exams because there is no ongoing bleeding and she is 7 months pregnant. This initiated on Rocephin here in the ER, will continue Keflex as an  outpatient. Followup with OB/GYN in the morning. Return if symptoms worsen.  I personally performed the services described in this documentation, which was scribed in my presence. The recorded information has been reviewed and is accurate.    Brittany Creasehristopher J. Pollina, MD 10/23/13 [id number]

## 2013-10-22 NOTE — ED Notes (Signed)
Morrie Sheldonshley at [facility] notified that patient is being placed on monitor.  Fetal HR in 170s.

## 2013-10-23 NOTE — Progress Notes (Signed)
Report called from [facility] that pt was being d/c home with UTI.

## 2014-02-02 ENCOUNTER — Encounter (HOSPITAL_COMMUNITY): Payer: Self-pay | Admitting: Emergency Medicine

## 2014-02-02 ENCOUNTER — Emergency Department (HOSPITAL_COMMUNITY)
Admission: EM | Admit: 2014-02-02 | Discharge: 2014-02-02 | Disposition: A | Discharge status: Home / Self Care | Payer: Medicaid Other | Attending: Emergency Medicine | Admitting: Emergency Medicine

## 2014-02-02 DIAGNOSIS — Z3202 Encounter for pregnancy test, result negative: Secondary | ICD-10-CM | POA: Diagnosis not present

## 2014-02-02 DIAGNOSIS — R209 Unspecified disturbances of skin sensation: Secondary | ICD-10-CM | POA: Diagnosis not present

## 2014-02-02 DIAGNOSIS — IMO0002 Reserved for concepts with insufficient information to code with codable children: Secondary | ICD-10-CM | POA: Insufficient documentation

## 2014-02-02 DIAGNOSIS — Z792 Long term (current) use of antibiotics: Secondary | ICD-10-CM | POA: Insufficient documentation

## 2014-02-02 DIAGNOSIS — Z880 Allergy status to penicillin: Secondary | ICD-10-CM | POA: Diagnosis not present

## 2014-02-02 DIAGNOSIS — Z79899 Other long term (current) drug therapy: Secondary | ICD-10-CM | POA: Diagnosis not present

## 2014-02-02 DIAGNOSIS — F319 Bipolar disorder, unspecified: Secondary | ICD-10-CM | POA: Diagnosis not present

## 2014-02-02 DIAGNOSIS — J45909 Unspecified asthma, uncomplicated: Secondary | ICD-10-CM | POA: Insufficient documentation

## 2014-02-02 DIAGNOSIS — F17200 Nicotine dependence, unspecified, uncomplicated: Secondary | ICD-10-CM | POA: Diagnosis not present

## 2014-02-02 DIAGNOSIS — T43205A Adverse effect of unspecified antidepressants, initial encounter: Secondary | ICD-10-CM | POA: Diagnosis not present

## 2014-02-02 DIAGNOSIS — R079 Chest pain, unspecified: Secondary | ICD-10-CM | POA: Diagnosis not present

## 2014-02-02 LAB — RAPID URINE DRUG SCREEN, HOSP PERFORMED
AMPHETAMINES: NOT DETECTED
BENZODIAZEPINES: NOT DETECTED
Barbiturates: NOT DETECTED
COCAINE: NOT DETECTED
Opiates: NOT DETECTED
Tetrahydrocannabinol: POSITIVE — AB

## 2014-02-02 LAB — PREGNANCY, URINE: Preg Test, Ur: NEGATIVE

## 2014-02-02 MED ORDER — LORAZEPAM 2 MG/ML IJ SOLN
1.0000 mg | Freq: Once | INTRAMUSCULAR | Status: AC
  Administered 2014-02-02: 1 mg via INTRAVENOUS
  Filled 2014-02-02: qty 1

## 2014-02-02 MED ORDER — LORAZEPAM 2 MG/ML IJ SOLN: 1.0000 mg | Freq: Once | INTRAMUSCULAR | Status: DC

## 2014-02-02 MED ORDER — LORAZEPAM 1 MG PO TABS: 1.0000 mg | ORAL_TABLET | Freq: Three times a day (TID) | ORAL | Status: DC | PRN

## 2014-02-02 NOTE — ED Provider Notes (Addendum)
CSN: [id number]     Arrival date & time 02/02/14  0028 History   This chart was scribed for Hanley Seamen, MD, by Yevette Edwards, ED Scribe. This patient was seen in room APA18/APA18 and the patient's care was started at 12:36 AM.  None    Chief Complaint  Patient presents with  . Medication Reaction    The history is provided by the patient. No language interpreter was used.   HPI Comments: Brittany Payne is a 23 year-old  female, with a h/o bipolar 1 disorder, who presents to the Emergency Department complaining of a possible adverse reaction to Citalopram 20 mg which she took for the first time four and a half hours ago. She states approximately an hour after taking the medication, she began to experience a sense of impending doom and the sensation that she could not breathe. The pt reports she felt like she was going to die. These were associated with tightness in the chest and paresthesias. The symptoms continue at bedside but have improved. She voices a h/o panic attacks, though she reports this episode was more severe than previous. She also endorses nausea.   Past Medical History  Diagnosis Date  . Asthma   . Bipolar 1 disorder   . Pregnant    Past Surgical History  Procedure Laterality Date  . Cholecystectomy    . Myringotomy     Family History  Problem Relation Age of Onset  . Hypertension Mother   . Diabetes Father   . Hypertension Father   . Cancer Father   . Diabetes Other    History  Substance Use Topics  . Smoking status: Current Every Day Smoker -- 0.50 packs/day for 8 years    Types: Cigarettes  . Smokeless tobacco: Never Used  . Alcohol Use: No   OB History   Grav Para Term Preterm Abortions TAB SAB Ect Mult Living   2         1     Review of Systems  A complete 10 system review of systems was obtained, and all systems were negative except where indicated in the HPI and PE.    Allergies  Yellow jacket venom; Amoxicillin; Ketorolac tromethamine;  Penicillins; Tramadol; and Vistaril  Home Medications   Prior to Admission medications   Medication Sig Start Date End Date Taking? Authorizing Provider  albuterol (VENTOLIN HFA) 108 (90 BASE) MCG/ACT inhaler Inhale 2 puffs into the lungs every 4 (four) hours as needed. For shortness of breath    Historical Provider, MD  buPROPion (WELLBUTRIN SR) 150 MG 12 hr tablet Take 150 mg by mouth 2 (two) times daily.    Historical Provider, MD  cephALEXin (KEFLEX) 500 MG capsule Take 1 capsule (500 mg total) by mouth 4 (four) times daily. 10/22/13   Gilda Crease, MD  flintstones complete (FLINTSTONES) 60 MG chewable tablet Chew 1 tablet by mouth daily.    Historical Provider, MD  Fluticasone-Salmeterol (ADVAIR) 100-50 MCG/DOSE AEPB Inhale 1 puff into the lungs every 12 (twelve) hours.      Historical Provider, MD  montelukast (SINGULAIR) 10 MG tablet Take 10 mg by mouth at bedtime.    Historical Provider, MD  sertraline (ZOLOFT) 25 MG tablet Take 25 mg by mouth 2 (two) times daily.    Historical Provider, MD   Triage Vitals: BP 127/90  Pulse 101  Temp(Src) 98.9 F (37.2 C) (Oral)  Resp 16  Ht  (1.575 m)  Wt 154 lb (69.854  kg)  BMI 28.16 kg/m2  SpO2 100%  LMP 01/31/2014  Breastfeeding? Unknown  Physical Exam  General: Well-developed, well-nourished female in no acute distress; appearance consistent with age of record HENT: normocephalic; atraumatic Eyes: pupils equal, round and reactive to light; extraocular muscles intact Neck: supple Heart: regular rate and rhythm; no murmurs, rubs or gallops Lungs: clear to auscultation bilaterally Abdomen: soft; nondistended; nontender; no masses or hepatosplenomegaly; bowel sounds present Extremities: No deformity; full range of motion; pulses normal Neurologic: Awake, alert and oriented; motor function intact in all extremities and symmetric; no facial droop Skin: Warm and dry; facial comedones  Psychiatric: anxious, tearful   ED  Course  Procedures (including critical care time)  DIAGNOSTIC STUDIES: Oxygen Saturation is 100% on room air, normal by my interpretation.    COORDINATION OF CARE:  12:44 AM- Discussed treatment plan with patient, and the patient agreed to the plan.    MDM   Nursing notes and vitals signs, including pulse oximetry, reviewed.  Summary of this visit's results, reviewed by myself:  Labs:  Results for orders placed during the hospital encounter of 02/02/14 (from the past 24 hour(s))  URINE RAPID DRUG SCREEN (HOSP PERFORMED)     Status: Abnormal   Collection Time    02/02/14  1:14 AM      Result Value Ref Range   Opiates NONE DETECTED  NONE DETECTED   Cocaine NONE DETECTED  NONE DETECTED   Benzodiazepines NONE DETECTED  NONE DETECTED   Amphetamines NONE DETECTED  NONE DETECTED   Tetrahydrocannabinol POSITIVE (*) NONE DETECTED   Barbiturates NONE DETECTED  NONE DETECTED  PREGNANCY, URINE     Status: None   Collection Time    02/02/14  1:14 AM      Result Value Ref Range   Preg Test, Ur NEGATIVE  NEGATIVE     3:34 AM Significant relief with Ativan 1 mg IV. Patient has been sleeping comfortably.  Hanley Seamen, MD 02/02/14 0335  Hanley Seamen, MD 02/02/14 [phone]

## 2014-02-02 NOTE — Discharge Instructions (Signed)
Drug Toxicity °You are having a reaction to your medicine. This does not mean you are allergic to the drug. Medicines can have many different side effects and toxic reactions. These include: °· Stomach symptoms, such as nausea, vomiting, cramps, diarrhea, bloating, constipation, and dry mouth. °· Nervous system symptoms, such as weakness, muscle spasms, drowsiness, confusion, agitation, and headache. °· Heart and blood vessel symptoms, such as fainting, irregular heartbeat (palpitations), and fast heartbeat. °· Skin symptoms, such as itching, light sensitivity, and rashes. °When taking more medicines, there is an increased chance of a drug interaction that can make you sick. Take your medicines as your caregiver recommends. Keep a list of the names and doses of each of your drugs. Avoid alcohol and street drugs. Call your caregiver if you are worried about drug side effects or toxicity. °Document Released: 04/29/2005 Document Revised: 07/22/2011 Document Reviewed: 10/14/2006 °Exit[employer]® Patient Information ©2015 [employer]. This information is not intended to replace advice given to you by your health care provider. Make sure you discuss any questions you have with your health care provider. ° °

## 2014-02-02 NOTE — ED Notes (Signed)
Pt took her first dose of Citalopram tonight, started having anxiety, panic attack, chest discomfort approx 1 hour after taking the med.  Per ems, pt was extremely anxious, but is feeling better now.

## 2014-02-15 ENCOUNTER — Encounter (HOSPITAL_COMMUNITY): Payer: Self-pay | Admitting: Emergency Medicine

## 2014-02-15 ENCOUNTER — Emergency Department (HOSPITAL_COMMUNITY)
Admission: EM | Admit: 2014-02-15 | Discharge: 2014-02-15 | Disposition: A | Discharge status: Home / Self Care | Payer: Medicaid Other | Attending: Emergency Medicine | Admitting: Emergency Medicine

## 2014-02-15 DIAGNOSIS — Z720 Tobacco use: Secondary | ICD-10-CM | POA: Insufficient documentation

## 2014-02-15 DIAGNOSIS — Z7951 Long term (current) use of inhaled steroids: Secondary | ICD-10-CM | POA: Insufficient documentation

## 2014-02-15 DIAGNOSIS — Z79899 Other long term (current) drug therapy: Secondary | ICD-10-CM | POA: Insufficient documentation

## 2014-02-15 DIAGNOSIS — J45909 Unspecified asthma, uncomplicated: Secondary | ICD-10-CM | POA: Insufficient documentation

## 2014-02-15 DIAGNOSIS — L739 Follicular disorder, unspecified: Secondary | ICD-10-CM | POA: Insufficient documentation

## 2014-02-15 DIAGNOSIS — Z880 Allergy status to penicillin: Secondary | ICD-10-CM | POA: Diagnosis not present

## 2014-02-15 DIAGNOSIS — F319 Bipolar disorder, unspecified: Secondary | ICD-10-CM | POA: Insufficient documentation

## 2014-02-15 DIAGNOSIS — L02214 Cutaneous abscess of groin: Secondary | ICD-10-CM | POA: Diagnosis present

## 2014-02-15 MED ORDER — SULFAMETHOXAZOLE-TRIMETHOPRIM 800-160 MG PO TABS: 1.0000 | ORAL_TABLET | Freq: Two times a day (BID) | ORAL | Status: DC

## 2014-02-15 MED ORDER — HYDROCODONE-ACETAMINOPHEN 5-325 MG PO TABS: ORAL_TABLET | ORAL | Status: DC

## 2014-02-15 NOTE — ED Provider Notes (Signed)
CSN: [id number]     Arrival date & time 02/15/14  1127 History   First MD Initiated Contact with Patient 02/15/14 1147     Chief Complaint  Patient presents with  . Abscess     (Consider location/radiation/quality/duration/timing/severity/associated sxs/prior Treatment) HPI   Brittany Payne is a 23 y.o. female who presents to the Emergency Department complaining of open "sore" to her right groin that she noticed two days ago.  She describes the area as "sore to touch" and began draining yellow to green pus one day prior to arrival.  She denies fever, chills, abdominal pain, rash or vomiting.  She states the area is improving, but she is still having pain to the area.     Past Medical History  Diagnosis Date  . Asthma   . Bipolar 1 disorder    Past Surgical History  Procedure Laterality Date  . Cholecystectomy    . Myringotomy     Family History  Problem Relation Age of Onset  . Hypertension Mother   . Diabetes Father   . Hypertension Father   . Cancer Father   . Diabetes Other    History  Substance Use Topics  . Smoking status: Current Every Day Smoker -- 0.50 packs/day for 8 years    Types: Cigarettes  . Smokeless tobacco: Never Used  . Alcohol Use: No   OB History   Grav Para Term Preterm Abortions TAB SAB Ect Mult Living   2         1     Review of Systems  Constitutional: Negative for fever and chills.  Gastrointestinal: Negative for nausea, vomiting and abdominal pain.  Musculoskeletal: Negative for arthralgias and joint swelling.  Skin: Negative for color change.       Skin lesion right groin  Hematological: Negative for adenopathy.  All other systems reviewed and are negative.     Allergies  Yellow jacket venom; Amoxicillin; Celexa; Ketorolac tromethamine; Penicillins; Tramadol; and Vistaril  Home Medications   Prior to Admission medications   Medication Sig Start Date End Date Taking? Authorizing Provider  acetaminophen (TYLENOL) 500 MG tablet  Take 1,000 mg by mouth every 6 (six) hours as needed for moderate pain.   Yes Historical Provider, MD  albuterol (VENTOLIN HFA) 108 (90 BASE) MCG/ACT inhaler Inhale 2 puffs into the lungs every 4 (four) hours as needed. For shortness of breath   Yes Historical Provider, MD  buPROPion (WELLBUTRIN SR) 150 MG 12 hr tablet Take 150 mg by mouth 2 (two) times daily.   Yes Historical Provider, MD  flintstones complete (FLINTSTONES) 60 MG chewable tablet Chew 1 tablet by mouth daily.   Yes Historical Provider, MD  Fluticasone-Salmeterol (ADVAIR) 100-50 MCG/DOSE AEPB Inhale 1 puff into the lungs every 12 (twelve) hours.     Yes Historical Provider, MD  LORazepam (ATIVAN) 1 MG tablet Take 1 tablet (1 mg total) by mouth 3 (three) times daily as needed for anxiety. 02/02/14  Yes John L Molpus, MD  montelukast (SINGULAIR) 10 MG tablet Take 10 mg by mouth at bedtime.   Yes Historical Provider, MD   BP 94/72  Temp(Src) 98.2 F (36.8 C) (Oral)  Resp 16  Ht 5\' 2"  (1.575 m)  Wt 153 lb (69.4 kg)  BMI 27.98 kg/m2  SpO2 100%  LMP 01/24/2014 Physical Exam  Nursing note and vitals reviewed. Constitutional: She is oriented to person, place, and time. She appears well-developed and well-nourished. No distress.  HENT:  Head: Normocephalic and atraumatic.  Cardiovascular: Normal rate, regular rhythm and normal heart sounds.   No murmur heard. Pulmonary/Chest: Effort normal and breath sounds normal. No respiratory distress.  Abdominal: Soft. She exhibits no distension. There is no tenderness. There is no rebound.  Musculoskeletal: Normal range of motion.  Neurological: She is alert and oriented to person, place, and time. She exhibits normal muscle tone. Coordination normal.  Skin: Skin is warm and dry. There is erythema.  Small opening of the skin to the right groin.  No surrounding erythema, fluctuance or induration.  Mild yellow serous drainage    ED Course  Procedures (including critical care time) Labs  Review Labs Reviewed - No data to display  Imaging Review No results found.   EKG Interpretation None      MDM   Final diagnoses:  Folliculitis    Pt is well appearing.  Likely improving folliculitis.  No abscess seen.  Rx for bactrim and #15 vicodin.  Pt stable for d/c and agrees to warm water soaks.      Shloka Baldridge L. Disha Cottam, PA-C 02/17/14 1641

## 2014-02-15 NOTE — Discharge Instructions (Signed)

## 2014-02-15 NOTE — ED Notes (Signed)
Patient given discharge instruction, verbalized understand. Patient ambulatory out of the department.  

## 2014-02-15 NOTE — ED Notes (Signed)
Pt states sore to right groin area. States she noticed pain 2 days ago and sore yesterday. States green drainage came out yesterday. Area is  Not red or inflamed today.

## 2014-02-19 NOTE — ED Provider Notes (Signed)
Medical screening examination/treatment/procedure(s) were performed by non-physician practitioner and as supervising physician I was immediately available for consultation/collaboration.   EKG Interpretation None       Michel Eskelson, MD 02/19/14 1047 

## 2014-03-07 ENCOUNTER — Emergency Department (HOSPITAL_COMMUNITY): Payer: Medicaid Other

## 2014-03-07 ENCOUNTER — Emergency Department (HOSPITAL_COMMUNITY)
Admission: EM | Admit: 2014-03-07 | Discharge: 2014-03-07 | Disposition: A | Discharge status: Home / Self Care | Payer: Medicaid Other | Attending: Emergency Medicine | Admitting: Emergency Medicine

## 2014-03-07 ENCOUNTER — Encounter (HOSPITAL_COMMUNITY): Payer: Self-pay | Admitting: Emergency Medicine

## 2014-03-07 DIAGNOSIS — J40 Bronchitis, not specified as acute or chronic: Secondary | ICD-10-CM

## 2014-03-07 DIAGNOSIS — Z720 Tobacco use: Secondary | ICD-10-CM | POA: Diagnosis not present

## 2014-03-07 DIAGNOSIS — R05 Cough: Secondary | ICD-10-CM

## 2014-03-07 DIAGNOSIS — Z79899 Other long term (current) drug therapy: Secondary | ICD-10-CM | POA: Insufficient documentation

## 2014-03-07 DIAGNOSIS — J45901 Unspecified asthma with (acute) exacerbation: Secondary | ICD-10-CM | POA: Insufficient documentation

## 2014-03-07 DIAGNOSIS — I10 Essential (primary) hypertension: Secondary | ICD-10-CM | POA: Diagnosis not present

## 2014-03-07 DIAGNOSIS — R1110 Vomiting, unspecified: Secondary | ICD-10-CM | POA: Diagnosis not present

## 2014-03-07 DIAGNOSIS — R059 Cough, unspecified: Secondary | ICD-10-CM

## 2014-03-07 DIAGNOSIS — F319 Bipolar disorder, unspecified: Secondary | ICD-10-CM | POA: Insufficient documentation

## 2014-03-07 MED ORDER — BENZONATATE 100 MG PO CAPS: 100.0000 mg | ORAL_CAPSULE | Freq: Three times a day (TID) | ORAL | Status: DC

## 2014-03-07 MED ORDER — BENZONATATE 100 MG PO CAPS
200.0000 mg | ORAL_CAPSULE | Freq: Once | ORAL | Status: AC
  Administered 2014-03-07: 200 mg via ORAL
  Filled 2014-03-07: qty 2

## 2014-03-07 NOTE — ED Notes (Signed)
Cough, white sputum.  Vomiting x3 today, nasal congestion.   Sore throat

## 2014-03-07 NOTE — ED Provider Notes (Signed)
CSN: [id number]     Arrival date & time 03/07/14  1819 History  This chart was scribed for Brittany RollerBrian D Gladine Plude, MD by Modena JanskyAlbert Thayil, ED Scribe. This patient was seen in room APA01/APA01 and the patient's care was started at 7:00 PM.   Chief Complaint  Patient presents with  . Emesis   The history is provided by the patient. No language interpreter was used.   HPI Comments: Brittany Payne is a 23 y.o. female with a hx of asthma who presents to the Emergency Department complaining of moderate intermittent cough that started 3 days ago. She reports she has been feeling sick and initially had a sore throat which is also persistent and moderate. She reports that the cough is productive of white sputum. She states that she has been having some post tussive emesis, along with nasal and chest congestion. She reports that she used Robitussin without any relief. She states that she has a hx of smoking.   Past Medical History  Diagnosis Date  . Asthma   . Bipolar 1 disorder    Past Surgical History  Procedure Laterality Date  . Cholecystectomy    . Myringotomy     Family History  Problem Relation Age of Onset  . Hypertension Mother   . Diabetes Father   . Hypertension Father   . Cancer Father   . Diabetes Other    History  Substance Use Topics  . Smoking status: Current Every Day Smoker -- 0.50 packs/day for 8 years    Types: Cigarettes  . Smokeless tobacco: Never Used  . Alcohol Use: No   OB History   Grav Para Term Preterm Abortions TAB SAB Ect Mult Living   2         1     Review of Systems  HENT: Positive for congestion and sore throat.   Respiratory: Positive for cough.   Gastrointestinal: Positive for vomiting.  All other systems reviewed and are negative.   Allergies  Yellow jacket venom; Amoxicillin; Celexa; Ketorolac tromethamine; Penicillins; Tramadol; and Vistaril  Home Medications   Prior to Admission medications   Medication Sig Start Date End Date Taking?  Authorizing Provider  acetaminophen (TYLENOL) 500 MG tablet Take 1,000 mg by mouth every 6 (six) hours as needed for moderate pain.   Yes Historical Provider, MD  albuterol (VENTOLIN HFA) 108 (90 BASE) MCG/ACT inhaler Inhale 2 puffs into the lungs every 4 (four) hours as needed. For shortness of breath   Yes Historical Provider, MD  buPROPion (WELLBUTRIN SR) 150 MG 12 hr tablet Take 150 mg by mouth 2 (two) times daily.   Yes Historical Provider, MD  flintstones complete (FLINTSTONES) 60 MG chewable tablet Chew 1 tablet by mouth daily.   Yes Historical Provider, MD  Fluticasone-Salmeterol (ADVAIR) 100-50 MCG/DOSE AEPB Inhale 1 puff into the lungs every 12 (twelve) hours.     Yes Historical Provider, MD  guaiFENesin (ROBITUSSIN) 100 MG/5ML SOLN Take 5 mLs by mouth every 4 (four) hours as needed for cough or to loosen phlegm.   Yes Historical Provider, MD  LORazepam (ATIVAN) 1 MG tablet Take 1 tablet (1 mg total) by mouth 3 (three) times daily as needed for anxiety. 02/02/14  Yes John L Molpus, MD  montelukast (SINGULAIR) 10 MG tablet Take 10 mg by mouth at bedtime.   Yes Historical Provider, MD  sertraline (ZOLOFT) 100 MG tablet Take 100 mg by mouth daily.   Yes Historical Provider, MD  benzonatate (TESSALON) 100 MG  capsule Take 1 capsule (100 mg total) by mouth every 8 (eight) hours. 03/07/14   Brittany Roller, MD   BP 139/92  Pulse 81  Temp(Src) 98 F (36.7 C) (Oral)  Resp 24  Ht 5\' 2"  (1.575 m)  Wt 147 lb (66.679 kg)  BMI 26.88 kg/m2  SpO2 100%  LMP 02/16/2014  Breastfeeding? No Physical Exam  Nursing note and vitals reviewed. Constitutional: She appears well-developed and well-nourished. No distress.  HENT:  Head: Normocephalic and atraumatic.  Mouth/Throat: Oropharynx is clear and moist. No oropharyngeal exudate.  Pharyngeal edema.  Eyes: Conjunctivae and EOM are normal. Pupils are equal, round, and reactive to light. Right eye exhibits no discharge. Left eye exhibits no discharge. No  scleral icterus.  Neck: Normal range of motion. Neck supple. No JVD present. No thyromegaly present.  Cardiovascular: Normal rate, regular rhythm, normal heart sounds and intact distal pulses.  Exam reveals no gallop and no friction rub.   No murmur heard. Pulmonary/Chest: Effort normal. No respiratory distress. She has wheezes. She has no rales.  Mild expiratory wheezing.   Abdominal: Soft. Bowel sounds are normal. She exhibits no distension and no mass. There is no tenderness.  Musculoskeletal: Normal range of motion. She exhibits no edema and no tenderness.  Lymphadenopathy:    She has no cervical adenopathy.  Neurological: She is alert. Coordination normal.  Skin: Skin is warm and dry. No rash noted. No erythema.  Psychiatric: She has a normal mood and affect. Her behavior is normal.    ED Course  Procedures (including critical care time) DIAGNOSTIC STUDIES: Oxygen Saturation is 100% on RA, normal by my interpretation.    COORDINATION OF CARE: 7:04 PM- Pt advised of plan for treatment which includes medication and radiology and pt agrees.  Labs Review Labs Reviewed - No data to display  Imaging Review Dg Chest 2 View  03/07/2014   CLINICAL DATA:  23 year old female with history of cough and white sputum production. Three day history of vomiting.  EXAM: CHEST  2 VIEW  COMPARISON:  Chest x-ray 06/02/2012.  FINDINGS: Lung volumes are normal. No consolidative airspace disease. No pleural effusions. No pneumothorax. No pulmonary nodule or mass noted. Pulmonary vasculature and the cardiomediastinal silhouette are within normal limits.  IMPRESSION: No radiographic evidence of acute cardiopulmonary disease.   Electronically Signed   By: Trudie Reed M.D.   On: 03/07/2014 19:26      MDM   Final diagnoses:  Cough  Bronchitis    Overall the patient appears well, her vital signs are normal except for mild hypertension in her chest x-ray shows no signs of acute infiltrates or  pneumothorax. She appears stable for discharge at this time. She will be treated with supportive care for her upper respiratory infection which is likely viral. She states that she already has albuterol treatments at home which she can use to bronchodilator and I will give her a prescription for Tessalon.  Xray neg  Meds given in ED:  Medications  benzonatate (TESSALON) capsule 200 mg (200 mg Oral Given 03/07/14 1906)    Discharge Medication List as of 03/07/2014  7:30 PM    START taking these medications   Details  benzonatate (TESSALON) 100 MG capsule Take 1 capsule (100 mg total) by mouth every 8 (eight) hours., Starting 03/07/2014, Until Discontinued, Print          I personally performed the services described in this documentation, which was scribed in my presence. The recorded information has been  reviewed and is accurate.      Brittany RollerBrian D Marlan Steward, MD 03/07/14 2300

## 2014-03-07 NOTE — ED Notes (Signed)
Pt is back from x-ray

## 2014-03-07 NOTE — Discharge Instructions (Signed)
Albuterol treatment every 4 hours  Tessalon for cough  Your xray is normal - no pneumonia.  Please call your doctor for a followup appointment within 24-48 hours. When you talk to your doctor please let them know that you were seen in the emergency department and have them acquire all of your records so that they can discuss the findings with you and formulate a treatment plan to fully care for your new and ongoing problems.

## 2014-03-14 ENCOUNTER — Encounter (HOSPITAL_COMMUNITY): Payer: Self-pay | Admitting: Emergency Medicine

## 2014-05-22 ENCOUNTER — Encounter (HOSPITAL_COMMUNITY): Payer: Self-pay | Admitting: Emergency Medicine

## 2014-05-22 ENCOUNTER — Emergency Department (HOSPITAL_COMMUNITY)
Admission: EM | Admit: 2014-05-22 | Discharge: 2014-05-22 | Disposition: A | Discharge status: Home / Self Care | Payer: Medicaid Other | Attending: Emergency Medicine | Admitting: Emergency Medicine

## 2014-05-22 DIAGNOSIS — Z79899 Other long term (current) drug therapy: Secondary | ICD-10-CM | POA: Insufficient documentation

## 2014-05-22 DIAGNOSIS — J45909 Unspecified asthma, uncomplicated: Secondary | ICD-10-CM | POA: Diagnosis not present

## 2014-05-22 DIAGNOSIS — Z7952 Long term (current) use of systemic steroids: Secondary | ICD-10-CM | POA: Diagnosis not present

## 2014-05-22 DIAGNOSIS — F319 Bipolar disorder, unspecified: Secondary | ICD-10-CM | POA: Diagnosis not present

## 2014-05-22 DIAGNOSIS — N390 Urinary tract infection, site not specified: Secondary | ICD-10-CM | POA: Insufficient documentation

## 2014-05-22 DIAGNOSIS — M545 Low back pain: Secondary | ICD-10-CM | POA: Diagnosis present

## 2014-05-22 DIAGNOSIS — Z720 Tobacco use: Secondary | ICD-10-CM | POA: Insufficient documentation

## 2014-05-22 DIAGNOSIS — Z880 Allergy status to penicillin: Secondary | ICD-10-CM | POA: Diagnosis not present

## 2014-05-22 DIAGNOSIS — Z9049 Acquired absence of other specified parts of digestive tract: Secondary | ICD-10-CM | POA: Insufficient documentation

## 2014-05-22 LAB — URINALYSIS, ROUTINE W REFLEX MICROSCOPIC
Bilirubin Urine: NEGATIVE
GLUCOSE, UA: NEGATIVE mg/dL
Ketones, ur: NEGATIVE mg/dL
NITRITE: NEGATIVE
PH: 6.5 (ref 5.0–8.0)
Protein, ur: NEGATIVE mg/dL
UROBILINOGEN UA: 0.2 mg/dL (ref 0.0–1.0)

## 2014-05-22 LAB — PREGNANCY, URINE: Preg Test, Ur: NEGATIVE

## 2014-05-22 LAB — URINE MICROSCOPIC-ADD ON

## 2014-05-22 MED ORDER — NITROFURANTOIN MONOHYD MACRO 100 MG PO CAPS: 100.0000 mg | ORAL_CAPSULE | Freq: Two times a day (BID) | ORAL | Status: DC

## 2014-05-22 MED ORDER — NITROFURANTOIN MONOHYD MACRO 100 MG PO CAPS
100.0000 mg | ORAL_CAPSULE | Freq: Once | ORAL | Status: AC
  Administered 2014-05-22: 100 mg via ORAL
  Filled 2014-05-22: qty 1

## 2014-05-22 NOTE — ED Provider Notes (Signed)
CSN: [id number]     Arrival date & time 05/22/14  1616 History  This chart was scribed for non-physician practitioner Vilinda Blanks, PA-C working with Donnetta Hutching, MD by Murriel Hopper, ED Scribe. This patient was seen in room APFT22/APFT22 and the patient's care was started at 5:36 PM.    Chief Complaint  Patient presents with  . Back Pain    The history is provided by the patient. No language interpreter was used.     HPI Comments: Brittany Payne is a 24 y.o. female who presents to the Emergency Department complaining of intermittent right lower back pain with associated urinary frequency that began 2-3 days ago while pt was urinating. Pt states that she woke up in the early morning hours to urinate, and states that she did not have a steady stream. Pt describes the flow of her urine as, "drizzling". Pt then went back to sleep, and awoke a few hours later to urinate again. Pt states that she felt pain and pressure in her lower back while she was urinating. Pt states she has been drinking a lot of water recently, and has taken ibuprofen with little relief. In addition, pt complains of a skin irritation on her right temple that has been present for two days. Pt states she believes it was a spider bite, and reports yellow-green drainage from the area. Pt denies hematuria,  nausea, and vomiting and denies fever.    Past Medical History  Diagnosis Date  . Asthma   . Bipolar 1 disorder    Past Surgical History  Procedure Laterality Date  . Cholecystectomy    . Myringotomy     Family History  Problem Relation Age of Onset  . Hypertension Mother   . Diabetes Father   . Hypertension Father   . Cancer Father   . Diabetes Other    History  Substance Use Topics  . Smoking status: Current Every Day Smoker -- 0.50 packs/day for 8 years    Types: Cigarettes  . Smokeless tobacco: Never Used  . Alcohol Use: No   OB History    Gravida Para Term Preterm AB TAB SAB Ectopic Multiple Living   Review of Systems  Constitutional: Negative for fever and chills.  Gastrointestinal: Negative for nausea, vomiting and abdominal pain.  Genitourinary: Positive for frequency. Negative for dysuria and hematuria.  Musculoskeletal: Positive for back pain.  Skin: Positive for color change.      Allergies  Yellow jacket venom; Amoxicillin; Celexa; Ketorolac tromethamine; Penicillins; Tramadol; and Vistaril  Home Medications   Prior to Admission medications   Medication Sig Start Date End Date Taking? Authorizing Provider  acetaminophen (TYLENOL) 500 MG tablet Take 1,000 mg by mouth every 6 (six) hours as needed for moderate pain.    Historical Provider, MD  albuterol (VENTOLIN HFA) 108 (90 BASE) MCG/ACT inhaler Inhale 2 puffs into the lungs every 4 (four) hours as needed. For shortness of breath    Historical Provider, MD  benzonatate (TESSALON) 100 MG capsule Take 1 capsule (100 mg total) by mouth every 8 (eight) hours. 03/07/14   Vida Roller, MD  buPROPion (WELLBUTRIN SR) 150 MG 12 hr tablet Take 150 mg by mouth 2 (two) times daily.    Historical Provider, MD  flintstones complete (FLINTSTONES) 60 MG chewable tablet Chew 1 tablet by mouth daily.    Historical Provider, MD  Fluticasone-Salmeterol (ADVAIR) 100-50 MCG/DOSE AEPB  Inhale 1 puff into the lungs every 12 (twelve) hours.      Historical Provider, MD  guaiFENesin (ROBITUSSIN) 100 MG/5ML SOLN Take 5 mLs by mouth every 4 (four) hours as needed for cough or to loosen phlegm.    Historical Provider, MD  LORazepam (ATIVAN) 1 MG tablet Take 1 tablet (1 mg total) by mouth 3 (three) times daily as needed for anxiety. 02/02/14   John L Molpus, MD  montelukast (SINGULAIR) 10 MG tablet Take 10 mg by mouth at bedtime.    Historical Provider, MD  nitrofurantoin, macrocrystal-monohydrate, (MACROBID) 100 MG capsule Take 1 capsule (100 mg total) by mouth 2 (two) times daily. 05/22/14   Burgess Amor, PA-C  sertraline (ZOLOFT) 100 MG tablet  Take 100 mg by mouth daily.    Historical Provider, MD   BP 110/64 mmHg  Pulse 83  Temp(Src) 98.3 F (36.8 C) (Oral)  Resp 20  Ht  (1.549 m)  Wt 152 lb (68.947 kg)  BMI 28.74 kg/m2  SpO2 100%  LMP 05/05/2014 Physical Exam  Constitutional: She is oriented to person, place, and time. She appears well-developed and well-nourished.  Non-toxic appearance. No distress.  HENT:  Head: Normocephalic and atraumatic.  Eyes: Conjunctivae, EOM and lids are normal. Pupils are equal, round, and reactive to light.  Neck: Normal range of motion. Neck supple. No tracheal deviation present. No thyroid mass present.  Cardiovascular: Normal rate, regular rhythm and normal heart sounds.  Exam reveals no gallop.   No murmur heard. Pulmonary/Chest: Effort normal and breath sounds normal. No stridor. No respiratory distress. She has no decreased breath sounds. She has no wheezes. She has no rhonchi. She has no rales.  Abdominal: Soft. Normal appearance and bowel sounds are normal. She exhibits no distension. There is no tenderness. There is no rebound and no CVA tenderness.  Right CVA tenderness Mild generalized abdominal tenderness without guarding or rebound No increased tympany  Normal bowel sounds  Musculoskeletal: Normal range of motion. She exhibits no edema or tenderness.  Neurological: She is alert and oriented to person, place, and time. She has normal strength. No cranial nerve deficit or sensory deficit. GCS eye subscore is 4. GCS verbal subscore is 5. GCS motor subscore is 6.  Skin: Skin is warm and dry. No abrasion and no rash noted.  Half-centimeter scab at right temple which is not draining No edema or fluctuance Appears to be healing skin infection No surrounding erythema  Irritation in lip creases which appears to be chapping/ skin irritation   Psychiatric: She has a normal mood and affect. Her speech is normal and behavior is normal.  Nursing note and vitals reviewed.   ED Course   Procedures (including critical care time)  DIAGNOSTIC STUDIES: Oxygen Saturation is 100% on RA, normal by my interpretation.    COORDINATION OF CARE: 5:50 PM Discussed treatment plan with pt at bedside and pt agreed to plan.   Labs Review Labs Reviewed  URINALYSIS, ROUTINE W REFLEX MICROSCOPIC - Abnormal; Notable for the following:    APPearance HAZY (*)    Specific Gravity, Urine <1.005 (*)    Hgb urine dipstick SMALL (*)    Leukocytes, UA LARGE (*)    All other components within normal limits  URINE MICROSCOPIC-ADD ON - Abnormal; Notable for the following:    Squamous Epithelial / LPF FEW (*)    Bacteria, UA FEW (*)    All other components within normal limits  URINE CULTURE  PREGNANCY, URINE  Imaging Review No results found.   EKG Interpretation None      MDM   Final diagnoses:  UTI (lower urinary tract infection)   Patients labs and/or radiological studies were viewed and considered during the medical decision making and disposition process. Urine cx ordered.  Pt placed on macrobid, first dose given here.  Advised recheck for worsened pain, fevers, vomiting.  Continue with increased fluid intake.    I personally performed the services described in this documentation, which was scribed in my presence. The recorded information has been reviewed and is accurate.   Burgess AmorJulie Borghild Thaker, PA-C 05/22/14 1851  Donnetta HutchingBrian Cook, MD 05/22/14 2322

## 2014-05-22 NOTE — ED Notes (Signed)
Patient c/o low back pain. Denies any known injury. Per patient pressure with urination. Per patient frequent urination. Denies noting any blood or fevers. Patient also c/o possible spider bite to right temple. Reports green drainage.

## 2014-05-22 NOTE — Discharge Instructions (Signed)

## 2014-05-25 LAB — URINE CULTURE: Colony Count: >=100000

## 2014-05-26 ENCOUNTER — Telehealth (HOSPITAL_BASED_OUTPATIENT_CLINIC_OR_DEPARTMENT_OTHER): Payer: Self-pay | Admitting: Emergency Medicine

## 2014-05-26 NOTE — Telephone Encounter (Signed)
Post ED Visit - Positive Culture Follow-up  Culture report reviewed by antimicrobial stewardship pharmacist: []  Wes Dulaney, Pharm.D., BCPS []  Celedonio MiyamotoJeremy Frens, Pharm.D., BCPS []  Georgina PillionElizabeth Martin, Pharm.D., BCPS []  Great [place]aponMinh Pham, [address]Ph[address]D., BCPS, AAHIVP []  Estella HuskMichelle Turner, Pharm.D., BCPS, AAHIVP [x]  Elder CyphersLorie Poole, [address]Pharm[address]D., BCPS  Positive urine culture E. Coli Treated with Nitrofurantoin , organism sensitive to the same and no further patient follow-up is required at this time.  Berle MullMiller, Binyamin Nelis 05/26/2014, 3:36 PM

## 2014-09-11 ENCOUNTER — Emergency Department (HOSPITAL_COMMUNITY): Payer: Medicaid Other

## 2014-09-11 ENCOUNTER — Emergency Department (HOSPITAL_COMMUNITY)
Admission: EM | Admit: 2014-09-11 | Discharge: 2014-09-11 | Disposition: A | Discharge status: Home / Self Care | Payer: Medicaid Other | Attending: Emergency Medicine | Admitting: Emergency Medicine

## 2014-09-11 ENCOUNTER — Encounter (HOSPITAL_COMMUNITY): Payer: Self-pay | Admitting: Emergency Medicine

## 2014-09-11 DIAGNOSIS — Z7951 Long term (current) use of inhaled steroids: Secondary | ICD-10-CM | POA: Diagnosis not present

## 2014-09-11 DIAGNOSIS — Z880 Allergy status to penicillin: Secondary | ICD-10-CM | POA: Diagnosis not present

## 2014-09-11 DIAGNOSIS — Z79899 Other long term (current) drug therapy: Secondary | ICD-10-CM | POA: Insufficient documentation

## 2014-09-11 DIAGNOSIS — R059 Cough, unspecified: Secondary | ICD-10-CM

## 2014-09-11 DIAGNOSIS — H65192 Other acute nonsuppurative otitis media, left ear: Secondary | ICD-10-CM | POA: Diagnosis not present

## 2014-09-11 DIAGNOSIS — L739 Follicular disorder, unspecified: Secondary | ICD-10-CM | POA: Diagnosis not present

## 2014-09-11 DIAGNOSIS — Z792 Long term (current) use of antibiotics: Secondary | ICD-10-CM | POA: Diagnosis not present

## 2014-09-11 DIAGNOSIS — J029 Acute pharyngitis, unspecified: Secondary | ICD-10-CM | POA: Diagnosis not present

## 2014-09-11 DIAGNOSIS — Z720 Tobacco use: Secondary | ICD-10-CM | POA: Diagnosis not present

## 2014-09-11 DIAGNOSIS — R05 Cough: Secondary | ICD-10-CM | POA: Insufficient documentation

## 2014-09-11 DIAGNOSIS — F319 Bipolar disorder, unspecified: Secondary | ICD-10-CM | POA: Insufficient documentation

## 2014-09-11 DIAGNOSIS — J45909 Unspecified asthma, uncomplicated: Secondary | ICD-10-CM | POA: Diagnosis not present

## 2014-09-11 DIAGNOSIS — M791 Myalgia: Secondary | ICD-10-CM | POA: Diagnosis not present

## 2014-09-11 MED ORDER — GUAIFENESIN-CODEINE 100-10 MG/5ML PO SYRP: 10.0000 mL | ORAL_SOLUTION | Freq: Three times a day (TID) | ORAL | Status: DC | PRN

## 2014-09-11 MED ORDER — AZITHROMYCIN 250 MG PO TABS: 250.0000 mg | ORAL_TABLET | Freq: Every day | ORAL | Status: DC

## 2014-09-11 NOTE — Discharge Instructions (Signed)
Cough, Adult ° A cough is a reflex. It helps you clear your throat and airways. A cough can help heal your body. A cough can last 2 or 3 weeks (acute) or may last more than 8 weeks (chronic). Some common causes of a cough can include an infection, allergy, or a cold. °HOME CARE °· Only take medicine as told by your doctor. °· If given, take your medicines (antibiotics) as told. Finish them even if you start to feel better. °· Use a cold steam vaporizer or humidifier in your home. This can help loosen thick spit (secretions). °· Sleep so you are almost sitting up (semi-upright). Use pillows to do this. This helps reduce coughing. °· Rest as needed. °· Stop smoking if you smoke. °GET HELP RIGHT AWAY IF: °· You have yellowish-white fluid (pus) in your thick spit. °· Your cough gets worse. °· Your medicine does not reduce coughing, and you are losing sleep. °· You cough up blood. °· You have trouble breathing. °· Your pain gets worse and medicine does not help. °· You have a fever. °MAKE SURE YOU:  °· Understand these instructions. °· Will watch your condition. °· Will get help right away if you are not doing well or get worse. °Document Released: 01/10/2011 Document Revised: 09/13/2013 Document Reviewed: 01/10/2011 °[employer]® Patient Information ©2015 [employer]. This information is not intended to replace advice given to you by your health care provider. Make sure you discuss any questions you have with your health care provider. ° °Otitis Media °Otitis media is redness, soreness, and puffiness (swelling) in the space just behind your eardrum (middle ear). It may be caused by allergies or infection. It often happens along with a cold. °HOME CARE °· Take your medicine as told. Finish it even if you start to feel better. °· Only take over-the-counter or prescription medicines for pain, discomfort, or fever as told by your doctor. °· Follow up with your doctor as told. °GET HELP IF: °· You have otitis media only in  one ear, or bleeding from your nose, or both. °· You notice a lump on your neck. °· You are not getting better in 3-5 days. °· You feel worse instead of better. °GET HELP RIGHT AWAY IF:  °· You have pain that is not helped with medicine. °· You have puffiness, redness, or pain around your ear. °· You get a stiff neck. °· You cannot move part of your face (paralysis). °· You notice that the bone behind your ear hurts when you touch it. °MAKE SURE YOU:  °· Understand these instructions. °· Will watch your condition. °· Will get help right away if you are not doing well or get worse. °Document Released: 10/16/2007 Document Revised: 05/04/2013 Document Reviewed: 11/24/2012 °[employer]® Patient Information ©2015 [employer]. This information is not intended to replace advice given to you by your health care provider. Make sure you discuss any questions you have with your health care provider. ° °

## 2014-09-11 NOTE — ED Notes (Addendum)
Pt from home. Pt states she has had a cough for about 3-4 days and had chills and reports having a fever of 103 degrees last night. She reports that last night she had to sleep sitting up so she didn't cough. She has a sore throat that she describes as "scratchy". Pt also has a rash in her right axilla, but states that she just switched brands of deodorant.

## 2014-09-13 NOTE — ED Provider Notes (Signed)
CSN: [id number]     Arrival date & time 09/11/14  1001 History   First MD Initiated Contact with Patient 09/11/14 1046     Chief Complaint  Patient presents with  . Cough     (Consider location/radiation/quality/duration/timing/severity/associated sxs/prior Treatment) HPI  Brittany Payne is a 24 y.o. female who presents to the Emergency Department complaining of cough for 4 days.  She states the cough has been productive at times and associated with a fever.  Reports fever of 103 at home on the evening prior to arrival.  She also notes a sore throat which she associates with coughing.  She states cough is worse with lying down and talking.  She has tried OTC cough medications without relief.  She denies chest pain, shortness of breath, vomiting or bloody sputum.  She also notes having a rash to her right axilla for several days after switching deodorants.  She denies redness, swelling to the underarm or drainage.  She has not tried any therapies    Past Medical History  Diagnosis Date  . Asthma   . Bipolar 1 disorder    Past Surgical History  Procedure Laterality Date  . Cholecystectomy    . Myringotomy     Family History  Problem Relation Age of Onset  . Hypertension Mother   . Diabetes Father   . Hypertension Father   . Cancer Father   . Diabetes Other    History  Substance Use Topics  . Smoking status: Current Every Day Smoker -- 0.25 packs/day for 8 years    Types: Cigarettes  . Smokeless tobacco: Never Used  . Alcohol Use: No   OB History    Gravida Para Term Preterm AB TAB SAB Ectopic Multiple Living   2 2 2       2      Review of Systems  Constitutional: Positive for fever. Negative for chills and appetite change.  HENT: Positive for congestion and sore throat. Negative for trouble swallowing.   Respiratory: Positive for cough. Negative for chest tightness, shortness of breath and wheezing.   Cardiovascular: Negative for chest pain.  Gastrointestinal: Negative  for nausea, vomiting and abdominal pain.  Genitourinary: Negative for dysuria.  Musculoskeletal: Positive for myalgias. Negative for arthralgias.  Skin: Positive for rash.  Neurological: Negative for dizziness, weakness and numbness.  Hematological: Negative for adenopathy.  All other systems reviewed and are negative.     Allergies  Yellow jacket venom; Amoxicillin; Celexa; Ketorolac tromethamine; Penicillins; Tramadol; and Vistaril  Home Medications   Prior to Admission medications   Medication Sig Start Date End Date Taking? Authorizing Provider  acetaminophen (TYLENOL) 500 MG tablet Take 1,000 mg by mouth every 6 (six) hours as needed for moderate pain.   Yes Historical Provider, MD  albuterol (VENTOLIN HFA) 108 (90 BASE) MCG/ACT inhaler Inhale 2 puffs into the lungs every 4 (four) hours as needed. For shortness of breath   Yes Historical Provider, MD  buPROPion (WELLBUTRIN SR) 150 MG 12 hr tablet Take 150 mg by mouth 2 (two) times daily.   Yes Historical Provider, MD  flintstones complete (FLINTSTONES) 60 MG chewable tablet Chew 1 tablet by mouth daily.   Yes Historical Provider, MD  Fluticasone-Salmeterol (ADVAIR) 100-50 MCG/DOSE AEPB Inhale 1 puff into the lungs every 12 (twelve) hours.     Yes Historical Provider, MD  guaiFENesin (ROBITUSSIN) 100 MG/5ML SOLN Take 5 mLs by mouth every 4 (four) hours as needed for cough or to loosen phlegm.  Yes Historical Provider, MD  LORazepam (ATIVAN) 1 MG tablet Take 1 tablet (1 mg total) by mouth 3 (three) times daily as needed for anxiety. 02/02/14  Yes John Molpus, MD  montelukast (SINGULAIR) 10 MG tablet Take 10 mg by mouth at bedtime.   Yes Historical Provider, MD  sertraline (ZOLOFT) 100 MG tablet Take 100 mg by mouth at bedtime.    Yes Historical Provider, MD  azithromycin (ZITHROMAX) 250 MG tablet Take 1 tablet (250 mg total) by mouth daily. Take first 2 tablets together, then 1 every day until finished. 09/11/14   Laylynn Campanella, PA-C   benzonatate (TESSALON) 100 MG capsule Take 1 capsule (100 mg total) by mouth every 8 (eight) hours. Patient not taking: Reported on 09/11/2014 03/07/14   Eber Hong, MD  guaiFENesin-codeine [facility]) 100-10 MG/5ML syrup Take 10 mLs by mouth 3 (three) times daily as needed. 09/11/14   Runette Scifres, PA-C  nitrofurantoin, macrocrystal-monohydrate, (MACROBID) 100 MG capsule Take 1 capsule (100 mg total) by mouth 2 (two) times daily. Patient not taking: Reported on 09/11/2014 05/22/14   Burgess Amor, PA-C   BP 116/83 mmHg  Pulse 66  Temp(Src) 97.5 F (36.4 C) (Oral)  Resp 20  Ht  (1.575 m)  Wt 152 lb (68.947 kg)  BMI 27.79 kg/m2  SpO2 100%  LMP 08/29/2014 Physical Exam  Constitutional: She is oriented to person, place, and time. She appears well-developed and well-nourished. No distress.  HENT:  Head: Normocephalic and atraumatic.  Right Ear: Tympanic membrane and ear canal normal.  Left Ear: Tympanic membrane and ear canal normal.  Mouth/Throat: Uvula is midline, oropharynx is clear and moist and mucous membranes are normal. No oropharyngeal exudate.  Eyes: EOM are normal. Pupils are equal, round, and reactive to light.  Neck: Normal range of motion, full passive range of motion without pain and phonation normal. Neck supple.  Cardiovascular: Normal rate, regular rhythm, normal heart sounds and intact distal pulses.   No murmur heard. Pulmonary/Chest: Effort normal. No stridor. No respiratory distress. She has no wheezes. She has no rales. She exhibits no tenderness.  Coarse lungs sounds bilaterally.  No rales or wheezing  Abdominal: Soft. She exhibits no distension. There is no tenderness.  Musculoskeletal: She exhibits no edema.  Lymphadenopathy:    She has no cervical adenopathy.  Neurological: She is alert and oriented to person, place, and time. She exhibits normal muscle tone. Coordination normal.  Skin: Skin is warm and dry. Rash noted.  Scattered slightly erythematous  papules to the right axilla.  No pustules or edema  Nursing note and vitals reviewed.   ED Course  Procedures (including critical care time) Labs Review Labs Reviewed - No data to display  Imaging Review Dg Chest 2 View  09/11/2014   CLINICAL DATA:  Fever and cough  EXAM: CHEST  2 VIEW  COMPARISON:  March 07, 2014  FINDINGS: Lungs are clear. Heart size and pulmonary vascularity are normal. No adenopathy. No bone lesions.  IMPRESSION: No edema or consolidation.   Electronically Signed   By: Bretta Bang III M.D.   On: 09/11/2014 10:45      EKG Interpretation None      MDM   Final diagnoses:  Other acute nonsuppurative otitis media of left ear  Cough  Folliculitis    Pt is well appearing, non-toxic.  vitals stable.  XR neg for PNA.  Pt agrees to continue albuterol tx as needed, fluids and close PMD f/u if needed.  Appears stable  for d/c   Pauline Auslett, PA-C 09/13/14 1544  Mancel Bale, MD 09/15/14 1345

## 2014-09-18 ENCOUNTER — Emergency Department (HOSPITAL_COMMUNITY)
Admission: EM | Admit: 2014-09-18 | Discharge: 2014-09-18 | Disposition: A | Discharge status: Home / Self Care | Payer: Medicaid Other | Attending: Emergency Medicine | Admitting: Emergency Medicine

## 2014-09-18 ENCOUNTER — Emergency Department (HOSPITAL_COMMUNITY): Payer: Medicaid Other

## 2014-09-18 ENCOUNTER — Encounter (HOSPITAL_COMMUNITY): Payer: Self-pay | Admitting: Emergency Medicine

## 2014-09-18 DIAGNOSIS — S83412A Sprain of medial collateral ligament of left knee, initial encounter: Secondary | ICD-10-CM | POA: Insufficient documentation

## 2014-09-18 DIAGNOSIS — Y998 Other external cause status: Secondary | ICD-10-CM | POA: Diagnosis not present

## 2014-09-18 DIAGNOSIS — Y9289 Other specified places as the place of occurrence of the external cause: Secondary | ICD-10-CM | POA: Insufficient documentation

## 2014-09-18 DIAGNOSIS — F319 Bipolar disorder, unspecified: Secondary | ICD-10-CM | POA: Insufficient documentation

## 2014-09-18 DIAGNOSIS — Z880 Allergy status to penicillin: Secondary | ICD-10-CM | POA: Diagnosis not present

## 2014-09-18 DIAGNOSIS — Z792 Long term (current) use of antibiotics: Secondary | ICD-10-CM | POA: Insufficient documentation

## 2014-09-18 DIAGNOSIS — S8992XA Unspecified injury of left lower leg, initial encounter: Secondary | ICD-10-CM | POA: Diagnosis present

## 2014-09-18 DIAGNOSIS — Z79899 Other long term (current) drug therapy: Secondary | ICD-10-CM | POA: Diagnosis not present

## 2014-09-18 DIAGNOSIS — X58XXXA Exposure to other specified factors, initial encounter: Secondary | ICD-10-CM | POA: Insufficient documentation

## 2014-09-18 DIAGNOSIS — J45909 Unspecified asthma, uncomplicated: Secondary | ICD-10-CM | POA: Insufficient documentation

## 2014-09-18 DIAGNOSIS — Z720 Tobacco use: Secondary | ICD-10-CM | POA: Diagnosis not present

## 2014-09-18 DIAGNOSIS — Y9301 Activity, walking, marching and hiking: Secondary | ICD-10-CM | POA: Diagnosis not present

## 2014-09-18 MED ORDER — NAPROXEN 500 MG PO TABS: 500.0000 mg | ORAL_TABLET | Freq: Two times a day (BID) | ORAL | Status: DC

## 2014-09-18 MED ORDER — HYDROCODONE-ACETAMINOPHEN 5-325 MG PO TABS: 1.0000 | ORAL_TABLET | ORAL | Status: DC | PRN

## 2014-09-18 MED ORDER — NAPROXEN 250 MG PO TABS
500.0000 mg | ORAL_TABLET | Freq: Once | ORAL | Status: AC
  Administered 2014-09-18: 500 mg via ORAL
  Filled 2014-09-18: qty 2

## 2014-09-18 NOTE — ED Notes (Signed)
Pt verbalized understanding of no driving and to use caution within 4 hours of taking pain meds due to meds cause drowsiness 

## 2014-09-18 NOTE — ED Notes (Signed)
Patient brought in via EMS. Alert and oriented. Airway patent. No acute distress noted. Patient c/o left knee pain. Per patient was walking when knee "locked up" causing her to fall on knee at a "funny angle." Patient reports feeling a pop. Denies hitting head. No obvious deformity noted.

## 2014-09-18 NOTE — Discharge Instructions (Signed)
Ligament Sprain Ligaments are tough, fibrous tissues that hold bones together at the joints. A sprain can occur when a ligament is stretched. This injury may take several weeks to heal. HOME CARE INSTRUCTIONS   Rest the injured area for as long as directed by your caregiver. Then slowly start using the joint as directed by your caregiver and as the pain allows.  Keep the affected joint raised if possible to lessen swelling.  Apply ice for 15-20 minutes to the injured area every couple hours for the first half day, then 03-04 times per day for the first 48 hours. Put the ice in a plastic bag and place a towel between the bag of ice and your skin.  After the 1st 48 hours, you may apply a heating pad to the site 20 minutes 3 times daily.  Wear any splinting, casting, or elastic bandage applications as instructed.  Only take over-the-counter or prescription medicines for pain, discomfort, or fever as directed by your caregiver. Do not use aspirin immediately after the injury unless instructed by your caregiver. Aspirin can cause increased bleeding and bruising of the tissues.  If you were given crutches, continue to use them as instructed and do not resume weight bearing on the affected extremity until instructed. SEEK MEDICAL CARE IF:   Your bruising, swelling, or pain increases.  You have cold and numb fingers or toes if your arm or leg was injured. SEEK IMMEDIATE MEDICAL CARE IF:   Your toes are numb or blue if your leg was injured.  Your fingers are numb or blue if your arm was injured.  Your pain is not responding to medicines and continues to stay the same or gets worse. MAKE SURE YOU:   Understand these instructions.  Will watch your condition.  Will get help right away if you are not doing well or get worse. Document Released: 04/26/2000 Document Revised: 07/22/2011 Document Reviewed: 02/23/2008 [facility] Patient Information 2015 [place], [place]. This information is not  intended to replace advice given to you by your health care provider. Make sure you discuss any questions you have with your health care provider.   Wear the ace wrap and use the crutches provided until you are able to bear weight without causing increased pain.   You may take the hydrocodone prescribed for pain relief for the next 2 days, after which the naproxen should be controlling your pain sufficiently.  This will make you drowsy - do not drive within 4 hours of taking this medication.    Call your doctor for a recheck of your injury in 1 week.  You may benefit from physical therapy of your ankle if it is not getting better over the next week.

## 2014-09-18 NOTE — ED Provider Notes (Signed)
CSN: [id number]     Arrival date & time 09/18/14  0909 History   First MD Initiated Contact with Patient 09/18/14 [phone]     Chief Complaint  Patient presents with  . Knee Pain     (Consider location/radiation/quality/duration/timing/severity/associated sxs/prior Treatment) The history is provided by the patient.   Brittany Payne is a 24 y.o. female presenting with sudden onset of left medial knee pain while walking to work this morning.  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  This am felt a "popping" sensation followed by the right knee locking briefly, then falling onto the right knee.  She was able to bear weight after the event.  She denies radiation of pain which is constant and worsened with movement and palpation.  She has had no medicines prior to arrival for this problem.     Past Medical History  Diagnosis Date  . Asthma   . Bipolar 1 disorder    Past Surgical History  Procedure Laterality Date  . Cholecystectomy    . Myringotomy     Family History  Problem Relation Age of Onset  . Hypertension Mother   . Diabetes Father   . Hypertension Father   . Cancer Father   . Diabetes Other    History  Substance Use Topics  . Smoking status: Current Every Day Smoker -- 0.25 packs/day for 8 years    Types: Cigarettes  . Smokeless tobacco: Never Used  . Alcohol Use: No   OB History    Gravida Para Term Preterm AB TAB SAB Ectopic Multiple Living   2 2 2       2      Review of Systems  Constitutional: Negative for fever.  Musculoskeletal: Positive for arthralgias. Negative for myalgias and joint swelling.  Neurological: Negative for weakness and numbness.      Allergies  Yellow jacket venom; Amoxicillin; Celexa; Ketorolac tromethamine; Penicillins; Tramadol; and Vistaril  Home Medications   Prior to Admission medications   Medication Sig Start Date End Date  Taking? Authorizing Provider  acetaminophen (TYLENOL) 500 MG tablet Take 1,000 mg by mouth every 6 (six) hours as needed for moderate pain.   Yes Historical Provider, MD  albuterol (VENTOLIN HFA) 108 (90 BASE) MCG/ACT inhaler Inhale 2 puffs into the lungs every 4 (four) hours as needed. For shortness of breath   Yes Historical Provider, MD  azithromycin (ZITHROMAX) 250 MG tablet Take 1 tablet (250 mg total) by mouth daily. Take first 2 tablets together, then 1 every day until finished. 09/11/14  Yes Tammy Triplett, PA-C  buPROPion (WELLBUTRIN SR) 150 MG 12 hr tablet Take 150 mg by mouth 2 (two) times daily.   Yes Historical Provider, MD  flintstones complete (FLINTSTONES) 60 MG chewable tablet Chew 1 tablet by mouth daily.   Yes Historical Provider, MD  Fluticasone-Salmeterol (ADVAIR) 100-50 MCG/DOSE AEPB Inhale 1 puff into the lungs every 12 (twelve) hours.     Yes Historical Provider, MD  LORazepam (ATIVAN) 1 MG tablet Take 1 tablet (1 mg total) by mouth 3 (three) times daily as needed for anxiety. 02/02/14  Yes John Molpus, MD  montelukast (SINGULAIR) 10 MG tablet Take 10 mg by mouth at bedtime.   Yes Historical Provider, MD  sertraline (ZOLOFT) 100 MG tablet Take 100 mg by mouth at bedtime.    Yes Historical Provider, MD  benzonatate (TESSALON) 100 MG  capsule Take 1 capsule (100 mg total) by mouth every 8 (eight) hours. Patient not taking: Reported on 09/11/2014 03/07/14   Eber HongBrian Miller, MD  guaiFENesin-codeine [facility](ROBITUSSIN AC) 100-10 MG/5ML syrup Take 10 mLs by mouth 3 (three) times daily as needed. Patient not taking: Reported on 09/18/2014 09/11/14   Tammy Triplett, PA-C  HYDROcodone-acetaminophen (NORCO/VICODIN) 5-325 MG per tablet Take 1 tablet by mouth every 4 (four) hours as needed for severe pain. 09/18/14   Burgess AmorJulie Daysen Gundrum, PA-C  naproxen (NAPROSYN) 500 MG tablet Take 1 tablet (500 mg total) by mouth 2 (two) times daily. 09/18/14   Burgess AmorJulie Holiday Mcmenamin, PA-C  nitrofurantoin, macrocrystal-monohydrate, (MACROBID) 100  MG capsule Take 1 capsule (100 mg total) by mouth 2 (two) times daily. Patient not taking: Reported on 09/11/2014 05/22/14   Burgess AmorJulie Gershom Brobeck, PA-C   BP 117/80 mmHg  Pulse 102  Temp(Src) 98.6 F (37 C)  Resp 20  Ht 5\' 2"  (1.575 m)  Wt 160 lb (72.576 kg)  BMI 29.26 kg/m2  SpO2 98%  LMP 08/29/2014 Physical Exam  Constitutional: She appears well-developed and well-nourished.  HENT:  Head: Atraumatic.  Neck: Normal range of motion.  Cardiovascular:  Pulses equal bilaterally  Musculoskeletal:       Left knee: She exhibits no swelling, no effusion, no ecchymosis, normal alignment, no LCL laxity and no MCL laxity. Tenderness found. Medial joint line tenderness noted.  TTP medial left knee along the course of the MCL.  No palpable deformity or edema.  Pain with valgus strain, but no increased laxity.  Pt unable to tolerate Lachman testing.  Neurological: She is alert. She has normal strength. She displays normal reflexes. No sensory deficit.  Skin: Skin is warm and dry.  Psychiatric: She has a normal mood and affect.    ED Course  Procedures (including critical care time) Labs Review Labs Reviewed - No data to display  Imaging Review Dg Knee Complete 4 Views Left  09/18/2014   CLINICAL DATA:  Walking to work, left knee popped and pt fell, pain medial knee and swelling  EXAM: LEFT KNEE - COMPLETE 4+ VIEW  COMPARISON:  04/13/2012  FINDINGS: There is no evidence of fracture, dislocation, or joint effusion. There is no evidence of arthropathy or other focal bone abnormality. Soft tissues are unremarkable.  IMPRESSION: Negative.   Electronically Signed   By: Amie Portlandavid  Ormond M.D.   On: 09/18/2014 10:15     EKG Interpretation None      MDM   Final diagnoses:  Medial collateral ligament sprain of knee, left, initial encounter    Patients labs and/or radiological studies were reviewed and considered during the medical decision making and disposition process.  Results were also discussed with  patient. Pt was placed in an ace wrap, crutches provided, RICE, f/u with pcp for a recheck in one week if not improved.  Naproxen, few hydrocodone provided.    Burgess AmorJulie Cuinn Westerhold, PA-C 09/18/14 1029  Rolland PorterMark James, MD 09/26/14 [phone]

## 2014-10-22 ENCOUNTER — Emergency Department (HOSPITAL_COMMUNITY)
Admission: EM | Admit: 2014-10-22 | Discharge: 2014-10-22 | Disposition: A | Discharge status: Home / Self Care | Payer: Medicaid Other | Attending: Emergency Medicine | Admitting: Emergency Medicine

## 2014-10-22 ENCOUNTER — Encounter (HOSPITAL_COMMUNITY): Payer: Self-pay | Admitting: *Deleted

## 2014-10-22 DIAGNOSIS — F319 Bipolar disorder, unspecified: Secondary | ICD-10-CM | POA: Insufficient documentation

## 2014-10-22 DIAGNOSIS — Z720 Tobacco use: Secondary | ICD-10-CM | POA: Insufficient documentation

## 2014-10-22 DIAGNOSIS — Z9049 Acquired absence of other specified parts of digestive tract: Secondary | ICD-10-CM | POA: Insufficient documentation

## 2014-10-22 DIAGNOSIS — J45909 Unspecified asthma, uncomplicated: Secondary | ICD-10-CM | POA: Insufficient documentation

## 2014-10-22 DIAGNOSIS — Z880 Allergy status to penicillin: Secondary | ICD-10-CM | POA: Diagnosis not present

## 2014-10-22 DIAGNOSIS — R109 Unspecified abdominal pain: Secondary | ICD-10-CM | POA: Diagnosis present

## 2014-10-22 DIAGNOSIS — Z7951 Long term (current) use of inhaled steroids: Secondary | ICD-10-CM | POA: Diagnosis not present

## 2014-10-22 DIAGNOSIS — K529 Noninfective gastroenteritis and colitis, unspecified: Secondary | ICD-10-CM | POA: Diagnosis not present

## 2014-10-22 DIAGNOSIS — Z79899 Other long term (current) drug therapy: Secondary | ICD-10-CM | POA: Insufficient documentation

## 2014-10-22 DIAGNOSIS — Z3202 Encounter for pregnancy test, result negative: Secondary | ICD-10-CM | POA: Insufficient documentation

## 2014-10-22 LAB — URINE MICROSCOPIC-ADD ON

## 2014-10-22 LAB — CBC WITH DIFFERENTIAL/PLATELET
Basophils Absolute: 0 10*3/uL (ref 0.0–0.1)
Basophils Relative: 0 % (ref 0–1)
EOS ABS: 0.2 10*3/uL (ref 0.0–0.7)
Eosinophils Relative: 2 % (ref 0–5)
HCT: 34.6 % — ABNORMAL LOW (ref 36.0–46.0)
HEMOGLOBIN: 11.5 g/dL — AB (ref 12.0–15.0)
LYMPHS ABS: 1.1 10*3/uL (ref 0.7–4.0)
LYMPHS PCT: 13 % (ref 12–46)
MCH: 29.4 pg (ref 26.0–34.0)
MCHC: 33.2 g/dL (ref 30.0–36.0)
MCV: 88.5 fL (ref 78.0–100.0)
MONO ABS: 0.5 10*3/uL (ref 0.1–1.0)
MONOS PCT: 6 % (ref 3–12)
NEUTROS PCT: 79 % — AB (ref 43–77)
Neutro Abs: 6.5 10*3/uL (ref 1.7–7.7)
PLATELETS: 123 10*3/uL — AB (ref 150–400)
RBC: 3.91 MIL/uL (ref 3.87–5.11)
RDW: 14.7 % (ref 11.5–15.5)
WBC: 8.2 10*3/uL (ref 4.0–10.5)

## 2014-10-22 LAB — COMPREHENSIVE METABOLIC PANEL
ALT: 9 U/L — ABNORMAL LOW (ref 14–54)
AST: 11 U/L — ABNORMAL LOW (ref 15–41)
Albumin: 3.7 g/dL (ref 3.5–5.0)
Alkaline Phosphatase: 54 U/L (ref 38–126)
Anion gap: 8 (ref 5–15)
BILIRUBIN TOTAL: 0.7 mg/dL (ref 0.3–1.2)
BUN: 7 mg/dL (ref 6–20)
CALCIUM: 8.6 mg/dL — AB (ref 8.9–10.3)
CO2: 23 mmol/L (ref 22–32)
Chloride: 108 mmol/L (ref 101–111)
Creatinine, Ser: 0.49 mg/dL (ref 0.44–1.00)
GFR calc Af Amer: >60 mL/min (ref >60)
Glucose, Bld: 94 mg/dL (ref 65–99)
POTASSIUM: 3.2 mmol/L — AB (ref 3.5–5.1)
SODIUM: 139 mmol/L (ref 135–145)
Total Protein: 6.6 g/dL (ref 6.5–8.1)

## 2014-10-22 LAB — URINALYSIS, ROUTINE W REFLEX MICROSCOPIC
Bilirubin Urine: NEGATIVE
Glucose, UA: NEGATIVE mg/dL
Ketones, ur: NEGATIVE mg/dL
NITRITE: NEGATIVE
PROTEIN: NEGATIVE mg/dL
Specific Gravity, Urine: 1.02 (ref 1.005–1.030)
UROBILINOGEN UA: 0.2 mg/dL (ref 0.0–1.0)
pH: 6 (ref 5.0–8.0)

## 2014-10-22 LAB — LIPASE, BLOOD: LIPASE: 17 U/L — AB (ref 22–51)

## 2014-10-22 LAB — PREGNANCY, URINE: PREG TEST UR: NEGATIVE

## 2014-10-22 MED ORDER — SODIUM CHLORIDE 0.9 % IV BOLUS (SEPSIS)
1000.0000 mL | Freq: Once | INTRAVENOUS | Status: AC
  Administered 2014-10-22: 1000 mL via INTRAVENOUS

## 2014-10-22 MED ORDER — SODIUM CHLORIDE 0.9 % IV SOLN: 1000.0000 mL | Freq: Once | INTRAVENOUS | Status: DC

## 2014-10-22 MED ORDER — ONDANSETRON HCL 4 MG/2ML IJ SOLN
4.0000 mg | Freq: Once | INTRAMUSCULAR | Status: AC
  Administered 2014-10-22: 4 mg via INTRAVENOUS
  Filled 2014-10-22: qty 2

## 2014-10-22 MED ORDER — PROMETHAZINE HCL 25 MG PO TABS: 25.0000 mg | ORAL_TABLET | Freq: Four times a day (QID) | ORAL | Status: DC | PRN

## 2014-10-22 MED ORDER — MORPHINE SULFATE 2 MG/ML IJ SOLN
2.0000 mg | Freq: Once | INTRAMUSCULAR | Status: AC
  Administered 2014-10-22: 2 mg via INTRAVENOUS
  Filled 2014-10-22: qty 1

## 2014-10-22 NOTE — Discharge Instructions (Signed)
Clear liquids today. Medication for nausea. Rest.

## 2014-10-22 NOTE — ED Notes (Signed)
MD at bedside. 

## 2014-10-22 NOTE — ED Notes (Signed)
Pt comes in by EMS for epigastric pain starting 2 days ago. Yesterday she had episodes of vomiting and today is only having nausea. Pt was treated with z-pack for sinus infection. Pt is in NAD

## 2014-10-22 NOTE — ED Provider Notes (Signed)
CSN: [id number]     Arrival date & time 10/22/14  0746 History  This chart was scribed for Donnetta Hutching, MD by Elon Spanner, ED Scribe. This patient was seen in room APA17/APA17 and the patient's care was started at 8:16 AM.    Chief Complaint  Patient presents with  . Abdominal Pain   The history is provided by the patient. No language interpreter was used.   HPI Comments: Brittany Payne is a 24 y.o. female who presents to the Emergency Department complaining of vomiting with onset and resolution yesterday.  However, she developed nausea this morning and lightheadedness as she was preparing to smoke a cigarette.  Patient has been unable to tolerate food or fluids since onset.  She denies history of chronic medical conditions with the exception of bipolar 1 disorder for which she takes Wellbutrin and Seroquel.  Patient denies possibility of pregnancy.  Patient reports a history of cholecystectomy at age 40.  LNMP: current.   No dysuria, fever, flank pain   Past Medical History  Diagnosis Date  . Asthma   . Bipolar 1 disorder    Past Surgical History  Procedure Laterality Date  . Cholecystectomy    . Myringotomy     Family History  Problem Relation Age of Onset  . Hypertension Mother   . Diabetes Father   . Hypertension Father   . Cancer Father   . Diabetes Other    History  Substance Use Topics  . Smoking status: Current Every Day Smoker -- 0.25 packs/day for 8 years    Types: Cigarettes  . Smokeless tobacco: Never Used  . Alcohol Use: No   OB History    Gravida Para Term Preterm AB TAB SAB Ectopic Multiple Living   Review of Systems A complete 10 system review of systems was obtained and all systems are negative except as noted in the HPI and PMH.   Allergies  Yellow jacket venom; Amoxicillin; Celexa; Ketorolac tromethamine; Penicillins; Tramadol; and Vistaril  Home Medications   Prior to Admission medications   Medication Sig Start Date End Date  Taking? Authorizing Provider  acetaminophen (TYLENOL) 500 MG tablet Take 1,000 mg by mouth every 6 (six) hours as needed for moderate pain.   Yes Historical Provider, MD  albuterol (VENTOLIN HFA) 108 (90 BASE) MCG/ACT inhaler Inhale 2 puffs into the lungs every 4 (four) hours as needed. For shortness of breath   Yes Historical Provider, MD  buPROPion (WELLBUTRIN SR) 100 MG 12 hr tablet Take 200 mg by mouth 2 (two) times daily.   Yes Historical Provider, MD  flintstones complete (FLINTSTONES) 60 MG chewable tablet Chew 1 tablet by mouth daily.   Yes Historical Provider, MD  Fluticasone-Salmeterol (ADVAIR) 100-50 MCG/DOSE AEPB Inhale 1 puff into the lungs every 12 (twelve) hours.     Yes Historical Provider, MD  montelukast (SINGULAIR) 10 MG tablet Take 10 mg by mouth at bedtime.   Yes Historical Provider, MD  sertraline (ZOLOFT) 100 MG tablet Take 100 mg by mouth at bedtime.    Yes Historical Provider, MD  promethazine (PHENERGAN) 25 MG tablet Take 1 tablet (25 mg total) by mouth every 6 (six) hours as needed. 10/22/14   Donnetta Hutching, MD   BP 119/84 mmHg  Pulse 51  Temp(Src) 98 F (36.7 C) (Oral)  Resp 18  Ht  (1.6 m)  Wt 149 lb (67.586 kg)  BMI 26.40  kg/m2  SpO2 98%  LMP 10/18/2014 Physical Exam  Constitutional: She is oriented to person, place, and time. She appears well-developed and well-nourished.  HENT:  Head: Normocephalic and atraumatic.  Eyes: Conjunctivae and EOM are normal. Pupils are equal, round, and reactive to light.  Neck: Normal range of motion. Neck supple.  Cardiovascular: Normal rate and regular rhythm.   Pulmonary/Chest: Effort normal and breath sounds normal.  Abdominal: Soft. Bowel sounds are normal. There is tenderness (epigastrium).  Musculoskeletal: Normal range of motion.  Neurological: She is alert and oriented to person, place, and time.  Skin: Skin is warm and dry.  Psychiatric: She has a normal mood and affect. Her behavior is normal.  Nursing note and  vitals reviewed.   ED Course  Procedures (including critical care time)  DIAGNOSTIC STUDIES: Oxygen Saturation is 100% on RA, normal by my interpretation.    COORDINATION OF CARE:  8:22 AM Will order IV fluids, labs, and Zofran.  Patient acknowledges and agrees with plan.    Labs Review Labs Reviewed  CBC WITH DIFFERENTIAL/PLATELET - Abnormal; Notable for the following:    Hemoglobin 11.5 (*)    HCT 34.6 (*)    Platelets 123 (*)    Neutrophils Relative % 79 (*)    All other components within normal limits  COMPREHENSIVE METABOLIC PANEL - Abnormal; Notable for the following:    Potassium 3.2 (*)    Calcium 8.6 (*)    AST 11 (*)    ALT 9 (*)    All other components within normal limits  LIPASE, BLOOD - Abnormal; Notable for the following:    Lipase 17 (*)    All other components within normal limits  URINALYSIS, ROUTINE W REFLEX MICROSCOPIC (NOT AT [facility]) - Abnormal; Notable for the following:    APPearance CLOUDY (*)    Hgb urine dipstick LARGE (*)    Leukocytes, UA SMALL (*)    All other components within normal limits  URINE MICROSCOPIC-ADD ON - Abnormal; Notable for the following:    Squamous Epithelial / LPF FEW (*)    Bacteria, UA MANY (*)    All other components within normal limits  PREGNANCY, URINE    Imaging Review No results found.   EKG Interpretation None      MDM   Final diagnoses:  Gastroenteritis    Patient feels better after 2 L of IV fluids. Discharge medication Phenergan 25 mg. Will obtain urine culture. Patient has no urinary symptoms.  I personally performed the services described in this documentation, which was scribed in my presence. The recorded information has been reviewed and is accurate.    Donnetta Hutching, MD 10/22/14 1040

## 2014-10-23 LAB — URINE CULTURE
Colony Count: 70000
SPECIAL REQUESTS: NORMAL

## 2014-10-24 ENCOUNTER — Emergency Department (HOSPITAL_COMMUNITY)
Admission: EM | Admit: 2014-10-24 | Discharge: 2014-10-24 | Disposition: A | Discharge status: Home / Self Care | Payer: Medicaid Other | Attending: Emergency Medicine | Admitting: Emergency Medicine

## 2014-10-24 ENCOUNTER — Encounter (HOSPITAL_COMMUNITY): Payer: Self-pay | Admitting: Cardiology

## 2014-10-24 DIAGNOSIS — Z3202 Encounter for pregnancy test, result negative: Secondary | ICD-10-CM | POA: Insufficient documentation

## 2014-10-24 DIAGNOSIS — Z7952 Long term (current) use of systemic steroids: Secondary | ICD-10-CM | POA: Diagnosis not present

## 2014-10-24 DIAGNOSIS — Z9049 Acquired absence of other specified parts of digestive tract: Secondary | ICD-10-CM | POA: Diagnosis not present

## 2014-10-24 DIAGNOSIS — R102 Pelvic and perineal pain: Secondary | ICD-10-CM | POA: Diagnosis not present

## 2014-10-24 DIAGNOSIS — Z8659 Personal history of other mental and behavioral disorders: Secondary | ICD-10-CM | POA: Insufficient documentation

## 2014-10-24 DIAGNOSIS — R109 Unspecified abdominal pain: Secondary | ICD-10-CM | POA: Diagnosis present

## 2014-10-24 DIAGNOSIS — J45909 Unspecified asthma, uncomplicated: Secondary | ICD-10-CM | POA: Insufficient documentation

## 2014-10-24 DIAGNOSIS — N938 Other specified abnormal uterine and vaginal bleeding: Secondary | ICD-10-CM | POA: Diagnosis not present

## 2014-10-24 DIAGNOSIS — Z880 Allergy status to penicillin: Secondary | ICD-10-CM | POA: Insufficient documentation

## 2014-10-24 DIAGNOSIS — Z79899 Other long term (current) drug therapy: Secondary | ICD-10-CM | POA: Insufficient documentation

## 2014-10-24 DIAGNOSIS — R1031 Right lower quadrant pain: Secondary | ICD-10-CM | POA: Diagnosis not present

## 2014-10-24 DIAGNOSIS — Z720 Tobacco use: Secondary | ICD-10-CM | POA: Insufficient documentation

## 2014-10-24 LAB — URINE MICROSCOPIC-ADD ON

## 2014-10-24 LAB — URINALYSIS, ROUTINE W REFLEX MICROSCOPIC
Bilirubin Urine: NEGATIVE
GLUCOSE, UA: NEGATIVE mg/dL
KETONES UR: NEGATIVE mg/dL
Leukocytes, UA: NEGATIVE
NITRITE: NEGATIVE
PROTEIN: NEGATIVE mg/dL
Specific Gravity, Urine: 1.015 (ref 1.005–1.030)
Urobilinogen, UA: 0.2 mg/dL (ref 0.0–1.0)
pH: 6 (ref 5.0–8.0)

## 2014-10-24 LAB — CBC WITH DIFFERENTIAL/PLATELET
BASOS PCT: 0 % (ref 0–1)
Basophils Absolute: 0 10*3/uL (ref 0.0–0.1)
Eosinophils Absolute: 0.2 10*3/uL (ref 0.0–0.7)
Eosinophils Relative: 4 % (ref 0–5)
HCT: 33 % — ABNORMAL LOW (ref 36.0–46.0)
Hemoglobin: 11.2 g/dL — ABNORMAL LOW (ref 12.0–15.0)
LYMPHS ABS: 1 10*3/uL (ref 0.7–4.0)
LYMPHS PCT: 22 % (ref 12–46)
MCH: 29.9 pg (ref 26.0–34.0)
MCHC: 33.9 g/dL (ref 30.0–36.0)
MCV: 88 fL (ref 78.0–100.0)
Monocytes Absolute: 0.3 10*3/uL (ref 0.1–1.0)
Monocytes Relative: 6 % (ref 3–12)
NEUTROS ABS: 2.9 10*3/uL (ref 1.7–7.7)
Neutrophils Relative %: 67 % (ref 43–77)
Platelets: 115 10*3/uL — ABNORMAL LOW (ref 150–400)
RBC: 3.75 MIL/uL — ABNORMAL LOW (ref 3.87–5.11)
RDW: 14.6 % (ref 11.5–15.5)
WBC: 4.4 10*3/uL (ref 4.0–10.5)

## 2014-10-24 LAB — BASIC METABOLIC PANEL
Anion gap: 9 (ref 5–15)
BUN: 5 mg/dL — AB (ref 6–20)
CO2: 23 mmol/L (ref 22–32)
Calcium: 8.2 mg/dL — ABNORMAL LOW (ref 8.9–10.3)
Chloride: 108 mmol/L (ref 101–111)
Creatinine, Ser: 0.56 mg/dL (ref 0.44–1.00)
GFR calc Af Amer: >60 mL/min (ref >60)
GLUCOSE: 88 mg/dL (ref 65–99)
Potassium: 3.2 mmol/L — ABNORMAL LOW (ref 3.5–5.1)
Sodium: 140 mmol/L (ref 135–145)

## 2014-10-24 LAB — POC URINE PREG, ED: PREG TEST UR: NEGATIVE

## 2014-10-24 LAB — WET PREP, GENITAL
Clue Cells Wet Prep HPF POC: NONE SEEN
TRICH WET PREP: NONE SEEN
WBC, Wet Prep HPF POC: NONE SEEN
YEAST WET PREP: NONE SEEN

## 2014-10-24 MED ORDER — NAPROXEN 500 MG PO TABS: 500.0000 mg | ORAL_TABLET | Freq: Two times a day (BID) | ORAL | Status: DC

## 2014-10-24 MED ORDER — HYDROCODONE-ACETAMINOPHEN 5-325 MG PO TABS: ORAL_TABLET | ORAL | Status: DC

## 2014-10-24 NOTE — ED Notes (Signed)
Right sided abdominal pain since last night.

## 2014-10-24 NOTE — Discharge Instructions (Signed)
Pelvic Pain Pelvic pain is pain felt below the belly button and between your hips. It can be caused by many different things. It is important to get help right away. This is especially true for severe, sharp, or unusual pain that comes on suddenly.  HOME CARE  Only take medicine as told by your doctor.  Rest as told by your doctor.  Eat a healthy diet, such as fruits, vegetables, and lean meats.  Drink enough fluids to keep your pee (urine) clear or pale yellow, or as told.  Avoid sex (intercourse) if it causes pain.  Apply warm or cold packs to your lower belly (abdomen). Use the type of pack that helps the pain.  Avoid situations that cause you stress.  Keep a journal to track your pain. Write down:  When the pain started.  Where it is located.  If there are things that seem to be related to the pain, such as food or your period.  Follow up with your doctor as told. GET HELP RIGHT AWAY IF:   You have heavy bleeding from the vagina.  You have more pelvic pain.  You feel lightheaded or pass out (faint).  You have chills.  You have pain when you pee or have blood in your pee.  You cannot stop having watery poop (diarrhea).  You cannot stop throwing up (vomiting).  You have a fever or lasting symptoms for more than 3 days.  You have a fever and your symptoms suddenly get worse.  You are being physically or sexually abused.  Your medicine does not help your pain.  You have fluid (discharge) coming from your vagina that is not normal. MAKE SURE YOU:  Understand these instructions.  Will watch your condition.  Will get help if you are not doing well or get worse. Document Released: 10/16/2007 Document Revised: 10/29/2011 Document Reviewed: 08/19/2011 [employer] Patient Information 2015 [employer]. This information is not intended to replace advice given to you by your health care provider. Make sure you discuss any questions you have with your health care  provider.  

## 2014-10-24 NOTE — ED Provider Notes (Signed)
CSN: [id number]     Arrival date & time 10/24/14  0802 History   First MD Initiated Contact with Patient 10/24/14 [phone]     Chief Complaint  Patient presents with  . Abdominal Pain     (Consider location/radiation/quality/duration/timing/severity/associated sxs/prior Treatment) HPI   Brittany Payne is a 24 y.o. female who presents to the Emergency Department complaining of right lower quadrant pain that began suddenly yesterday.  She was seen here two days ago and previous provider noted abdominal pain, but patient states that is was chest pain that has since resolved.  She describes this pain as sudden in onset and sharp in quality.  Pain is worse with walking and described as constant.  Nothing makes the pain better.  She states that she is currently on her menstrual period.  She denies vomiting, diarrhea, dysuria, and fever, vaginal pain or discharge    Past Medical History  Diagnosis Date  . Asthma   . Bipolar 1 disorder    Past Surgical History  Procedure Laterality Date  . Cholecystectomy    . Myringotomy     Family History  Problem Relation Age of Onset  . Hypertension Mother   . Diabetes Father   . Hypertension Father   . Cancer Father   . Diabetes Other    History  Substance Use Topics  . Smoking status: Current Every Day Smoker -- 0.25 packs/day for 8 years    Types: Cigarettes  . Smokeless tobacco: Never Used  . Alcohol Use: No   OB History    Gravida Para Term Preterm AB TAB SAB Ectopic Multiple Living   Review of Systems  Constitutional: Negative for fever, chills and appetite change.  Respiratory: Negative for shortness of breath.   Cardiovascular: Negative for chest pain.  Gastrointestinal: Positive for abdominal pain. Negative for nausea, vomiting, diarrhea and blood in stool.  Genitourinary: Positive for vaginal bleeding. Negative for dysuria, flank pain, decreased urine volume, vaginal discharge and difficulty urinating.   Pt is menstruating  Musculoskeletal: Negative for back pain.  Skin: Negative for color change and rash.  Neurological: Negative for dizziness, weakness and numbness.  Hematological: Negative for adenopathy.  All other systems reviewed and are negative.     Allergies  Yellow jacket venom; Amoxicillin; Celexa; Ketorolac tromethamine; Penicillins; Tramadol; and Vistaril  Home Medications   Prior to Admission medications   Medication Sig Start Date End Date Taking? Authorizing Provider  acetaminophen (TYLENOL) 500 MG tablet Take 1,000 mg by mouth every 6 (six) hours as needed for moderate pain.    Historical Provider, MD  albuterol (VENTOLIN HFA) 108 (90 BASE) MCG/ACT inhaler Inhale 2 puffs into the lungs every 4 (four) hours as needed. For shortness of breath    Historical Provider, MD  buPROPion (WELLBUTRIN SR) 100 MG 12 hr tablet Take 200 mg by mouth 2 (two) times daily.    Historical Provider, MD  flintstones complete (FLINTSTONES) 60 MG chewable tablet Chew 1 tablet by mouth daily.    Historical Provider, MD  Fluticasone-Salmeterol (ADVAIR) 100-50 MCG/DOSE AEPB Inhale 1 puff into the lungs every 12 (twelve) hours.      Historical Provider, MD  montelukast (SINGULAIR) 10 MG tablet Take 10 mg by mouth at bedtime.    Historical Provider, MD  promethazine (PHENERGAN) 25 MG tablet Take 1 tablet (25 mg total) by mouth every 6 (six) hours as needed. 10/22/14   Donnetta Hutching,  MD  sertraline (ZOLOFT) 100 MG tablet Take 100 mg by mouth at bedtime.     Historical Provider, MD   BP 125/77 mmHg  Pulse 97  Temp(Src) 97.6 F (36.4 C) (Oral)  Resp 18  Ht  (1.549 m)  Wt 149 lb (67.586 kg)  BMI 28.17 kg/m2  SpO2 99%  LMP 10/18/2014 Physical Exam  Constitutional: She is oriented to person, place, and time. She appears well-developed and well-nourished. No distress.  HENT:  Head: Normocephalic and atraumatic.  Mouth/Throat: Oropharynx is clear and moist.  Cardiovascular: Normal rate, regular  rhythm, normal heart sounds and intact distal pulses.   No murmur heard. Pulmonary/Chest: Effort normal and breath sounds normal. No respiratory distress.  Abdominal: Soft. Normal appearance and bowel sounds are normal. She exhibits no distension and no mass. There is no hepatomegaly. There is tenderness in the right lower quadrant. There is no rigidity, no rebound, no guarding and no CVA tenderness.    ttp at RLQ, no guarding.  abd is otherwise soft, NT  Genitourinary: Uterus normal. Cervix exhibits no motion tenderness. Right adnexum displays tenderness. Right adnexum displays no mass and no fullness. Left adnexum displays no mass, no tenderness and no fullness. There is bleeding in the vagina.  Tenderness at the right adnexa.  No masses.  No CMT. Pt is menstruating, small amt of blood in vaginal vault  Musculoskeletal: Normal range of motion. She exhibits no edema.  Lymphadenopathy:       Right: No inguinal adenopathy present.       Left: No inguinal adenopathy present.  Neurological: She is alert and oriented to person, place, and time. She exhibits normal muscle tone. Coordination normal.  Skin: Skin is warm and dry.  Nursing note and vitals reviewed.   ED Course  Procedures (including critical care time) Labs Review Labs Reviewed  URINALYSIS, ROUTINE W REFLEX MICROSCOPIC (NOT AT [facility]) - Abnormal; Notable for the following:    Hgb urine dipstick LARGE (*)    All other components within normal limits  CBC WITH DIFFERENTIAL/PLATELET - Abnormal; Notable for the following:    RBC 3.75 (*)    Hemoglobin 11.2 (*)    HCT 33.0 (*)    Platelets 115 (*)    All other components within normal limits  BASIC METABOLIC PANEL - Abnormal; Notable for the following:    Potassium 3.2 (*)    BUN 5 (*)    Calcium 8.2 (*)    All other components within normal limits  URINE MICROSCOPIC-ADD ON - Abnormal; Notable for the following:    Squamous Epithelial / LPF MANY (*)    Bacteria, UA FEW (*)     All other components within normal limits  WET PREP, GENITAL  POC URINE PREG, ED  GC/CHLAMYDIA PROBE AMP ([place]) NOT AT [facility]    Imaging Review No results found.   EKG Interpretation None      MDM   Final diagnoses:  Pelvic pain in female    Pt is well appearing.  Ambulating in the room without distress.  Vitals stable.  Clinical suspicion for acute abdomen, including TOA or torsion is low.  Pain felt to be related to mittelschmerz vs ovarian cystic pain  Discussed pt history and care plan with dr. Patria Mane.    Pt appears stable for d/c and agrees to GYN f/u this week if the pain is not improving.  Also given return precautions  VSS.   Pauline Aus, PA-C 10/25/14 1124  [address],  MD 10/25/14 1434

## 2014-10-25 LAB — GC/CHLAMYDIA PROBE AMP (~~LOC~~) NOT AT ARMC
Chlamydia: NEGATIVE
Neisseria Gonorrhea: NEGATIVE

## 2014-10-27 ENCOUNTER — Encounter (HOSPITAL_COMMUNITY): Payer: Self-pay | Admitting: Cardiology

## 2014-10-27 ENCOUNTER — Emergency Department (HOSPITAL_COMMUNITY): Payer: Medicaid Other

## 2014-10-27 ENCOUNTER — Emergency Department (HOSPITAL_COMMUNITY)
Admission: EM | Admit: 2014-10-27 | Discharge: 2014-10-27 | Disposition: A | Discharge status: Home / Self Care | Payer: Medicaid Other | Attending: Emergency Medicine | Admitting: Emergency Medicine

## 2014-10-27 DIAGNOSIS — Z8742 Personal history of other diseases of the female genital tract: Secondary | ICD-10-CM | POA: Diagnosis not present

## 2014-10-27 DIAGNOSIS — F319 Bipolar disorder, unspecified: Secondary | ICD-10-CM | POA: Diagnosis not present

## 2014-10-27 DIAGNOSIS — Z3202 Encounter for pregnancy test, result negative: Secondary | ICD-10-CM | POA: Diagnosis not present

## 2014-10-27 DIAGNOSIS — R102 Pelvic and perineal pain: Secondary | ICD-10-CM | POA: Diagnosis not present

## 2014-10-27 DIAGNOSIS — Z9089 Acquired absence of other organs: Secondary | ICD-10-CM | POA: Diagnosis not present

## 2014-10-27 DIAGNOSIS — Z880 Allergy status to penicillin: Secondary | ICD-10-CM | POA: Diagnosis not present

## 2014-10-27 DIAGNOSIS — Z720 Tobacco use: Secondary | ICD-10-CM | POA: Insufficient documentation

## 2014-10-27 DIAGNOSIS — J45909 Unspecified asthma, uncomplicated: Secondary | ICD-10-CM | POA: Insufficient documentation

## 2014-10-27 DIAGNOSIS — M549 Dorsalgia, unspecified: Secondary | ICD-10-CM | POA: Diagnosis not present

## 2014-10-27 DIAGNOSIS — Z791 Long term (current) use of non-steroidal anti-inflammatories (NSAID): Secondary | ICD-10-CM | POA: Insufficient documentation

## 2014-10-27 DIAGNOSIS — Z79899 Other long term (current) drug therapy: Secondary | ICD-10-CM | POA: Diagnosis not present

## 2014-10-27 LAB — URINE MICROSCOPIC-ADD ON

## 2014-10-27 LAB — URINALYSIS, ROUTINE W REFLEX MICROSCOPIC
BILIRUBIN URINE: NEGATIVE
GLUCOSE, UA: NEGATIVE mg/dL
Ketones, ur: NEGATIVE mg/dL
Leukocytes, UA: NEGATIVE
Nitrite: NEGATIVE
Protein, ur: NEGATIVE mg/dL
SPECIFIC GRAVITY, URINE: 1.025 (ref 1.005–1.030)
UROBILINOGEN UA: 0.2 mg/dL (ref 0.0–1.0)
pH: 6 (ref 5.0–8.0)

## 2014-10-27 LAB — I-STAT BETA HCG BLOOD, ED (MC, WL, AP ONLY): I-stat hCG, quantitative: <5 m[IU]/mL (ref <5)

## 2014-10-27 LAB — I-STAT CHEM 8, ED
BUN: 7 mg/dL (ref 6–20)
CALCIUM ION: 1.15 mmol/L (ref 1.12–1.23)
Chloride: 105 mmol/L (ref 101–111)
Creatinine, Ser: 0.8 mg/dL (ref 0.44–1.00)
Glucose, Bld: 93 mg/dL (ref 65–99)
HEMATOCRIT: 37 % (ref 36.0–46.0)
Hemoglobin: 12.6 g/dL (ref 12.0–15.0)
POTASSIUM: 3.1 mmol/L — AB (ref 3.5–5.1)
Sodium: 144 mmol/L (ref 135–145)
TCO2: 20 mmol/L (ref 0–100)

## 2014-10-27 MED ORDER — HYDROCODONE-ACETAMINOPHEN 5-325 MG PO TABS: ORAL_TABLET | ORAL | Status: DC

## 2014-10-27 MED ORDER — MORPHINE SULFATE 4 MG/ML IJ SOLN
2.0000 mg | Freq: Once | INTRAMUSCULAR | Status: AC
  Administered 2014-10-27: 2 mg via INTRAMUSCULAR
  Filled 2014-10-27: qty 1

## 2014-10-27 MED ORDER — POTASSIUM CHLORIDE CRYS ER 20 MEQ PO TBCR
40.0000 meq | EXTENDED_RELEASE_TABLET | Freq: Once | ORAL | Status: AC
  Administered 2014-10-27: 40 meq via ORAL
  Filled 2014-10-27: qty 2

## 2014-10-27 NOTE — ED Provider Notes (Signed)
CSN: [id number]     Arrival date & time 10/27/14  1542 History   First MD Initiated Contact with Patient 10/27/14 1546     Chief Complaint  Patient presents with  . Pelvic Pain      HPI Pt was seen at 1555. Per pt, c/o sudden onset and persistence of constant pelvic pain for the past 4 days. States the pain is located in her right pelvis, "sharp," with occasional radiation into her back. States she currently has her menses. Pt was evaluated in the ED 3 days ago for same, dx likely ovarian cyst vs dysmenorrhea. Pt states she has been taking the naprosyn and norco without improvement of her pain. Denies vaginal discharge, no dysuria/hematuria, no flank pain, no N/V/D, no fevers, no rash.    Past Medical History  Diagnosis Date  . Asthma   . Bipolar 1 disorder    Past Surgical History  Procedure Laterality Date  . Cholecystectomy    . Myringotomy     Family History  Problem Relation Age of Onset  . Hypertension Mother   . Diabetes Father   . Hypertension Father   . Cancer Father   . Diabetes Other    History  Substance Use Topics  . Smoking status: Current Every Day Smoker -- 0.25 packs/day for 8 years    Types: Cigarettes  . Smokeless tobacco: Never Used  . Alcohol Use: No   OB History    Gravida Para Term Preterm AB TAB SAB Ectopic Multiple Living   Review of Systems ROS: Statement: All systems negative except as marked or noted in the HPI; Constitutional: Negative for fever and chills. ; ; Eyes: Negative for eye pain, redness and discharge. ; ; ENMT: Negative for ear pain, hoarseness, nasal congestion, sinus pressure and sore throat. ; ; Cardiovascular: Negative for chest pain, palpitations, diaphoresis, dyspnea and peripheral edema. ; ; Respiratory: Negative for cough, wheezing and stridor. ; ; Gastrointestinal: Negative for nausea, vomiting, diarrhea, abdominal pain, blood in stool, hematemesis, jaundice and rectal bleeding. . ; ; Genitourinary:  Negative for dysuria, flank pain and hematuria. ; ; GYN:  +pelvic pain, +menses. No vaginal discharge, no vulvar pain. ;; Musculoskeletal: Negative for back pain and neck pain. Negative for swelling and trauma.; ; Skin: Negative for pruritus, rash, abrasions, blisters, bruising and skin lesion.; ; Neuro: Negative for headache, lightheadedness and neck stiffness. Negative for weakness, altered level of consciousness , altered mental status, extremity weakness, paresthesias, involuntary movement, seizure and syncope.      Allergies  Yellow jacket venom; Amoxicillin; Celexa; Ketorolac tromethamine; Penicillins; Tramadol; and Vistaril  Home Medications   Prior to Admission medications   Medication Sig Start Date End Date Taking? Authorizing Provider  acetaminophen (TYLENOL) 500 MG tablet Take 1,000 mg by mouth every 6 (six) hours as needed for moderate pain.   Yes Historical Provider, MD  albuterol (VENTOLIN HFA) 108 (90 BASE) MCG/ACT inhaler Inhale 2 puffs into the lungs every 4 (four) hours as needed. For shortness of breath   Yes Historical Provider, MD  buPROPion (WELLBUTRIN SR) 200 MG 12 hr tablet Take 200 mg by mouth 2 (two) times daily.   Yes Historical Provider, MD  flintstones complete (FLINTSTONES) 60 MG chewable tablet Chew 1 tablet by mouth daily.   Yes Historical Provider, MD  Fluticasone-Salmeterol (ADVAIR) 100-50 MCG/DOSE AEPB Inhale 1 puff into the lungs every 12 (twelve) hours.  Yes Historical Provider, MD  HYDROcodone-acetaminophen (NORCO/VICODIN) 5-325 MG per tablet Take one-two tabs po q 4-6 hrs prn pain Patient taking differently: Take 1-2 tablets by mouth every 4 (four) hours as needed for moderate pain or severe pain.  10/24/14  Yes Tammy Triplett, PA-C  ibuprofen (ADVIL,MOTRIN) 200 MG tablet Take 400 mg by mouth every 6 (six) hours as needed for mild pain or moderate pain.    Yes Historical Provider, MD  montelukast (SINGULAIR) 10 MG tablet Take 10 mg by mouth every morning.     Yes Historical Provider, MD  naproxen (NAPROSYN) 500 MG tablet Take 1 tablet (500 mg total) by mouth 2 (two) times daily with a meal. 10/24/14  Yes Tammy Triplett, PA-C  promethazine (PHENERGAN) 25 MG tablet Take 1 tablet (25 mg total) by mouth every 6 (six) hours as needed. Patient taking differently: Take 25 mg by mouth every 6 (six) hours as needed for nausea or vomiting.  10/22/14  Yes Donnetta Hutching, MD   BP 110/66 mmHg  Pulse 98  Temp(Src) 98.1 F (36.7 C) (Oral)  Resp 16  Ht  (1.549 m)  Wt 149 lb (67.586 kg)  BMI 28.17 kg/m2  SpO2 97%  LMP 10/18/2014 Physical Exam  1605: Physical examination:  Nursing notes reviewed; Vital signs and O2 SAT reviewed;  Constitutional: Well developed, Well nourished, Well hydrated, In no acute distress; Head:  Normocephalic, atraumatic; Eyes: EOMI, PERRL, No scleral icterus; ENMT: Mouth and pharynx normal, Mucous membranes moist; Neck: Supple, Full range of motion, No lymphadenopathy; Cardiovascular: Regular rate and rhythm, No murmur, rub, or gallop; Respiratory: Breath sounds clear & equal bilaterally, No rales, rhonchi, wheezes.  Speaking full sentences with ease, Normal respiratory effort/excursion; Chest: Nontender, Movement normal; Abdomen: Soft, +right lower pelvic tenderness to palp. No rebound or guarding. Nondistended, Normal bowel sounds; Genitourinary: No CVA tenderness; Spine:  No midline CS, TS, LS tenderness.;; Extremities: Pulses normal, No tenderness, No edema, No calf edema or asymmetry.; Neuro: AA&Ox3, Major CN grossly intact.  Speech clear. No gross focal motor or sensory deficits in extremities. Climbs on and off stretcher easily by herself. Gait steady.; Skin: Color normal, Warm, Dry.   ED Course  Procedures   1600:  Pt seen in the ED 10/22/14 and 10/24/14 for abd pain with reassuring labs and pelvic exam. Wet prep and GC/chlam from 3 days ago both negative. Will not repeat. Will obtain US pelvis today.  1700:  US pelvis reassuring.  Will obtain CT A/P.   1745:  Workup reassuring. Tx symptomatically at this time. Dx and testing d/w pt.  Questions answered.  Verb understanding, agreeable to d/c home with outpt f/u.    MDM  MDM Reviewed: previous chart, nursing note and vitals Reviewed previous: labs Interpretation: labs and ultrasound     Results for orders placed or performed during the hospital encounter of 10/27/14  Urinalysis, Routine w reflex microscopic (not at [facility])  Result Value Ref Range   Color, Urine YELLOW YELLOW   APPearance CLEAR CLEAR   Specific Gravity, Urine 1.025 1.005 - 1.030   pH 6.0 5.0 - 8.0   Glucose, UA NEGATIVE NEGATIVE mg/dL   Hgb urine dipstick TRACE (A) NEGATIVE   Bilirubin Urine NEGATIVE NEGATIVE   Ketones, ur NEGATIVE NEGATIVE mg/dL   Protein, ur NEGATIVE NEGATIVE mg/dL   Urobilinogen, UA 0.2 0.0 - 1.0 mg/dL   Nitrite NEGATIVE NEGATIVE   Leukocytes, UA NEGATIVE NEGATIVE  Urine microscopic-add on  Result Value Ref Range   Squamous  Epithelial / LPF RARE RARE   WBC, UA 0-2 <3 WBC/hpf   RBC / HPF 0-2 <3 RBC/hpf   Bacteria, UA MANY (A) RARE  I-stat Chem 8, ED  Result Value Ref Range   Sodium 144 135 - 145 mmol/L   Potassium 3.1 (L) 3.5 - 5.1 mmol/L   Chloride 105 101 - 111 mmol/L   BUN 7 6 - 20 mg/dL   Creatinine, Ser 1.24 0.44 - 1.00 mg/dL   Glucose, Bld 93 65 - 99 mg/dL   Calcium, Ion 5.80 9.98 - 1.23 mmol/L   TCO2 20 0 - 100 mmol/L   Hemoglobin 12.6 12.0 - 15.0 g/dL   HCT 33.8 25.0 - 53.9 %  I-Stat beta hCG blood, ED  Result Value Ref Range   I-stat hCG, quantitative <5.0 <5 mIU/mL   Comment 3            US Transvaginal Non-ob 10/27/2014   CLINICAL DATA:  Right-sided pelvic pain for 1 week  EXAM: TRANSABDOMINAL AND TRANSVAGINAL ULTRASOUND OF PELVIS  DOPPLER ULTRASOUND OF OVARIES  TECHNIQUE: Study was performed transabdominally to optimize pelvic field of view evaluation and transvaginally to optimize internal visceral architecture evaluation.  Color and duplex  Doppler ultrasound was utilized to evaluate blood flow to the ovaries.  COMPARISON:  None.  FINDINGS: Uterus  Measurements: 8.5 x 3.8 x 4.9 cm. No fibroids or other mass visualized.  Endometrium  Thickness: 2 mm.  No focal abnormality visualized.  Right ovary  Measurements: 3.6 x 2.5 x 2.0 cm. Normal appearance/no adnexal mass.  Left ovary  Measurements: Left ovary could not be visualized by either transabdominal or transvaginal technique. No left-sided pelvic mass is appreciable.  Pulsed Doppler evaluation of the right over demonstrates normal low-resistance arterial and venous waveforms. The peak systolic velocity in the right ovary is 7 cm/sec. Left ovary could not be visualized, and therefore Doppler evaluation could not be performed valve the left ovary.  Other findings  No free fluid.  IMPRESSION: Left ovary could not be visualized due to overlying gas by either transabdominal or transvaginal technique. Assessment for potential left ovarian torsion could not be made at this time given this circumstance. No left-sided pelvic masses appreciable.  The right ovary appears normal sonographically. There is no evidence of right ovarian torsion.  Uterus and endometrium appear normal.   Electronically Signed   By: Bretta Bang III M.D.   On: 10/27/2014 17:00    Ct Renal Stone Study 10/27/2014   CLINICAL DATA:  RIGHT-side abdominal pain and back pain, pelvic pain in a female, worsened pain, similar symptoms last week, smoker  EXAM: CT ABDOMEN AND PELVIS WITHOUT CONTRAST  TECHNIQUE: Multidetector CT imaging of the abdomen and pelvis was performed following the standard protocol without IV contrast. Sagittal and coronal MPR images reconstructed from axial data set. Oral contrast was not administered.  COMPARISON:  01/10/2012  FINDINGS: Lung bases clear.  Borderline splenic enlargement,, spleen measuring 16.6 x 12.5 x 4.4 cm (volume 478 ml).  Gallbladder surgically absent.  Within limits of a nonenhanced exam, no  other focal abnormalities of the liver, spleen, pancreas, kidneys, or adrenal glands.  Specifically no urinary tract calcification, hydronephrosis or ureteral dilatation.  Tiny umbilical hernia containing fat.  Normal appendix.  Unremarkable uterus, adnexa ureters in decompressed bladder.  Stomach and bowel loops otherwise grossly unremarkable.  Scattered normal size retroperitoneal mesenteric nodes without adenopathy.  No mass, adenopathy, free fluid, free air, inflammatory process or acute osseous findings.  IMPRESSION:  Borderline splenic enlargement.  Tiny umbilical hernia containing fat.  Post cholecystectomy.  Otherwise negative exam.   Electronically Signed   By: Ulyses Southward M.D.   On: 10/27/2014 17:27      Samuel Jester, DO 10/29/14 1445

## 2014-10-27 NOTE — ED Notes (Signed)
MD at bedside. 

## 2014-10-27 NOTE — ED Notes (Signed)
Pt states she is having right side flank pain. Denies  Pain with urination. No change in bowel habits.

## 2014-10-27 NOTE — ED Notes (Signed)
Right sided abdominal pain.  Seen here with same this past week.  Pain is worse.

## 2014-10-27 NOTE — Discharge Instructions (Signed)
°Emergency Department Resource Guide °1) Find a Doctor and Pay Out of Pocket °Although you won't have to find out who is covered by your insurance plan, it is a good idea to ask around and get recommendations. You will then need to call the office and see if the doctor you have chosen will accept you as a new patient and what types of options they offer for patients who are self-pay. Some doctors offer discounts or will set up payment plans for their patients who do not have insurance, but you will need to ask so you aren't surprised when you get to your appointment. ° °2) Contact Your Local Health Department °Not all health departments have doctors that can see patients for sick visits, but many do, so it is worth a call to see if yours does. If you don't know where your local health department is, you can check in your phone book. The CDC also has a tool to help you locate your state's health department, and many state websites also have listings of all of their local health departments. ° °3) Find a Walk-in Clinic °If your illness is not likely to be very severe or complicated, you may want to try a walk in clinic. These are popping up all over the country in pharmacies, drugstores, and shopping centers. They're usually staffed by nurse practitioners or physician assistants that have been trained to treat common illnesses and complaints. They're usually fairly quick and inexpensive. However, if you have serious medical issues or chronic medical problems, these are probably not your best option. ° °No Primary Care Doctor: °- Call [facility] at  832-8000 - they can help you locate a primary care doctor that  accepts your insurance, provides certain services, etc. °- Physician Referral Service- 1-800-533-3463 ° °Chronic Pain Problems: °Organization         Address  Phone   Notes  °[facility]  (336) 297-2271 Patients need to be referred by their primary care doctor.  ° °Medication  Assistance: °Organization         Address  Phone   Notes  °[place] [facility] [address] °[place], [place] [address] (336) 641-8030 --Must be a resident of [place] °-- Must have NO insurance coverage whatsoever (no Medicaid/ Medicare, etc.) °-- The pt. MUST have a primary care doctor that directs their care regularly and follows them in the community °  °[facility]  (866) 331-1348   °[employer]  (888) 892-1162   ° °Agencies that provide inexpensive medical care: °Organization         Address  Phone   Notes  °[facility]  (336) 832-8035   °[facility]    (336) 832-7272   °[facility] [address] °[place], [place] [address] (336) 832-4777   °[facility] [address], °[place] (336) 271-4999   °[facility]    (336) 373-0678   °[facility]    (336) 272-1050   °[facility] ° [address], [place] Phone:  (336) 832-4444, Fax:  (336) 832-4440 Hours of Operation:  9 am - 6 pm, M-F.  Also accepts Medicaid/Medicare and self-pay.  °[facility] ° [address], [place] Phone: (336) 832-3150, Fax: (336) 832-3151. Hours of Operation:  8:30 am - 5:30 pm, M-F.  Also accepts Medicaid and self-pay.  °[facility] [place] [address], [place] Phone: (336) 878-6027   °[facility] [address], [place], [place] (336)723-1848, Ext. 123 Mondays & Thursdays: 7-9 AM.  First 15 patients are seen on a first come, first serve basis. °  ° °Medicaid-accepting [place] Providers: ° °Organization         Address  Phone   Notes  °[facility] [address], [place] (336) 641-2100 Also accepts self-pay patients.  °[facility] [address], [place] ° (336) 856-9996   °[facility] [address], [place]  (336) 288-8857   °[facility] [address] [place] [address], [place] (336) 299-7000   °Veita Bland [address], [place]  ° (336) 373-1557 Only accepts [place] Access Medicaid patients after they have their name applied to their card.  ° °Self-Pay (no insurance) in [place]: ° °Organization         Address  Phone   Notes  °Sickle Cell Patients, [facility] [address], [place] (336) 832-1970   °[facility] [address], [place] (336) 832-4400   °[facility] ° [address], [place] (336) 992-4800   °[facility]/Dr. Osei-Bonsu ° [address] [place] [address], [place] or [address], [place] (336) 841-8500 Phone number for both [place] and [place] locations is the same.  °[facility] [address], [place] (336) 299-0000   °[facility] [address] [place] [address], [place] or [address] (336) 852-7530 °(336) 878-2260   °[facility] [address], [place] (336) 350-1642, phone; (336) 294-5005, fax Sees patients 1st and 3rd Saturday of every month.  Must not qualify for public or private insurance (i.e. Medicaid, Medicare, [place] Health Choice, Veterans' Benefits) • Household income should be no more than 200% of the poverty level •The clinic cannot treat you if you are pregnant or think you are pregnant • Sexually transmitted diseases are not treated at the clinic.  ° ° °Dental Care: °Organization         Address  Phone  Notes  °[place] [facility] [address], [place] (336) 641-6152 Accepts children up to age 21 who are enrolled in Medicaid or [place] Health Choice; pregnant women with a Medicaid card; and children who have applied for Medicaid or [place] Health Choice, but were declined, whose parents can pay a reduced fee at time of service.  °[place]  [facility] [place]  [address], [place] (336) 641-7733 Accepts children up to age 21 who are enrolled in Medicaid or [place] Health Choice; pregnant women with a Medicaid card; and children who have applied for Medicaid or [place] Health Choice, but were declined, whose parents can pay a reduced fee at time of service.  °[facility] ° [address], [place] (336) 641-4533 Patients are seen by appointment only. Walk-ins are not accepted. [facility] will see patients 18 years of age and older. °Monday - Tuesday (8am-5pm) °Most Wednesdays (8:30-5pm) °$30 per visit, cash only  °[facility] ° [address], [place] (336) 641-4533 Patients are seen by appointment only. Walk-ins are not accepted. [facility] will see patients 18 years of age and older. °One   Wednesday Evening (Monthly: Volunteer Based).  $30 per visit, cash only  °[facility]  (919) 537-3737 for adults; Children under age 4, call Graduate Pediatric Dentistry at (919) [phone]. Children aged 4-14, please call (919) [phone] to request a pediatric application. ° Dental services are provided in all areas of dental care including fillings, crowns and bridges, complete and partial dentures, implants, gum treatment, root canals, and extractions. Preventive care is also provided. Treatment is provided to both adults and children. °Patients are selected via a lottery and there is often a waiting list. °  °[facility] [address], °[place] ° (336) 763-8833 [email or web address] °  °[facility] [address], [place], [place] (336)723-1848, Ext. 123 Second and Fourth Thursday of each month, opens at 6:30 AM; Clinic ends at 9 AM.  Patients are seen on a first-come first-served basis, and a limited number are seen during each clinic.  ° °[facility] ° [address], [place], [place] (336) 723-7904    Eligibility Requirements °You must have lived in [place], [place], or [place] counties for at least the last three months. °  You cannot be eligible for state or federal sponsored healthcare insurance, including Veterans Administration, Medicaid, or Medicare. °  You generally cannot be eligible for healthcare insurance through your employer.  °  How to apply: °Eligibility screenings are held every Tuesday and Wednesday afternoon from 1:00 pm until 4:00 pm. You do not need an appointment for the interview!  °[facility] [address], [place], [place] 336-631-2330   °[facility]  336-342-8273   °[facility]  336-703-3100   °[facility]  336-570-6415   ° °Behavioral Health Resources in the Community: °Intensive Outpatient Programs °Organization         Address  Phone  Notes  °[facility] [address], [place], [place] 336-878-6098   °[facility] [address], [place], [place] 336-832-9800   °[facility] [address], [place], [place] ° 336-882-2125   °[facility] [address],  °[place], [place] 1-800-853-5163 or 336-641-4981   °Substance Abuse Resources °Organization         Address  Phone  Notes  °[facility]  336-882-2125   °[facility]  336-784-9470   °[facility]  336-285-9073   °[facility]  336-845-3988   °Residential & Outpatient Substance Abuse Program  1-800-659-3381   °Psychological Services °Organization         Address  Phone  Notes  °[facility]  336- 832-9600   °[facility]  336- 378-7881   °[facility] [address], [place] 1-800-853-5163 or 336-641-4981   ° °Mobile Crisis Teams °Organization         Address  Phone  Notes  °[facility], Mobile Crisis Care Unit  1-877-626-1772   °Assertive °[facility] ° [address]  [place], [place] 336-834-9664   °Sharon DeEsch [address] °[place] 336-554-5454   ° °Self-Help/Support Groups °Organization         Address  Phone             Notes  °[facility]  - variety of support groups  336- 373-1402 Call for more information  °Narcotics Anonymous (NA), [facility] [address], °[place]  2 meetings at this location  ° °  Residential Treatment Programs Organization         Address  Phone  Notes  [facility] [address],    [place]  2-458-099-8338   [facility]  [address] [place] [address], [place], [place] 767-341-9379   [facility] [address] [place], [place] 024-097-3532 Admissions: 8am-3pm M-F  [facility] [address]    [place], [place] [phone]   [facility] [address] [place], [place], [place] [phone]   [facility] [address],  [place], [place] [phone]   [facility] Intensive Outpatient [address], [place], [place] 144-818-5631   [facility] ([facility] [address]  [place], [place] 4-970-263-7858 or 980-704-0834   [facility] ([facility] [address] [place], [place] 786-767-2094 Accepts Medicaid  [facility] [address],  [place] 7-096-283-6629 Substance Abuse/Addiction Treatment   [facility] Organization         Address  Phone  Notes  [facility]  [phone]   Angie Fava, PhD [address] [place] [place], [place]   (218)538-4438 or 978-677-3871   [facility]   [address] [place], [place] 606-812-1999   [facility] [address] [place], [place] 706-073-8566 Insurance/Medicaid/sponsorship through [facility] [address]                                    [place], [place] (612)[phone] Therapy/tele-psych/case    [facility] [address], [place] [phone]    Dr. Lolly Mustache  ([phone]   [facility]  [employer] [facility] Dept. 1) [address] [place] 2) [address] [place] 3)  [address], [place] [phone]  [phone]   [facility] Child Abuse Hotline 210-106-5462 or (667) 145-8569 (After Hours)      Take the prescription as directed. Continue to take the naprosyn prescription from your previous ED visit 3 days ago. Apply moist heat to the area(s) of discomfort, for 15 minutes at a time, several times per day for the next few days.  Do not fall asleep on a heating pack.  Call your regular medical doctor tomorrow to schedule a follow up appointment in the next 2 days. Call the OB/GYN doctor tomorrow to schedule a follow up appointment within the next week.  Return to the Emergency Department immediately if worsening.

## 2014-11-03 ENCOUNTER — Emergency Department (HOSPITAL_COMMUNITY)
Admission: EM | Admit: 2014-11-03 | Discharge: 2014-11-03 | Disposition: A | Discharge status: Home / Self Care | Payer: MEDICAID | Attending: Emergency Medicine | Admitting: Emergency Medicine

## 2014-11-03 ENCOUNTER — Encounter (HOSPITAL_COMMUNITY): Payer: Self-pay | Admitting: Emergency Medicine

## 2014-11-03 ENCOUNTER — Emergency Department (HOSPITAL_COMMUNITY): Payer: MEDICAID

## 2014-11-03 DIAGNOSIS — Z720 Tobacco use: Secondary | ICD-10-CM | POA: Diagnosis not present

## 2014-11-03 DIAGNOSIS — Z880 Allergy status to penicillin: Secondary | ICD-10-CM | POA: Diagnosis not present

## 2014-11-03 DIAGNOSIS — Z79899 Other long term (current) drug therapy: Secondary | ICD-10-CM | POA: Diagnosis not present

## 2014-11-03 DIAGNOSIS — J45909 Unspecified asthma, uncomplicated: Secondary | ICD-10-CM | POA: Diagnosis not present

## 2014-11-03 DIAGNOSIS — F319 Bipolar disorder, unspecified: Secondary | ICD-10-CM | POA: Diagnosis not present

## 2014-11-03 DIAGNOSIS — F419 Anxiety disorder, unspecified: Secondary | ICD-10-CM | POA: Diagnosis not present

## 2014-11-03 DIAGNOSIS — R42 Dizziness and giddiness: Secondary | ICD-10-CM | POA: Diagnosis present

## 2014-11-03 LAB — I-STAT CHEM 8, ED
BUN: 8 mg/dL (ref 6–20)
CALCIUM ION: 1.12 mmol/L (ref 1.12–1.23)
CREATININE: 0.6 mg/dL (ref 0.44–1.00)
Chloride: 104 mmol/L (ref 101–111)
GLUCOSE: 110 mg/dL — AB (ref 65–99)
HEMATOCRIT: 39 % (ref 36.0–46.0)
HEMOGLOBIN: 13.3 g/dL (ref 12.0–15.0)
Potassium: 3.5 mmol/L (ref 3.5–5.1)
Sodium: 141 mmol/L (ref 135–145)
TCO2: 21 mmol/L (ref 0–100)

## 2014-11-03 MED ORDER — LORAZEPAM 1 MG PO TABS
1.0000 mg | ORAL_TABLET | Freq: Once | ORAL | Status: AC
  Administered 2014-11-03: 1 mg via ORAL
  Filled 2014-11-03: qty 1

## 2014-11-03 NOTE — Discharge Instructions (Signed)
Panic Attacks °Panic attacks are sudden, short feelings of great fear or discomfort. You may have them for no reason when you are relaxed, when you are uneasy (anxious), or when you are sleeping.  °HOME CARE °· Take all your medicines as told. °· Check with your doctor before starting new medicines. °· Keep all doctor visits. °GET HELP IF: °· You are not able to take your medicines as told. °· Your symptoms do not get better. °· Your symptoms get worse. °GET HELP RIGHT AWAY IF: °· Your attacks seem different than your normal attacks. °· You have thoughts about hurting yourself or others. °· You take panic attack medicine and you have a side effect. °MAKE SURE YOU: °· Understand these instructions. °· Will watch your condition. °· Will get help right away if you are not doing well or get worse. °Document Released: 06/01/2010 Document Revised: 02/17/2013 Document Reviewed: 12/11/2012 °ExitCare® Patient Information ©2015 [employer]. This information is not intended to replace advice given to you by your health care provider. Make sure you discuss any questions you have with your health care provider. ° °
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
## 2016-01-04 ENCOUNTER — Encounter (HOSPITAL_COMMUNITY): Payer: Self-pay | Admitting: *Deleted

## 2016-01-04 ENCOUNTER — Emergency Department (HOSPITAL_COMMUNITY)
Admission: EM | Admit: 2016-01-04 | Discharge: 2016-01-04 | Disposition: A | Discharge status: Home / Self Care | Payer: Medicaid Other | Attending: Emergency Medicine | Admitting: Emergency Medicine

## 2016-01-04 DIAGNOSIS — N644 Mastodynia: Secondary | ICD-10-CM | POA: Insufficient documentation

## 2016-01-04 DIAGNOSIS — Z79899 Other long term (current) drug therapy: Secondary | ICD-10-CM | POA: Insufficient documentation

## 2016-01-04 DIAGNOSIS — Z87891 Personal history of nicotine dependence: Secondary | ICD-10-CM | POA: Insufficient documentation

## 2016-01-04 DIAGNOSIS — Z791 Long term (current) use of non-steroidal anti-inflammatories (NSAID): Secondary | ICD-10-CM | POA: Insufficient documentation

## 2016-01-04 DIAGNOSIS — J45909 Unspecified asthma, uncomplicated: Secondary | ICD-10-CM | POA: Insufficient documentation

## 2016-01-04 MED ORDER — IBUPROFEN 800 MG PO TABS
800.0000 mg | ORAL_TABLET | Freq: Once | ORAL | Status: AC
  Administered 2016-01-04: 800 mg via ORAL
  Filled 2016-01-04: qty 1

## 2016-01-04 MED ORDER — HYDROCODONE-ACETAMINOPHEN 5-325 MG PO TABS
1.0000 | ORAL_TABLET | Freq: Once | ORAL | Status: AC
Start: 2016-01-04 — End: 2016-01-04
  Administered 2016-01-04: 1 via ORAL
  Filled 2016-01-04: qty 1

## 2016-01-04 NOTE — Discharge Instructions (Signed)
Ibuprofen if needed for pain.  Avoid caffeine. Follow-up with the health department for recheck if needed

## 2016-01-04 NOTE — ED Triage Notes (Signed)
Pt comes in with right breast pain. Pt states feels a knot that has been there a while but had increased pain in this area today. Denies any redness. Pt had nipple discharge one week ago.

## 2016-01-06 NOTE — ED Provider Notes (Signed)
[facility] DEPT Provider Note   CSN: [id number] Arrival date & time: 01/04/16  1443     History   Chief Complaint Chief Complaint  Patient presents with  . Breast Pain    HPI Brittany Payne is a 25 y.o. female.  HPI   Brittany Payne is a 25 y.o. female who presents to the Emergency Department complaining of right breast pain for several days.  She reports feeling a "knot" to her right chest and developed tenderness at the site several days ago.  She states that she is mid way of her menstrual cycle when she noticed her symptoms.  She states that she is concerned because her mother has breast cancer.  She denies skin changes, nipple inversion or discharge.  Pain improves with support.     Past Medical History:  Diagnosis Date  . Asthma   . Bipolar 1 disorder (HCC)     There are no active problems to display for this patient.   Past Surgical History:  Procedure Laterality Date  . CHOLECYSTECTOMY    . MYRINGOTOMY      OB History    Gravida Para Term Preterm AB Living   2 2 2     2    SAB TAB Ectopic Multiple Live Births                   Home Medications    Prior to Admission medications   Medication Sig Start Date End Date Taking? Authorizing Provider  acetaminophen (TYLENOL) 500 MG tablet Take 1,000 mg by mouth every 6 (six) hours as needed for moderate pain.   Yes Historical Provider, MD  ibuprofen (ADVIL,MOTRIN) 200 MG tablet Take 400 mg by mouth every 6 (six) hours as needed for mild pain or moderate pain.    Yes Historical Provider, MD  sertraline (ZOLOFT) 100 MG tablet Take 100 mg by mouth daily.   Yes Historical Provider, MD  HYDROcodone-acetaminophen (NORCO/VICODIN) 5-325 MG per tablet 1 or 2 tabs PO q6 hours prn pain Patient not taking: Reported on 01/04/2016 10/27/14   Samuel Jester, DO    Family History Family History  Problem Relation Age of Onset  . Hypertension Mother   . Diabetes Father   . Hypertension Father   . Cancer Father   .  Diabetes Other     Social History Social History  Substance Use Topics  . Smoking status: Former Smoker    Years: 8.00    Types: Cigarettes    Quit date: 12/21/2015  . Smokeless tobacco: Never Used  . Alcohol use No     Allergies   Yellow jacket venom; Amoxicillin; Celexa [citalopram]; Ketorolac tromethamine; Penicillins; Tramadol; and Vistaril [hydroxyzine hcl]   Review of Systems Review of Systems  Constitutional: Negative for appetite change, chills and fever.  Respiratory: Negative for cough, chest tightness and shortness of breath.   Cardiovascular: Negative for chest pain.  Gastrointestinal: Negative for abdominal pain, nausea and vomiting.  Genitourinary: Negative for difficulty urinating, pelvic pain, vaginal bleeding and vaginal discharge.       Right breast pain  Musculoskeletal: Negative for arthralgias.  Skin: Negative for color change, rash and wound.  Neurological: Negative for syncope.     Physical Exam Updated Vital Signs BP 122/77 (BP Location: Left Arm)   Pulse 66   Temp 98.2 F (36.8 C) (Oral)   Resp 16   Ht 5\' 2"  (1.575 m)   Wt 67.1 kg   LMP 12/21/2015   SpO2 100%  BMI 27.07 kg/m   Physical Exam  Constitutional: She is oriented to person, place, and time. She appears well-nourished. No distress.  HENT:  Head: Atraumatic.  Neck: Normal range of motion. Neck supple.  Cardiovascular: Normal rate and regular rhythm.   Pulmonary/Chest: Effort normal and breath sounds normal. No respiratory distress. She exhibits tenderness. Right breast exhibits tenderness. Right breast exhibits no inverted nipple, no mass, no nipple discharge and no skin change.    Focal tenderness of the upper right breast.  No palpable masses appreciated. No erythema, asymmetry, nipple discharge.   Abdominal: Soft. She exhibits no distension. There is no tenderness.  Musculoskeletal: Normal range of motion.  Lymphadenopathy:    She has no cervical adenopathy.    Neurological: She is alert and oriented to person, place, and time.  Skin: Skin is warm. No rash noted. No erythema.  Psychiatric: She has a normal mood and affect.  Nursing note and vitals reviewed.    ED Treatments / Results  Labs (all labs ordered are listed, but only abnormal results are displayed) Labs Reviewed - No data to display  EKG  EKG Interpretation None       Radiology No results found.  Procedures Procedures (including critical care time)  Medications Ordered in ED Medications  ibuprofen (ADVIL,MOTRIN) tablet 800 mg (800 mg Oral Given 01/04/16 1731)  HYDROcodone-acetaminophen (NORCO/VICODIN) 5-325 MG per tablet 1 tablet (1 tablet Oral Given 01/04/16 1731)     Initial Impression / Assessment and Plan / ED Course  I have reviewed the triage vital signs and the nursing notes.  Pertinent labs & imaging results that were available during my care of the patient were reviewed by me and considered in my medical decision making (see chart for details).  Clinical Course    1635  Consulted Dr. Emelda FearFerguson and case discussed.    Pt is well appearing.  Vitals stable. No clinically concerning sx's on exam.  Pain is likely related to hormonal changes, discussed this with patient.  She agrees to symptomatic treatment and to f/u with health dept after one full menstrual cycle if sx's haven't resolved.    Final Clinical Impressions(s) / ED Diagnoses   Final diagnoses:  Breast pain    New Prescriptions Discharge Medication List as of 01/04/2016  5:27 PM       Barrett Holthaus Rowe Robertriplett, PA-C 01/06/16 0913    Bethann BerkshireJoseph Zammit, MD 01/06/16 1227

## 2016-01-29 ENCOUNTER — Emergency Department (HOSPITAL_COMMUNITY): Payer: No Typology Code available for payment source

## 2016-01-29 ENCOUNTER — Encounter (HOSPITAL_COMMUNITY): Payer: Self-pay | Admitting: Emergency Medicine

## 2016-01-29 ENCOUNTER — Emergency Department (HOSPITAL_COMMUNITY)
Admission: EM | Admit: 2016-01-29 | Discharge: 2016-01-30 | Disposition: A | Discharge status: Home / Self Care | Payer: No Typology Code available for payment source | Attending: Emergency Medicine | Admitting: Emergency Medicine

## 2016-01-29 DIAGNOSIS — S20219A Contusion of unspecified front wall of thorax, initial encounter: Secondary | ICD-10-CM | POA: Diagnosis not present

## 2016-01-29 DIAGNOSIS — Y999 Unspecified external cause status: Secondary | ICD-10-CM | POA: Diagnosis not present

## 2016-01-29 DIAGNOSIS — J45998 Other asthma: Secondary | ICD-10-CM | POA: Insufficient documentation

## 2016-01-29 DIAGNOSIS — Z87891 Personal history of nicotine dependence: Secondary | ICD-10-CM | POA: Insufficient documentation

## 2016-01-29 DIAGNOSIS — Z79899 Other long term (current) drug therapy: Secondary | ICD-10-CM | POA: Diagnosis not present

## 2016-01-29 DIAGNOSIS — Z791 Long term (current) use of non-steroidal anti-inflammatories (NSAID): Secondary | ICD-10-CM | POA: Diagnosis not present

## 2016-01-29 DIAGNOSIS — Y92410 Unspecified street and highway as the place of occurrence of the external cause: Secondary | ICD-10-CM | POA: Insufficient documentation

## 2016-01-29 DIAGNOSIS — Y9389 Activity, other specified: Secondary | ICD-10-CM | POA: Diagnosis not present

## 2016-01-29 DIAGNOSIS — S20211A Contusion of right front wall of thorax, initial encounter: Secondary | ICD-10-CM

## 2016-01-29 DIAGNOSIS — Z046 Encounter for general psychiatric examination, requested by authority: Secondary | ICD-10-CM | POA: Diagnosis present

## 2016-01-29 HISTORY — DX: Major depressive disorder, single episode, unspecified: F32.9

## 2016-01-29 HISTORY — DX: Depression, unspecified: F32.A

## 2016-01-29 LAB — CBC WITH DIFFERENTIAL/PLATELET
BASOS PCT: 0 %
Basophils Absolute: 0 10*3/uL (ref 0.0–0.1)
Eosinophils Absolute: 0.1 10*3/uL (ref 0.0–0.7)
Eosinophils Relative: 1 %
HEMATOCRIT: 40.1 % (ref 36.0–46.0)
HEMOGLOBIN: 13.9 g/dL (ref 12.0–15.0)
LYMPHS ABS: 1.2 10*3/uL (ref 0.7–4.0)
LYMPHS PCT: 17 %
MCH: 31.6 pg (ref 26.0–34.0)
MCHC: 34.7 g/dL (ref 30.0–36.0)
MCV: 91.1 fL (ref 78.0–100.0)
MONOS PCT: 6 %
Monocytes Absolute: 0.4 10*3/uL (ref 0.1–1.0)
NEUTROS ABS: 5.3 10*3/uL (ref 1.7–7.7)
NEUTROS PCT: 76 %
Platelets: 122 10*3/uL — ABNORMAL LOW (ref 150–400)
RBC: 4.4 MIL/uL (ref 3.87–5.11)
RDW: 14.5 % (ref 11.5–15.5)
WBC: 6.9 10*3/uL (ref 4.0–10.5)

## 2016-01-29 LAB — COMPREHENSIVE METABOLIC PANEL
ALBUMIN: 4.5 g/dL (ref 3.5–5.0)
ALT: 11 U/L — ABNORMAL LOW (ref 14–54)
ANION GAP: 12 (ref 5–15)
AST: 13 U/L — ABNORMAL LOW (ref 15–41)
Alkaline Phosphatase: 65 U/L (ref 38–126)
BUN: 6 mg/dL (ref 6–20)
CHLORIDE: 106 mmol/L (ref 101–111)
CO2: 22 mmol/L (ref 22–32)
Calcium: 8.9 mg/dL (ref 8.9–10.3)
Creatinine, Ser: 0.56 mg/dL (ref 0.44–1.00)
GFR calc Af Amer: >60 mL/min (ref >60)
GFR calc non Af Amer: >60 mL/min (ref >60)
GLUCOSE: 96 mg/dL (ref 65–99)
POTASSIUM: 3.4 mmol/L — AB (ref 3.5–5.1)
SODIUM: 140 mmol/L (ref 135–145)
TOTAL PROTEIN: 7.9 g/dL (ref 6.5–8.1)
Total Bilirubin: 0.5 mg/dL (ref 0.3–1.2)

## 2016-01-29 LAB — PREGNANCY, URINE: Preg Test, Ur: NEGATIVE

## 2016-01-29 LAB — RAPID URINE DRUG SCREEN, HOSP PERFORMED
Amphetamines: NOT DETECTED
BARBITURATES: NOT DETECTED
BENZODIAZEPINES: POSITIVE — AB
COCAINE: NOT DETECTED
Opiates: POSITIVE — AB
TETRAHYDROCANNABINOL: POSITIVE — AB

## 2016-01-29 LAB — ETHANOL: Alcohol, Ethyl (B): <5 mg/dL (ref <5)
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
## 2016-01-29 NOTE — ED Notes (Signed)
Pt request pain meds- Her sister is at the bedside
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
## 2016-01-29 NOTE — ED Notes (Signed)
Patient transported to X-ray 

## 2016-01-29 NOTE — ED Notes (Signed)
To X-ray
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
## 2016-01-29 NOTE — Discharge Instructions (Signed)
X-ray shows no fractured ribs. Tylenol or ibuprofen for pain. [facility]
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
## 2016-05-28 ENCOUNTER — Emergency Department (HOSPITAL_COMMUNITY)
Admission: EM | Admit: 2016-05-28 | Discharge: 2016-05-28 | Disposition: A | Discharge status: Home / Self Care | Payer: Self-pay | Attending: Emergency Medicine | Admitting: Emergency Medicine

## 2016-05-28 ENCOUNTER — Encounter (HOSPITAL_COMMUNITY): Payer: Self-pay | Admitting: Emergency Medicine

## 2016-05-28 DIAGNOSIS — M545 Low back pain, unspecified: Secondary | ICD-10-CM

## 2016-05-28 DIAGNOSIS — Z87891 Personal history of nicotine dependence: Secondary | ICD-10-CM | POA: Insufficient documentation

## 2016-05-28 DIAGNOSIS — Z791 Long term (current) use of non-steroidal anti-inflammatories (NSAID): Secondary | ICD-10-CM | POA: Insufficient documentation

## 2016-05-28 DIAGNOSIS — R112 Nausea with vomiting, unspecified: Secondary | ICD-10-CM

## 2016-05-28 DIAGNOSIS — O219 Vomiting of pregnancy, unspecified: Secondary | ICD-10-CM | POA: Insufficient documentation

## 2016-05-28 DIAGNOSIS — Z3490 Encounter for supervision of normal pregnancy, unspecified, unspecified trimester: Secondary | ICD-10-CM

## 2016-05-28 DIAGNOSIS — O26891 Other specified pregnancy related conditions, first trimester: Secondary | ICD-10-CM

## 2016-05-28 DIAGNOSIS — Z3A00 Weeks of gestation of pregnancy not specified: Secondary | ICD-10-CM | POA: Insufficient documentation

## 2016-05-28 DIAGNOSIS — Z79899 Other long term (current) drug therapy: Secondary | ICD-10-CM | POA: Insufficient documentation

## 2016-05-28 DIAGNOSIS — J45909 Unspecified asthma, uncomplicated: Secondary | ICD-10-CM | POA: Insufficient documentation

## 2016-05-28 LAB — COMPREHENSIVE METABOLIC PANEL
ALT: 11 U/L — AB (ref 14–54)
ANION GAP: 9 (ref 5–15)
AST: 16 U/L (ref 15–41)
Albumin: 4.6 g/dL (ref 3.5–5.0)
Alkaline Phosphatase: 62 U/L (ref 38–126)
BUN: 6 mg/dL (ref 6–20)
CHLORIDE: 101 mmol/L (ref 101–111)
CO2: 24 mmol/L (ref 22–32)
CREATININE: 0.64 mg/dL (ref 0.44–1.00)
Calcium: 9.1 mg/dL (ref 8.9–10.3)
Glucose, Bld: 119 mg/dL — ABNORMAL HIGH (ref 65–99)
Potassium: 3.1 mmol/L — ABNORMAL LOW (ref 3.5–5.1)
SODIUM: 134 mmol/L — AB (ref 135–145)
Total Bilirubin: 0.7 mg/dL (ref 0.3–1.2)
Total Protein: 7.9 g/dL (ref 6.5–8.1)

## 2016-05-28 LAB — URINALYSIS, ROUTINE W REFLEX MICROSCOPIC
Bilirubin Urine: NEGATIVE
GLUCOSE, UA: NEGATIVE mg/dL
Hgb urine dipstick: NEGATIVE
LEUKOCYTES UA: NEGATIVE
Nitrite: NEGATIVE
PROTEIN: 30 mg/dL — AB
pH: 6 (ref 5.0–8.0)

## 2016-05-28 LAB — PREGNANCY, URINE: PREG TEST UR: POSITIVE — AB

## 2016-05-28 LAB — URINALYSIS, MICROSCOPIC (REFLEX): RBC / HPF: NONE SEEN RBC/hpf (ref 0–5)

## 2016-05-28 LAB — CBC
HCT: 40.3 % (ref 36.0–46.0)
HEMOGLOBIN: 14 g/dL (ref 12.0–15.0)
MCH: 30.6 pg (ref 26.0–34.0)
MCHC: 34.7 g/dL (ref 30.0–36.0)
MCV: 88 fL (ref 78.0–100.0)
PLATELETS: 146 10*3/uL — AB (ref 150–400)
RBC: 4.58 MIL/uL (ref 3.87–5.11)
RDW: 14.3 % (ref 11.5–15.5)
WBC: 8.1 10*3/uL (ref 4.0–10.5)

## 2016-05-28 LAB — LIPASE, BLOOD: Lipase: 17 U/L (ref 11–51)

## 2016-05-28 MED ORDER — ONDANSETRON 4 MG PO TBDP
4.0000 mg | ORAL_TABLET | Freq: Once | ORAL | Status: AC
  Administered 2016-05-28: 4 mg via ORAL

## 2016-05-28 MED ORDER — ONDANSETRON 4 MG PO TBDP: 4.0000 mg | ORAL_TABLET | Freq: Three times a day (TID) | ORAL | 0 refills | Status: DC | PRN

## 2016-05-28 NOTE — ED Provider Notes (Signed)
[facility] DE[facility] Provider Note   CSN: [id number] Arrival date & time: 05/28/16  1650     History   Chief Complaint Chief Complaint  Patient presents with  . Abdominal Pain    HPI Brittany Payne is a 26 y.o. female.  HPI  Patient presents with concern of ongoing low back pain, new nausea, vomiting. Nausea, vomiting started over the past few days, possibly weeks. No substantial abdominal pain, though there is occasional lower abdominal discomfort. No sustained discomfort anywhere except the mid low back. Patient states that she generally well aside from history of bipolar disorder, for which she stopped taking medicine 3 months ago because she felt generally well. However, with new racing thoughts, the patient is scheduled to follow-up with her psychiatrist within a week. Last menstrual period one month ago. No vaginal bleeding, discharge.   Past Medical History:  Diagnosis Date  . Asthma   . Bipolar 1 disorder (HCC)   . Depression     There are no active problems to display for this patient.   Past Surgical History:  Procedure Laterality Date  . CHOLECYSTECTOMY    . MYRINGOTOMY      OB History    Gravida Para Term Preterm AB Living   2 2 2     2    SAB TAB Ectopic Multiple Live Births                   Home Medications    Prior to Admission medications   Medication Sig Start Date End Date Taking? Authorizing Provider  ibuprofen (ADVIL,MOTRIN) 200 MG tablet Take 400 mg by mouth every 6 (six) hours as needed for mild pain or moderate pain.     Historical Provider, MD  sertraline (ZOLOFT) 50 MG tablet Take 50 mg by mouth daily.     Historical Provider, MD    Family History Family History  Problem Relation Age of Onset  . Hypertension Mother   . Diabetes Father   . Hypertension Father   . Cancer Father   . Diabetes Other     Social History Social History  Substance Use Topics  . Smoking status: Former Smoker    Years: 8.00    Types:  Cigarettes    Quit date: 12/21/2015  . Smokeless tobacco: Never Used  . Alcohol use No     Allergies   Yellow jacket venom; Amoxicillin; Celexa [citalopram]; Ketorolac tromethamine; Penicillins; Tramadol; and Vistaril [hydroxyzine hcl]   Review of Systems Review of Systems  Constitutional:       Per HPI, otherwise negative  HENT:       Per HPI, otherwise negative  Respiratory:       Per HPI, otherwise negative  Cardiovascular:       Per HPI, otherwise negative  Gastrointestinal: Positive for nausea and vomiting.  Endocrine:       Negative aside from HPI  Genitourinary:       Neg aside from HPI   Musculoskeletal:       Per HPI, otherwise negative  Skin: Negative.   Neurological: Negative for syncope.     Physical Exam Updated Vital Signs BP 123/92 (BP Location: Left Arm)   Pulse 104   Temp 98.3 F (36.8 C) (Oral)   Resp 18   Ht 5\' 2"  (1.575 m)   Wt 149 lb (67.6 kg)   LMP 04/21/2016 (Exact Date)   SpO2 100%   BMI 27.25 kg/m   Physical Exam  Constitutional: She is oriented  to person, place, and time. She appears well-developed and well-nourished. No distress.  HENT:  Head: Normocephalic and atraumatic.  Eyes: Conjunctivae and EOM are normal.  Cardiovascular: Normal rate and regular rhythm.   Pulmonary/Chest: Effort normal and breath sounds normal. No stridor. No respiratory distress.  Abdominal: She exhibits no distension and no mass. There is no tenderness. There is no rebound and no guarding.  Musculoskeletal: She exhibits no edema.  Neurological: She is alert and oriented to person, place, and time. No cranial nerve deficit.  Skin: Skin is warm and dry.  Psychiatric:  Slightly anxious  Nursing note and vitals reviewed.    ED Treatments / Results  Labs (all labs ordered are listed, but only abnormal results are displayed) Labs Reviewed  COMPREHENSIVE METABOLIC PANEL - Abnormal; Notable for the following:       Result Value   Sodium 134 (*)     Potassium 3.1 (*)    Glucose, Bld 119 (*)    ALT 11 (*)    All other components within normal limits  CBC - Abnormal; Notable for the following:    Platelets 146 (*)    All other components within normal limits  URINALYSIS, ROUTINE W REFLEX MICROSCOPIC - Abnormal; Notable for the following:    APPearance CLOUDY (*)    Specific Gravity, Urine >1.030 (*)    Ketones, ur TRACE (*)    Protein, ur 30 (*)    All other components within normal limits  PREGNANCY, URINE - Abnormal; Notable for the following:    Preg Test, Ur POSITIVE (*)    All other components within normal limits  URINALYSIS, MICROSCOPIC (REFLEX) - Abnormal; Notable for the following:    Bacteria, UA FEW (*)    Squamous Epithelial / LPF 0-5 (*)    All other components within normal limits  LIPASE, BLOOD   Procedures Procedures (including critical care time)  Medications Ordered in ED Medications  ondansetron (ZOFRAN-ODT) disintegrating tablet 4 mg (not administered)     Initial Impression / Assessment and Plan / ED Course  I have reviewed the triage vital signs and the nursing notes.  Pertinent labs & imaging results that were available during my care of the patient were reviewed by me and considered in my medical decision making (see chart for details).  Clinical Course    If you have presents with nausea, vomiting, concern about pregnancy. Patient does have positive pregnancy test per Absent abdominal pain, with hemodynamic stability, there is low suspicion for ectopic pregnancy, or other acute new pathology. Patient provided antiemetics, will follow up with her outpatient providers.    Gerhard Munch, MD 05/28/16 1958

## 2016-05-28 NOTE — ED Triage Notes (Signed)
Pt reports lower abd pain and lower back pain with n/v x2 days.  Denies urinary sxs.  LMP 12/10 and would like pregnancy test.

## 2016-06-06 ENCOUNTER — Other Ambulatory Visit: Payer: Self-pay | Admitting: Obstetrics & Gynecology

## 2016-06-06 DIAGNOSIS — O3680X0 Pregnancy with inconclusive fetal viability, not applicable or unspecified: Secondary | ICD-10-CM

## 2016-06-10 ENCOUNTER — Other Ambulatory Visit: Payer: Self-pay

## 2016-06-10 ENCOUNTER — Encounter: Payer: Self-pay | Admitting: *Deleted

## 2016-10-01 ENCOUNTER — Encounter (HOSPITAL_COMMUNITY): Payer: Self-pay | Admitting: Emergency Medicine

## 2016-10-01 ENCOUNTER — Emergency Department (HOSPITAL_COMMUNITY)
Admission: EM | Admit: 2016-10-01 | Discharge: 2016-10-02 | Disposition: A | Discharge status: Home / Self Care | Payer: Self-pay | Attending: Emergency Medicine | Admitting: Emergency Medicine

## 2016-10-01 DIAGNOSIS — Z3A27 27 weeks gestation of pregnancy: Secondary | ICD-10-CM

## 2016-10-01 DIAGNOSIS — O26892 Other specified pregnancy related conditions, second trimester: Secondary | ICD-10-CM | POA: Insufficient documentation

## 2016-10-01 DIAGNOSIS — R1030 Lower abdominal pain, unspecified: Secondary | ICD-10-CM | POA: Insufficient documentation

## 2016-10-01 DIAGNOSIS — Z3A28 28 weeks gestation of pregnancy: Secondary | ICD-10-CM | POA: Insufficient documentation

## 2016-10-01 DIAGNOSIS — Z79899 Other long term (current) drug therapy: Secondary | ICD-10-CM | POA: Insufficient documentation

## 2016-10-01 DIAGNOSIS — F17210 Nicotine dependence, cigarettes, uncomplicated: Secondary | ICD-10-CM | POA: Insufficient documentation

## 2016-10-01 DIAGNOSIS — J45909 Unspecified asthma, uncomplicated: Secondary | ICD-10-CM | POA: Insufficient documentation

## 2016-10-01 DIAGNOSIS — O99332 Smoking (tobacco) complicating pregnancy, second trimester: Secondary | ICD-10-CM | POA: Insufficient documentation

## 2016-10-01 LAB — CBC
HEMATOCRIT: 31.2 % — AB (ref 36.0–46.0)
Hemoglobin: 11.2 g/dL — ABNORMAL LOW (ref 12.0–15.0)
MCH: 31.9 pg (ref 26.0–34.0)
MCHC: 35.9 g/dL (ref 30.0–36.0)
MCV: 88.9 fL (ref 78.0–100.0)
Platelets: 99 10*3/uL — ABNORMAL LOW (ref 150–400)
RBC: 3.51 MIL/uL — ABNORMAL LOW (ref 3.87–5.11)
RDW: 13.6 % (ref 11.5–15.5)
WBC: 7.9 10*3/uL (ref 4.0–10.5)

## 2016-10-01 LAB — URINALYSIS, ROUTINE W REFLEX MICROSCOPIC
BACTERIA UA: NONE SEEN
Bilirubin Urine: NEGATIVE
Glucose, UA: NEGATIVE mg/dL
HGB URINE DIPSTICK: NEGATIVE
Ketones, ur: NEGATIVE mg/dL
NITRITE: NEGATIVE
Protein, ur: NEGATIVE mg/dL
SPECIFIC GRAVITY, URINE: 1.012 (ref 1.005–1.030)
pH: 6 (ref 5.0–8.0)

## 2016-10-01 LAB — BASIC METABOLIC PANEL
Anion gap: 8 (ref 5–15)
BUN: 5 mg/dL — ABNORMAL LOW (ref 6–20)
CALCIUM: 8.7 mg/dL — AB (ref 8.9–10.3)
CHLORIDE: 104 mmol/L (ref 101–111)
CO2: 21 mmol/L — AB (ref 22–32)
CREATININE: 0.41 mg/dL — AB (ref 0.44–1.00)
GFR calc Af Amer: >60 mL/min (ref >60)
GFR calc non Af Amer: >60 mL/min (ref >60)
GLUCOSE: 95 mg/dL (ref 65–99)
Potassium: 3 mmol/L — ABNORMAL LOW (ref 3.5–5.1)
Sodium: 133 mmol/L — ABNORMAL LOW (ref 135–145)

## 2016-10-01 MED ORDER — HYDROCODONE-ACETAMINOPHEN 5-325 MG PO TABS
1.0000 | ORAL_TABLET | Freq: Once | ORAL | Status: AC
Start: 2016-10-01 — End: 2016-10-01
  Administered 2016-10-01: 1 via ORAL
  Filled 2016-10-01: qty 1

## 2016-10-01 MED ORDER — ONDANSETRON HCL 4 MG/2ML IJ SOLN
4.0000 mg | Freq: Once | INTRAMUSCULAR | Status: AC
  Administered 2016-10-01: 4 mg via INTRAVENOUS
  Filled 2016-10-01: qty 2

## 2016-10-01 MED ORDER — SODIUM CHLORIDE 0.9 % IV BOLUS (SEPSIS)
1000.0000 mL | Freq: Once | INTRAVENOUS | Status: AC
  Administered 2016-10-01: 1000 mL via INTRAVENOUS

## 2016-10-01 NOTE — ED Triage Notes (Signed)
Pt is [redacted] weeks pregnant and having left lower abd pain that is radiating into her vagina

## 2016-10-01 NOTE — ED Provider Notes (Signed)
[facility]-EM[facility]Y DEPT Provider Note   CSN: [id number] Arrival date & time: 10/01/16  2210  By signing my name below, I, Brittany Payne, attest that this documentation has been prepared under the direction and in the presence of physician practitioner, Azalia Bilis, MD. Electronically Signed: Linna Payne, Scribe. 10/01/2016. 11:16 PM.  History   Chief Complaint Chief Complaint  Patient presents with  . Abdominal Pain    with pregnancy   The history is provided by the patient. No language interpreter was used.    HPI Comments: Brittany Payne is a 27-weeks-pregnant 26 y.o. female who presents to the Emergency Department complaining of persistent suprapubic pain beginning earlier today. She states the pain radiates into her right flank. She tried Tylenol 500mg  x2 several hours ago without significant improvement of her pain. Patient has had some intermittent nausea for a few days as well as one episode of vomiting two days ago. She also reports a recent reduced appetite and believes she may have contracted a viral illness from her daughter. No other known ill contacts with similar symptoms. She is currently [redacted] weeks pregnant and felt her baby move at its baseline today. This is her third pregnancy and her previous two were full-term vaginal deliveries. She denies loss of amniotic fluid, vaginal bleeding, dysuria, urinary frequency, diarrhea, or any other associated symptoms. Her pregnancy is followed by Dr. Emelda Fear.  Past Medical History:  Diagnosis Date  . Asthma   . Bipolar 1 disorder (HCC)   . Depression     There are no active problems to display for this patient.   Past Surgical History:  Procedure Laterality Date  . CHOLECYSTECTOMY    . MYRINGOTOMY      OB History    Gravida Para Term Preterm AB Living   3 2 2     2    SAB TAB Ectopic Multiple Live Births                   Home Medications    Prior to Admission medications   Medication Sig Start Date End Date  Taking? Authorizing Provider  Prenatal Vit-Fe Fumarate-FA (PRENATAL MULTIVITAMIN) TABS tablet Take 1 tablet by mouth daily at 12 noon.   Yes [provider]    Family History Family History  Problem Relation Age of Onset  . Hypertension Mother   . Diabetes Father   . Hypertension Father   . Cancer Father   . Diabetes Other     Social History Social History  Substance Use Topics  . Smoking status: Current Every Day Smoker    Packs/day: 0.25    Years: 8.00    Types: Cigarettes    Last attempt to quit: 12/21/2015  . Smokeless tobacco: Never Used  . Alcohol use No     Allergies   Yellow jacket venom; Amoxicillin; Celexa [citalopram]; Ketorolac tromethamine; Penicillins; Tramadol; and Vistaril [hydroxyzine hcl]   Review of Systems Review of Systems All other systems reviewed and are negative for acute change except as noted in the HPI. Physical Exam Updated Vital Signs BP 129/67 (BP Location: Left Arm)   Pulse (!) 114   Temp 98.4 F (36.9 C) (Oral)   Resp 20   Ht 5\' 3"  (1.6 m)   Wt 157 lb (71.2 kg)   LMP 04/21/2016 (Exact Date)   SpO2 100%   BMI 27.81 kg/m   Physical Exam  Constitutional: She is oriented to person, place, and time. She appears well-developed and well-nourished. No distress.  HENT:  Head: Normocephalic and atraumatic.  Eyes: EOM are normal.  Neck: Normal range of motion.  Cardiovascular: Normal rate and regular rhythm.   Pulmonary/Chest: Effort normal and breath sounds normal.  Abdominal: Soft. She exhibits no mass. There is no tenderness. There is no guarding.  Gravid uterus consistent with dates.  Genitourinary:  Genitourinary Comments: Cervix is fingertip and thick.  Chaperone present.  No bloody show  Musculoskeletal: Normal range of motion.  Neurological: She is alert and oriented to person, place, and time.  Skin: Skin is warm and dry.  Psychiatric: She has a normal mood and affect. Judgment normal.  Nursing note and vitals  reviewed.  ED Treatments / Results  Labs (all labs ordered are listed, but only abnormal results are displayed) Labs Reviewed  URINALYSIS, ROUTINE W REFLEX MICROSCOPIC - Abnormal; Notable for the following:       Result Value   APPearance HAZY (*)    Leukocytes, UA TRACE (*)    Squamous Epithelial / LPF 0-5 (*)    All other components within normal limits  CBC - Abnormal; Notable for the following:    RBC 3.51 (*)    Hemoglobin 11.2 (*)    HCT 31.2 (*)    Platelets 99 (*)    All other components within normal limits  BASIC METABOLIC PANEL - Abnormal; Notable for the following:    Sodium 133 (*)    Potassium 3.0 (*)    CO2 21 (*)    BUN 5 (*)    Creatinine, Ser 0.41 (*)    Calcium 8.7 (*)    All other components within normal limits  URINE CULTURE    EKG  EKG Interpretation None       Radiology No results found.  Procedures Procedures (including critical care time)  DIAGNOSTIC STUDIES: Oxygen Saturation is 100% on RA, normal by my interpretation.    COORDINATION OF CARE: 11:16 PM Discussed treatment plan with pt at bedside and pt agreed to plan.  Medications Ordered in ED Medications  sodium chloride 0.9 % bolus 1,000 mL (not administered)  ondansetron (ZOFRAN) injection 4 mg (not administered)  HYDROcodone-acetaminophen (NORCO/VICODIN) 5-325 MG per tablet 1 tablet (not administered)     Initial Impression / Assessment and Plan / ED Course  I have reviewed the triage vital signs and the nursing notes.  Pertinent labs & imaging results that were available during my care of the patient were reviewed by me and considered in my medical decision making (see chart for details).     Patient with 6-30 white blood cells.  Patient be covered for possible early urinary tract infection given lower abdominal discomfort.  No organized contractions.  Mild uterine irritability noted.  IV fluids given with improvement in irritability.  Discharge home in good  condition  Final Clinical Impressions(s) / ED Diagnoses   Final diagnoses:  Lower abdominal pain  [redacted] weeks gestation of pregnancy    New Prescriptions New Prescriptions   CEPHALEXIN (KEFLEX) 500 MG CAPSULE    Take 1 capsule (500 mg total) by mouth 3 (three) times daily.   I personally performed the services described in this documentation, which was scribed in my presence. The recorded information has been reviewed and is accurate.       Azalia Bilisampos, Shant Hence, MD 10/02/16 [phone]

## 2016-10-02 MED ORDER — CEPHALEXIN 500 MG PO CAPS: 500.0000 mg | ORAL_CAPSULE | Freq: Three times a day (TID) | ORAL | 0 refills | Status: DC

## 2016-10-02 MED ORDER — CEPHALEXIN 500 MG PO CAPS
500.0000 mg | ORAL_CAPSULE | Freq: Once | ORAL | Status: AC
  Administered 2016-10-02: 500 mg via ORAL
  Filled 2016-10-02: qty 1

## 2016-10-02 NOTE — ED Notes (Signed)
Called Brittany Payne OB-rapid response

## 2016-10-04 LAB — URINE CULTURE: Culture: >=100000 — AB

## 2016-10-05 ENCOUNTER — Telehealth: Payer: Self-pay

## 2016-10-05 NOTE — Telephone Encounter (Signed)
Post ED Visit - Positive Culture Follow-up  Culture report reviewed by antimicrobial stewardship pharmacist:  []  Enzo BiNathan Batchelder, Pharm.D. []  Celedonio MiyamotoJeremy Frens, Pharm.D., BCPS AQ-ID []  Garvin FilaMike Maccia, Pharm.D., BCPS []  Georgina PillionElizabeth Martin, [address] BCPS []  StockdaleMinh Pham, [address]Pharm[address]D., BCPS, AAHIVP []  Estella HuskMichelle Turner, Pharm.D., BCPS, AAHIVP [x]  Lysle Pearlachel Rumbarger, PharmD, BCPS []  Casilda Carlsaylor Stone, PharmD, BCPS []  Pollyann SamplesAndy Johnston, PharmD, BCPS  Positive urine culture Treated with Cephalexin, organism sensitive to the same and no further patient follow-up is required at this time.  Jerry CarasCullom, Acire Tang Burnett 10/05/2016, 9:45 AM

## 2016-10-09 ENCOUNTER — Emergency Department (HOSPITAL_COMMUNITY)
Admission: EM | Admit: 2016-10-09 | Discharge: 2016-10-09 | Disposition: A | Discharge status: Home / Self Care | Payer: Self-pay | Attending: Emergency Medicine | Admitting: Emergency Medicine

## 2016-10-09 ENCOUNTER — Encounter (HOSPITAL_COMMUNITY): Payer: Self-pay | Admitting: Emergency Medicine

## 2016-10-09 DIAGNOSIS — F17210 Nicotine dependence, cigarettes, uncomplicated: Secondary | ICD-10-CM | POA: Insufficient documentation

## 2016-10-09 DIAGNOSIS — F1290 Cannabis use, unspecified, uncomplicated: Secondary | ICD-10-CM | POA: Insufficient documentation

## 2016-10-09 DIAGNOSIS — Z792 Long term (current) use of antibiotics: Secondary | ICD-10-CM | POA: Insufficient documentation

## 2016-10-09 DIAGNOSIS — J45909 Unspecified asthma, uncomplicated: Secondary | ICD-10-CM | POA: Insufficient documentation

## 2016-10-09 DIAGNOSIS — O99512 Diseases of the respiratory system complicating pregnancy, second trimester: Secondary | ICD-10-CM | POA: Insufficient documentation

## 2016-10-09 DIAGNOSIS — J029 Acute pharyngitis, unspecified: Secondary | ICD-10-CM | POA: Insufficient documentation

## 2016-10-09 DIAGNOSIS — O99332 Smoking (tobacco) complicating pregnancy, second trimester: Secondary | ICD-10-CM | POA: Insufficient documentation

## 2016-10-09 DIAGNOSIS — Z3A27 27 weeks gestation of pregnancy: Secondary | ICD-10-CM | POA: Insufficient documentation

## 2016-10-09 LAB — RAPID STREP SCREEN (MED CTR MEBANE ONLY): Streptococcus, Group A Screen (Direct): NEGATIVE

## 2016-10-09 MED ORDER — DEXTROMETHORPHAN HBR 15 MG/5ML PO SYRP: 10.0000 mL | ORAL_SOLUTION | Freq: Four times a day (QID) | ORAL | 0 refills | Status: DC | PRN

## 2016-10-09 NOTE — ED Triage Notes (Signed)
Pt reports sore throat x2 days. Pt reports history of strep. Airway patent. Moderate redness noted to throat. nad noted.

## 2016-10-09 NOTE — Discharge Instructions (Signed)
Your strep test is negative today.  Your symptoms are likely to be viral which will runs its course.  You may safely take the medicine prescribed to help with the cough and cepacol ozenges (no prescription needed) can be very helpful for sore throat pain.  You may also benefit from warm salt gargles, drinking warm tea can also help sooth a sore throat.

## 2016-10-11 LAB — CULTURE, GROUP A STREP (THRC)

## 2016-10-11 NOTE — ED Provider Notes (Signed)
[facility] Provider Note   CSN: [id number] Arrival date & time: 10/09/16  1128     History   Chief Complaint Chief Complaint  Patient presents with  . Sore Throat    HPI Brittany Payne is a 26 y.o. female who is currently 4327 weeks pregnant presenting with a 24 hour history of sore throat.  She endorses sharp pain with swallowing and is concerned for possible strep throat although denies any known exposures. She has had mild nasal congestion with clear rhinorrhea, and a nonproductive cough.  She denies fever, ear pain, drainage, sinus pain, sob, cp or other complaint.  She was seen here for abdominal pain one week ago which she states is improved.  She has had no treatment prior to arrival.  The history is provided by the patient.    Past Medical History:  Diagnosis Date  . Asthma   . Bipolar 1 disorder (HCC)   . Depression     There are no active problems to display for this patient.   Past Surgical History:  Procedure Laterality Date  . CHOLECYSTECTOMY    . MYRINGOTOMY      OB History    Gravida Para Term Preterm AB Living   3 2 2     2    SAB TAB Ectopic Multiple Live Births                   Home Medications    Prior to Admission medications   Medication Sig Start Date End Date Taking? Authorizing Provider  cephALEXin (KEFLEX) 500 MG capsule Take 1 capsule (500 mg total) by mouth 3 (three) times daily. 10/02/16   Azalia Bilisampos, Kevin, MD  dextromethorphan 15 MG/5ML syrup Take 10 mLs (30 mg total) by mouth 4 (four) times daily as needed for cough. 10/09/16   Burgess AmorIdol, Sherrika Weakland, PA-C  Prenatal Vit-Fe Fumarate-FA (PRENATAL MULTIVITAMIN) TABS tablet Take 1 tablet by mouth daily at 12 noon.    [provider]    Family History Family History  Problem Relation Age of Onset  . Hypertension Mother   . Diabetes Father   . Hypertension Father   . Cancer Father   . Diabetes Other     Social History Social History  Substance Use Topics  . Smoking status:  Current Every Day Smoker    Packs/day: 0.25    Years: 8.00    Types: Cigarettes    Last attempt to quit: 12/21/2015  . Smokeless tobacco: Never Used  . Alcohol use No     Allergies   Yellow jacket venom; Amoxicillin; Celexa [citalopram]; Ketorolac tromethamine; Penicillins; Tramadol; and Vistaril [hydroxyzine hcl]   Review of Systems Review of Systems  Constitutional: Negative for chills and fever.  HENT: Positive for congestion, rhinorrhea and sore throat. Negative for ear pain, sinus pressure, trouble swallowing and voice change.   Eyes: Negative for discharge.  Respiratory: Positive for cough. Negative for shortness of breath, wheezing and stridor.   Cardiovascular: Negative for chest pain.  Gastrointestinal: Negative for abdominal pain, nausea and vomiting.  Genitourinary: Negative.      Physical Exam Updated Vital Signs BP 117/67 (BP Location: Right Arm)   Pulse 92   Temp 98 F (36.7 C) (Oral)   Resp 18   Ht 5\' 2"  (1.575 m)   Wt 71.2 kg (157 lb)   LMP 04/21/2016 (Exact Date)   SpO2 99%   BMI 28.72 kg/m   Physical Exam  Constitutional: She is oriented to person, place, and  time. She appears well-developed and well-nourished.  HENT:  Head: Normocephalic and atraumatic.  Right Ear: Tympanic membrane and ear canal normal.  Left Ear: Tympanic membrane and ear canal normal.  Nose: Rhinorrhea present. No mucosal edema.  Mouth/Throat: Uvula is midline and mucous membranes are normal. No trismus in the jaw. No uvula swelling. Posterior oropharyngeal erythema present. No oropharyngeal exudate, posterior oropharyngeal edema or tonsillar abscesses. Tonsils are 1+ on the right. Tonsils are 1+ on the left. No tonsillar exudate.  Mild posterior pharyngeal erythema.  Eyes: Conjunctivae are normal.  Cardiovascular: Normal rate and normal heart sounds.   Pulmonary/Chest: Effort normal. No respiratory distress. She has no wheezes. She has no rales.  Abdominal: Soft. There is no  tenderness.  Musculoskeletal: Normal range of motion.  Neurological: She is alert and oriented to person, place, and time.  Skin: Skin is warm and dry. No rash noted.  Psychiatric: She has a normal mood and affect.     ED Treatments / Results  Labs (all labs ordered are listed, but only abnormal results are displayed) Labs Reviewed  RAPID STREP SCREEN (NOT AT [facility])  CULTURE, GROUP A STREP [facility])    EKG  EKG Interpretation None       Radiology No results found.  Procedures Procedures (including critical care time)  Medications Ordered in ED Medications - No data to display   Initial Impression / Assessment and Plan / ED Course  I have reviewed the triage vital signs and the nursing notes.  Pertinent labs & imaging results that were available during my care of the patient were reviewed by me and considered in my medical decision making (see chart for details).     No evidence for acute tonsillitis , strep negative, cx pending.  Pt recommended tylenol, cepacol throat lozenges. Increased fluids. Salt gargles, recheck by pcp prn.  Final Clinical Impressions(s) / ED Diagnoses   Final diagnoses:  Viral pharyngitis    New Prescriptions Discharge Medication List as of 10/09/2016  1:27 PM    START taking these medications   Details  dextromethorphan 15 MG/5ML syrup Take 10 mLs (30 mg total) by mouth 4 (four) times daily as needed for cough., Starting Wed 10/09/2016, Print         Burgess Amor, PA-C 10/11/16 1019    Bethann Berkshire, MD 10/12/16 [phone]

## 2016-10-31 ENCOUNTER — Emergency Department (HOSPITAL_COMMUNITY)
Admission: EM | Admit: 2016-10-31 | Discharge: 2016-10-31 | Disposition: A | Discharge status: Home / Self Care | Payer: Self-pay | Attending: Emergency Medicine | Admitting: Emergency Medicine

## 2016-10-31 ENCOUNTER — Encounter (HOSPITAL_COMMUNITY): Payer: Self-pay

## 2016-10-31 DIAGNOSIS — O26893 Other specified pregnancy related conditions, third trimester: Secondary | ICD-10-CM

## 2016-10-31 DIAGNOSIS — R109 Unspecified abdominal pain: Secondary | ICD-10-CM

## 2016-10-31 DIAGNOSIS — F17210 Nicotine dependence, cigarettes, uncomplicated: Secondary | ICD-10-CM | POA: Insufficient documentation

## 2016-10-31 DIAGNOSIS — Z3A37 37 weeks gestation of pregnancy: Secondary | ICD-10-CM | POA: Insufficient documentation

## 2016-10-31 DIAGNOSIS — O2343 Unspecified infection of urinary tract in pregnancy, third trimester: Secondary | ICD-10-CM | POA: Insufficient documentation

## 2016-10-31 DIAGNOSIS — R1030 Lower abdominal pain, unspecified: Secondary | ICD-10-CM | POA: Insufficient documentation

## 2016-10-31 DIAGNOSIS — R8281 Pyuria: Secondary | ICD-10-CM

## 2016-10-31 DIAGNOSIS — O9989 Other specified diseases and conditions complicating pregnancy, childbirth and the puerperium: Secondary | ICD-10-CM | POA: Insufficient documentation

## 2016-10-31 DIAGNOSIS — J45909 Unspecified asthma, uncomplicated: Secondary | ICD-10-CM | POA: Insufficient documentation

## 2016-10-31 DIAGNOSIS — Z79899 Other long term (current) drug therapy: Secondary | ICD-10-CM | POA: Insufficient documentation

## 2016-10-31 LAB — URINALYSIS, ROUTINE W REFLEX MICROSCOPIC
BILIRUBIN URINE: NEGATIVE
GLUCOSE, UA: NEGATIVE mg/dL
HGB URINE DIPSTICK: NEGATIVE
KETONES UR: NEGATIVE mg/dL
NITRITE: NEGATIVE
PROTEIN: NEGATIVE mg/dL
Specific Gravity, Urine: 1.004 — ABNORMAL LOW (ref 1.005–1.030)
pH: 6 (ref 5.0–8.0)

## 2016-10-31 MED ORDER — ACETAMINOPHEN 325 MG PO TABS
650.0000 mg | ORAL_TABLET | Freq: Once | ORAL | Status: AC
  Administered 2016-10-31: 650 mg via ORAL
  Filled 2016-10-31: qty 2

## 2016-10-31 MED ORDER — CEPHALEXIN 500 MG PO CAPS: 500.0000 mg | ORAL_CAPSULE | Freq: Three times a day (TID) | ORAL | 0 refills | Status: DC

## 2016-10-31 NOTE — ED Triage Notes (Signed)
Pt states she lost her mucus plug this evening and has been having lower abd cramping/pain for several hours, states she feels like she is having contractions

## 2016-10-31 NOTE — Discharge Instructions (Signed)
You were seen today for abdominal pain during pregnancy. You do not appear to be contracting. Her baby looks good on the monitor. Your urine did show some white cells. Given the you are pregnant and having abdominal pain, you will be started on antibiotics for a presumed urinary tract infection. [facility] any pregnancy related issues as [facility] does not have obstetric services inpatient. If you have any new or worsening symptoms you should be reevaluated.

## 2016-10-31 NOTE — Progress Notes (Addendum)
[facility] notified of patient's arrival to [facility] ED with complaints of losing mucus plug and feeling contractions; [place] and [facility]; patient is a G3P2 who is 1027 and 3/[redacted] weeks along in her pregnancy at this time; EFM applied by ED staff and will continue to monitor at this time; ED physician assessed SVE and states patient is Closed and thick at this time

## 2016-10-31 NOTE — ED Provider Notes (Signed)
[facility] Provider Note   CSN: [id number] Arrival date & time: 10/31/16  0055     History   Chief Complaint No chief complaint on file.   HPI Brittany Payne is a 26 y.o. female.  HPI  This is a 26 year old G3 P2 female approximately [redacted] weeks pregnant who presents with abdominal pain and what she feels like her contractions. She reports at 6 PM she had onset of pain that radiates from her lower abdomen into her perineum. Similar to prior labors. She also reports loss of "mucus." Denies any blood loss. No history of preterm deliveries. She is followed primarily at the health department. Unknown blood type.  Past Medical History:  Diagnosis Date  . Asthma   . Bipolar 1 disorder (HCC)   . Depression     There are no active problems to display for this patient.   Past Surgical History:  Procedure Laterality Date  . CHOLECYSTECTOMY    . MYRINGOTOMY      OB History    Gravida Para Term Preterm AB Living   3 2 2     2    SAB TAB Ectopic Multiple Live Births                   Home Medications    Prior to Admission medications   Medication Sig Start Date End Date Taking? Authorizing Provider  acetaminophen (TYLENOL) 325 MG tablet Take 650 mg by mouth every 6 (six) hours as needed.   Yes [provider]  Prenatal Vit-Fe Fumarate-FA (PRENATAL MULTIVITAMIN) TABS tablet Take 1 tablet by mouth daily at 12 noon.   Yes [provider]  cephALEXin (KEFLEX) 500 MG capsule Take 1 capsule (500 mg total) by mouth 3 (three) times daily. 10/31/16   Horton, Mayer Maskerourtney F, MD  dextromethorphan 15 MG/5ML syrup Take 10 mLs (30 mg total) by mouth 4 (four) times daily as needed for cough. 10/09/16   Burgess AmorIdol, Julie, PA-C    Family History Family History  Problem Relation Age of Onset  . Hypertension Mother   . Diabetes Father   . Hypertension Father   . Cancer Father   . Diabetes Other     Social History Social History  Substance Use Topics  . Smoking status:  Current Every Day Smoker    Packs/day: 0.25    Years: 8.00    Types: Cigarettes    Last attempt to quit: 12/21/2015  . Smokeless tobacco: Never Used  . Alcohol use No     Allergies   Yellow jacket venom; Amoxicillin; Celexa [citalopram]; Ketorolac tromethamine; Penicillins; Tramadol; and Vistaril [hydroxyzine hcl]   Review of Systems Review of Systems  Constitutional: Negative for fever.  Gastrointestinal: Positive for abdominal distention.  Genitourinary: Positive for vaginal discharge. Negative for dysuria and vaginal bleeding.  All other systems reviewed and are negative.    Physical Exam Updated Vital Signs BP 116/72 (BP Location: Left Arm)   Pulse 87   Temp 97.9 F (36.6 C) (Oral)   Resp 20   LMP 04/21/2016 (Exact Date)   SpO2 100%   Physical Exam  Constitutional: She is oriented to person, place, and time. She appears well-developed and well-nourished.  No acute distress  HENT:  Head: Normocephalic and atraumatic.  Cardiovascular: Normal rate and regular rhythm.   Pulmonary/Chest: Effort normal. No respiratory distress. She has no wheezes.  Abdominal: Soft. Bowel sounds are normal. There is no tenderness.  Abdomen gravid above the umbilicus  Genitourinary:  Genitourinary Comments:  No blood noted, cervix is high, thick, and fingertip  Neurological: She is alert and oriented to person, place, and time.  Skin: Skin is warm and dry.  Psychiatric: She has a normal mood and affect.  Nursing note and vitals reviewed.    ED Treatments / Results  Labs (all labs ordered are listed, but only abnormal results are displayed) Labs Reviewed  URINALYSIS, ROUTINE W REFLEX MICROSCOPIC - Abnormal; Notable for the following:       Result Value   APPearance HAZY (*)    Specific Gravity, Urine 1.004 (*)    Leukocytes, UA SMALL (*)    Bacteria, UA RARE (*)    Squamous Epithelial / LPF 6-30 (*)    All other components within normal limits  URINE CULTURE    EKG  EKG  Interpretation None       Radiology No results found.  Procedures Procedures (including critical care time)  Medications Ordered in ED Medications  acetaminophen (TYLENOL) tablet 650 mg (650 mg Oral Given 10/31/16 0219)     Initial Impression / Assessment and Plan / ED Course  I have reviewed the triage vital signs and the nursing notes.  Pertinent labs & imaging results that were available during my care of the patient were reviewed by me and considered in my medical decision making (see chart for details).     Patient presents with abdominal pain. She is in third trimester pregnancy. She is nontoxic. No indications of eminent delivery. Patient was connected to Tocometry and fetal heart rate monitoring. No evidence of contractions and tracing of fetal heart rate good per [facility]. Urinalysis does show 6-30 white cells and rare bacteria. Given abdominal pain, will treat this pyuria given that the patient is pregnant. Patient discharged on Keflex. Patient advised that there are not any inpatient obstetric services at [facility]. She needs to go [facility] or back to [facility] for any further pregnancy related issues.  After history, exam, and medical workup I feel the patient has been appropriately medically screened and is safe for discharge home. Pertinent diagnoses were discussed with the patient. Patient was given return precautions.   Final Clinical Impressions(s) / ED Diagnoses   Final diagnoses:  Abdominal pain during pregnancy in third trimester  Pyuria    New Prescriptions New Prescriptions   CEPHALEXIN (KEFLEX) 500 MG CAPSULE    Take 1 capsule (500 mg total) by mouth 3 (three) times daily.     Shon Baton, MD 10/31/16 [phone]

## 2016-10-31 NOTE — Progress Notes (Signed)
Dr Emelda FearFerguson made aware of patient's arrival and complaints at this time; EFM reviewed and no contractions noted at this time; orders given to discharge patient home at this time

## 2016-11-01 LAB — URINE CULTURE: Culture: NO GROWTH

## 2017-02-04 IMAGING — CR DG KNEE COMPLETE 4+V*L*
1 series · 4 of 4 positions shown · non-contrast
Comparison: 04/13/2012

CLINICAL DATA: Walking to work, left knee popped and pt fell, pain
medial knee and swelling

EXAM:
LEFT KNEE - COMPLETE 4+ VIEW

[Series 2: ap · 0.17mm/px · 4 of 4 slices shown]
[im 1/4]
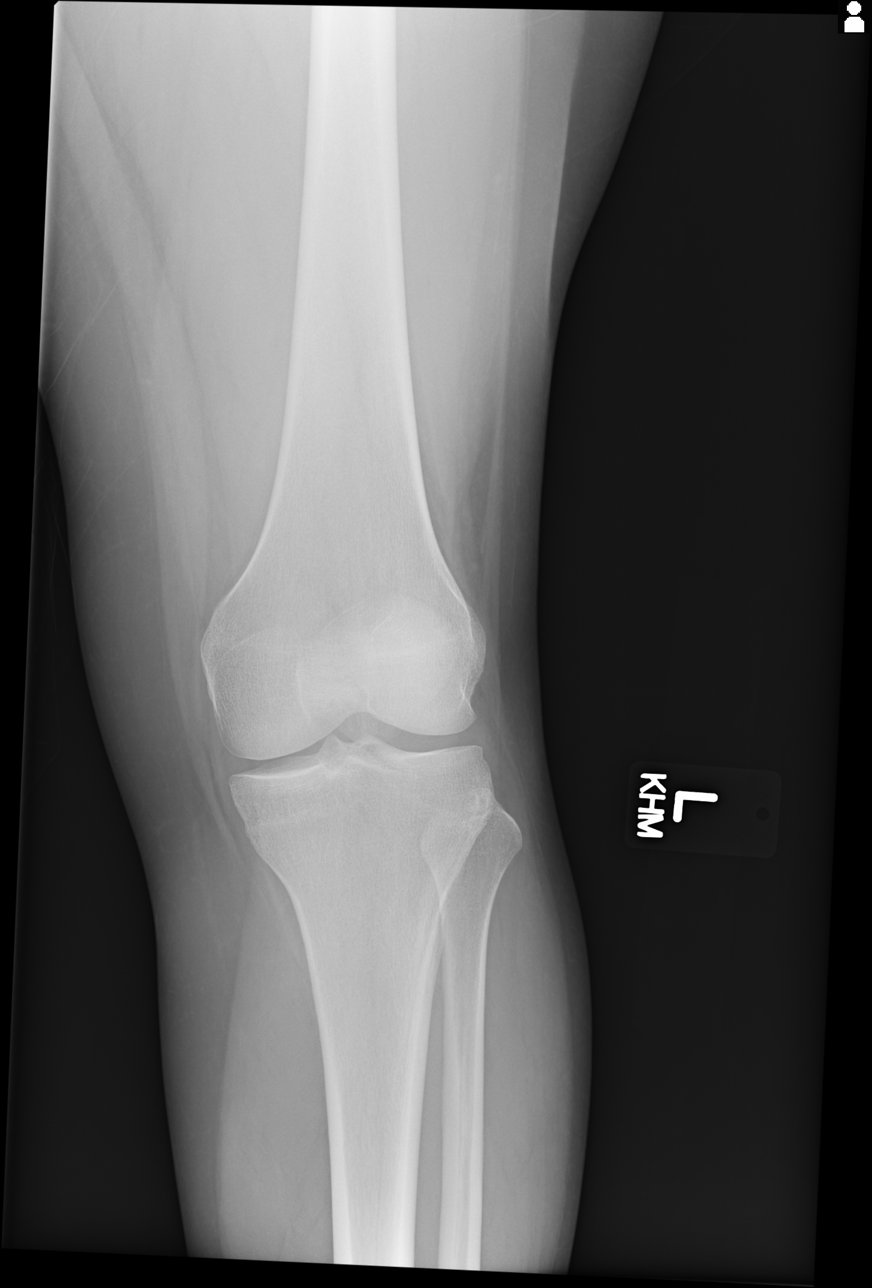
[im 2/4]
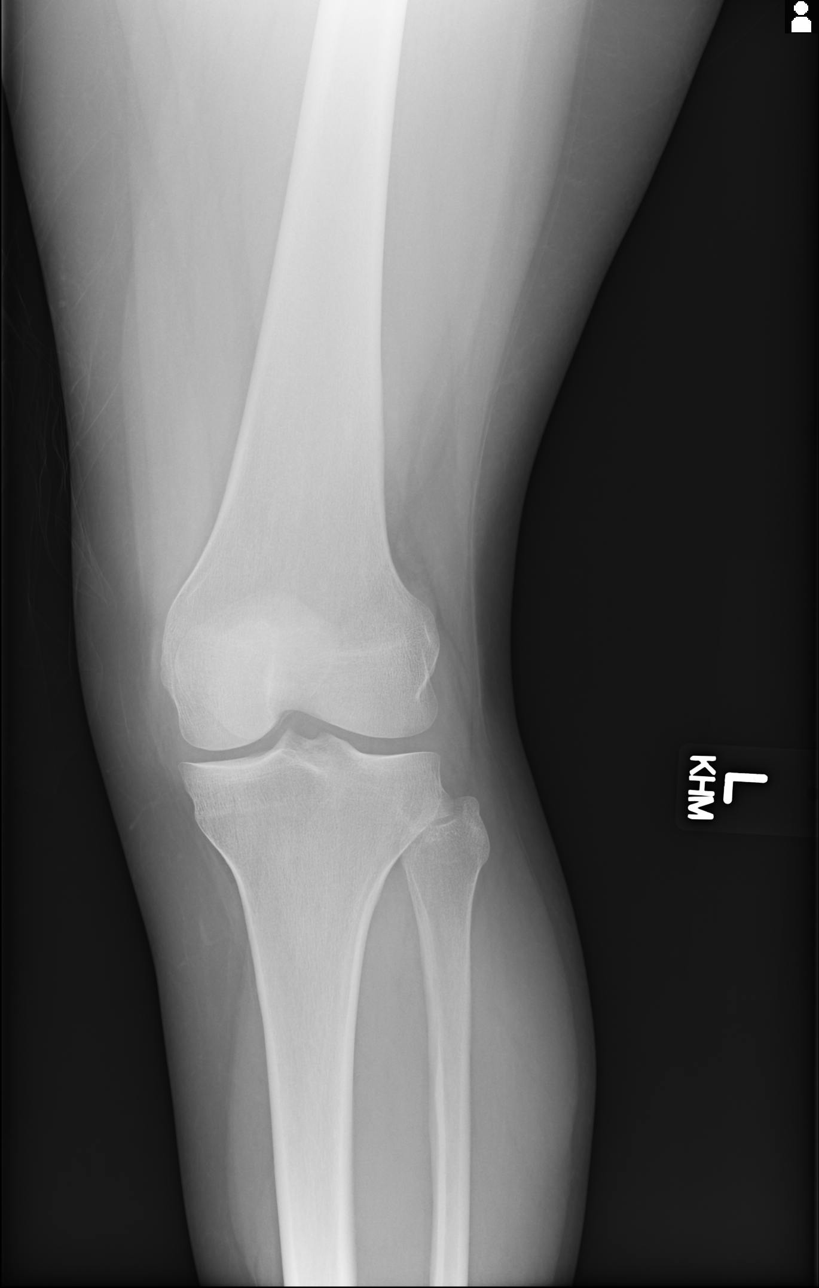
[im 3/4]
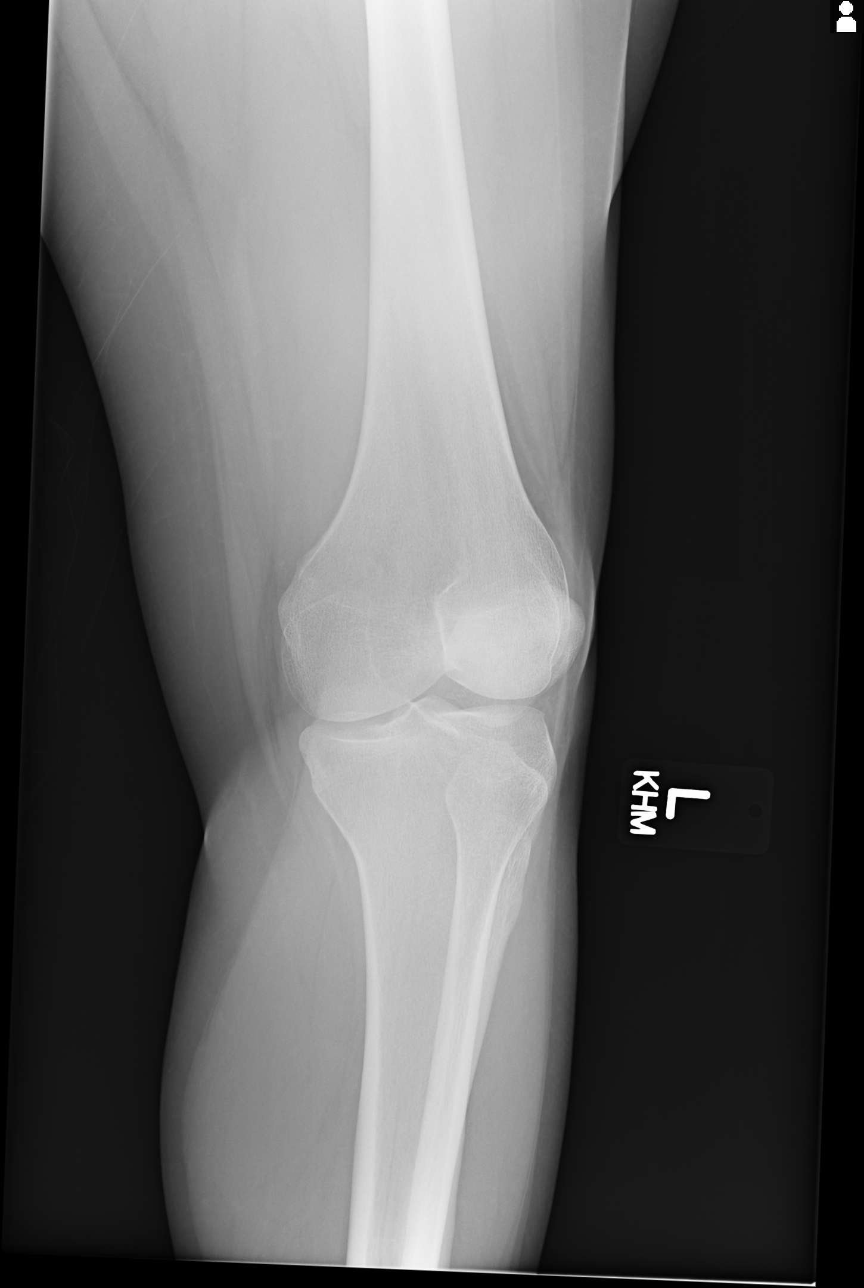
[im 4/4]
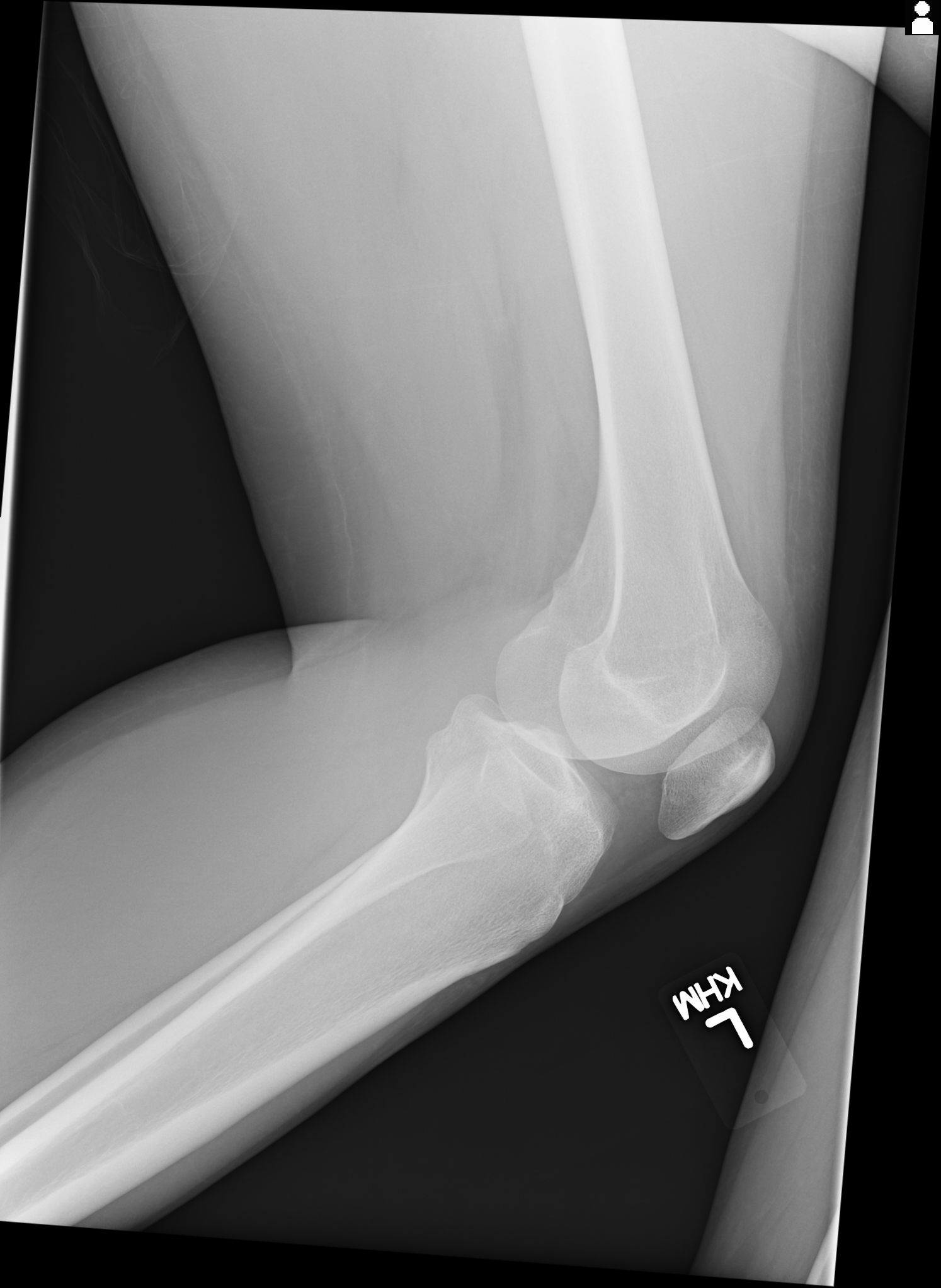

[4 of 4 positions shown; findings below may reference images not displayed]

FINDINGS: There is no evidence of fracture, dislocation, or joint effusion.
There is no evidence of arthropathy or other focal bone abnormality.
Soft tissues are unremarkable.
IMPRESSION: Negative.

## 2017-02-05 DIAGNOSIS — R161 Splenomegaly, not elsewhere classified: Secondary | ICD-10-CM | POA: Insufficient documentation

## 2017-02-05 DIAGNOSIS — G43909 Migraine, unspecified, not intractable, without status migrainosus: Secondary | ICD-10-CM | POA: Insufficient documentation

## 2017-02-05 DIAGNOSIS — D649 Anemia, unspecified: Secondary | ICD-10-CM | POA: Insufficient documentation

## 2017-02-05 DIAGNOSIS — F418 Other specified anxiety disorders: Secondary | ICD-10-CM | POA: Insufficient documentation

## 2017-02-05 DIAGNOSIS — F319 Bipolar disorder, unspecified: Secondary | ICD-10-CM | POA: Insufficient documentation

## 2018-09-06 ENCOUNTER — Encounter (HOSPITAL_COMMUNITY): Payer: Self-pay | Admitting: *Deleted

## 2018-09-06 ENCOUNTER — Other Ambulatory Visit: Payer: Self-pay

## 2018-09-06 ENCOUNTER — Emergency Department (HOSPITAL_COMMUNITY): Payer: Medicaid Other

## 2018-09-06 ENCOUNTER — Emergency Department (HOSPITAL_COMMUNITY)
Admission: EM | Admit: 2018-09-06 | Discharge: 2018-09-06 | Disposition: A | Discharge status: Home / Self Care | Payer: Medicaid Other | Attending: Emergency Medicine | Admitting: Emergency Medicine

## 2018-09-06 DIAGNOSIS — J45909 Unspecified asthma, uncomplicated: Secondary | ICD-10-CM | POA: Diagnosis not present

## 2018-09-06 DIAGNOSIS — R079 Chest pain, unspecified: Secondary | ICD-10-CM | POA: Insufficient documentation

## 2018-09-06 DIAGNOSIS — Z79899 Other long term (current) drug therapy: Secondary | ICD-10-CM | POA: Diagnosis not present

## 2018-09-06 DIAGNOSIS — F17210 Nicotine dependence, cigarettes, uncomplicated: Secondary | ICD-10-CM | POA: Insufficient documentation

## 2018-09-06 NOTE — Discharge Instructions (Signed)
Your testing has been normal with a normal chest x-ray and a normal EKG.  This shows no signs of heart disease, lung disease or any other serious conditions.  Your symptoms are likely related to underlying anxiety regarding the situation with your family.  Please follow-up with your family doctor as needed, seek medical exam for severe or worsening symptoms.  Take a maximum of 400 mg of ibuprofen 3 times a day as needed.

## 2018-09-06 NOTE — ED Provider Notes (Signed)
[facility] EMERGENCY DEPARTMENT Provider Note   CSN: [id number] Arrival date & time: 09/06/18  0805    History   Chief Complaint Chief Complaint  Patient presents with  . Chest Pain    HPI Brittany Payne is a 28 y.o. female.     HPI  The patient is a 28 year old female, she presents to the hospital today with a complaint of left-sided chest pain.  The chest pain is left-sided The chest pain is sharp and sometimes dull The chest pain is not made worse with deep breathing or position The patient denies any associated fevers, coughing, shortness of breath, swelling of the legs.  The patient does report that the pain started at approximately 5:00 this morning when she woke up.  She was tearful, upset because her mother had overdosed on her medication intentionally trying to kill herself and lost consciousness in front of her.  She is now currently admitted to the hospital.  The patient did take 2 baby aspirin at the advice of the paramedics who were called to her house this morning  The patient endorses being treated for bipolar disorder in the past though she has not had medications in 5 years.  She does not feel particularly manic, she feels like she has occasional mood swings but overall has done well and does not follow with a psychiatrist nor does she take any daily medications.  Her risk factors for coronary disease including smoking 2 packs of cigarettes a day.  Past Medical History:  Diagnosis Date  . Asthma   . Bipolar 1 disorder (HCC)   . Depression     There are no active problems to display for this patient.   Past Surgical History:  Procedure Laterality Date  . CHOLECYSTECTOMY    . MYRINGOTOMY       OB History    Gravida  3   Para  2   Term  2   Preterm      AB      Living  2     SAB      TAB      Ectopic      Multiple      Live Births               Home Medications    Prior to Admission medications   Medication Sig Start  Date End Date Taking? Authorizing Provider  acetaminophen (TYLENOL) 325 MG tablet Take 650 mg by mouth every 6 (six) hours as needed.    [provider]  sertraline (ZOLOFT) 50 MG tablet Take 1 tablet by mouth daily.    [provider]    Family History Family History  Problem Relation Age of Onset  . Hypertension Mother   . Diabetes Father   . Hypertension Father   . Cancer Father   . Diabetes Other     Social History Social History   Tobacco Use  . Smoking status: Current Every Day Smoker    Packs/day: 2.00    Years: 8.00    Pack years: 16.00    Types: Cigarettes    Last attempt to quit: 12/21/2015    Years since quitting: 2.7  . Smokeless tobacco: Never Used  Substance Use Topics  . Alcohol use: No  . Drug use: Yes    Types: Marijuana    Comment: last used 2 days ago     Allergies   Yellow jacket venom; Amoxicillin; Celexa [citalopram]; Ketorolac tromethamine; Penicillins; Tramadol; and  Vistaril [hydroxyzine hcl]   Review of Systems Review of Systems  Constitutional: Negative for fever.  HENT: Negative for sore throat.   Eyes: Negative for redness.  Respiratory: Negative for cough and shortness of breath.   Cardiovascular: Positive for chest pain.  Gastrointestinal: Negative for nausea and vomiting.  Genitourinary: Negative for dysuria.  Musculoskeletal: Negative for back pain and neck pain.  Skin: Negative for rash and wound.  Neurological: Negative for weakness, numbness and headaches.  Hematological: Does not bruise/bleed easily.  Psychiatric/Behavioral: Negative for hallucinations. The patient is nervous/anxious.      Physical Exam Updated Vital Signs BP 119/79   Pulse 71   Temp 97.7 F (36.5 C) (Oral)   Resp 19   Ht 1.575 m (5\' 2" )   Wt 71.7 kg   LMP 09/05/2018   SpO2 98%   Breastfeeding No   BMI 28.90 kg/m   Physical Exam Constitutional:      Comments: Well-appearing, tearful and anxious but no acute distress  HENT:      Head:     Comments: Normocephalic and atraumatic Neck:     Comments: No lymphadenopathy, supple neck, midline trachea Cardiovascular:     Comments: Clear heart sounds, no murmurs, rate of 90-95, normal pulses at the radial arteries, no edema or JVD Pulmonary:     Comments: Lungs are clear to auscultation bilaterally, there is no wheezing rhonchi or rales, no dullness to the left chest Chest:     Comments: Chaperone present for exam, normal-appearing left breast, no tenderness over the left breast, left chest wall, left lateral chest or left posterior chest.  No rashes seen in this distribution Abdominal:     Comments: Nontender abdomen  Musculoskeletal:     Comments: No edema, joints are supple and compartments are soft  Skin:    Comments: No rashes seen.  Neurological:     Comments: Speech is clear, coordination is normal  Psychiatric:     Comments: Tearful and anxious.      ED Treatments / Results  Labs (all labs ordered are listed, but only abnormal results are displayed) Labs Reviewed - No data to display  EKG EKG Interpretation  Date/Time:  Sunday September 06 2018 08:15:02 EDT Ventricular Rate:  92 PR Interval:    QRS Duration: 90 QT Interval:  360 QTC Calculation: 446 R Axis:   62 Text Interpretation:  Sinus rhythm Consider left atrial enlargement since 10/2014, no changes seen Confirmed by Eber Hong ([id number]) on 09/06/2018 8:21:46 AM   Radiology Dg Chest 2 View  Result Date: 09/06/2018 CLINICAL DATA:  Chest pain. EXAM: CHEST - 2 VIEW COMPARISON:  None. FINDINGS: The heart size and mediastinal contours are within normal limits. Both lungs are clear. The visualized skeletal structures are unremarkable. IMPRESSION: No active cardiopulmonary disease. Electronically Signed   By: Gerome Sam III M.D   On: 09/06/2018 08:48    Procedures Procedures (including critical care time)  Medications Ordered in ED Medications - No data to display   Initial Impression  / Assessment and Plan / ED Course  I have reviewed the triage vital signs and the nursing notes.  Pertinent labs & imaging results that were available during my care of the patient were reviewed by me and considered in my medical decision making (see chart for details).        The patient is low risk for acute coronary syndrome pulmonary embolism or aortic dissection.  There is no murmurs rubs or gallops and it is  not positional to suggest that it would be related to pericarditis.  Will obtain a chest x-ray to make sure there is no signs of pneumothorax, this is likely anxiety related given her significant recent anxiety producing event.  EKG: My interpretation is that this patient has a normal-appearing EKG.  It does have a mild sinus tachycardia on the monitor occasionally but her heart rate is 92 for the EKG with normal intervals, normal axis, normal ST segments and no signs of left ventricular hypertrophy, my impression is that this is a normal EKG.  Chest x-ray: I have personally viewed and interpreted the two-view PA and lateral chest x-ray, my interpretation is that there is no acute cardiopulmonary disease, no pneumothorax, no pneumonia, no cardiomegaly, no soft tissue injury and no sub-diaphragmatic air.  The patient has had negative results, she is stable for discharge on anti-inflammatories.  Final Clinical Impressions(s) / ED Diagnoses   Final diagnoses:  Left-sided chest pain    ED Discharge Orders    None       Eber Hong, MD 09/06/18 [phone]

## 2018-09-06 NOTE — ED Triage Notes (Signed)
Pt c/o left sided chest pain that radiates to her back and to the left hand that started this morning at 0500. Pt reports she is under a lot of stress due to her mom being in the ICU here due to an overdose. Pt is tearful at time of triage. Pt reports she called EMS this morning and they came out to see her and told her it could possibly be anxiety. Denies SOB. Pt does report dizziness and nausea.

## 2018-12-31 DIAGNOSIS — R825 Elevated urine levels of drugs, medicaments and biological substances: Secondary | ICD-10-CM | POA: Insufficient documentation

## 2019-07-26 DIAGNOSIS — K429 Umbilical hernia without obstruction or gangrene: Secondary | ICD-10-CM | POA: Insufficient documentation

## 2019-09-06 ENCOUNTER — Ambulatory Visit: Payer: Medicaid Other | Attending: Internal Medicine

## 2019-09-06 ENCOUNTER — Other Ambulatory Visit: Payer: Self-pay

## 2019-09-06 DIAGNOSIS — Z20822 Contact with and (suspected) exposure to covid-19: Secondary | ICD-10-CM

## 2019-09-07 ENCOUNTER — Telehealth: Payer: Self-pay | Admitting: *Deleted

## 2019-09-07 ENCOUNTER — Other Ambulatory Visit: Payer: Self-pay

## 2019-09-07 ENCOUNTER — Emergency Department (HOSPITAL_COMMUNITY)
Admission: EM | Admit: 2019-09-07 | Discharge: 2019-09-07 | Disposition: A | Discharge status: Home / Self Care | Payer: Medicaid Other | Attending: Emergency Medicine | Admitting: Emergency Medicine

## 2019-09-07 ENCOUNTER — Encounter (HOSPITAL_COMMUNITY): Payer: Self-pay | Admitting: *Deleted

## 2019-09-07 DIAGNOSIS — J45909 Unspecified asthma, uncomplicated: Secondary | ICD-10-CM | POA: Diagnosis not present

## 2019-09-07 DIAGNOSIS — J069 Acute upper respiratory infection, unspecified: Secondary | ICD-10-CM | POA: Diagnosis not present

## 2019-09-07 DIAGNOSIS — F17210 Nicotine dependence, cigarettes, uncomplicated: Secondary | ICD-10-CM | POA: Diagnosis not present

## 2019-09-07 DIAGNOSIS — F319 Bipolar disorder, unspecified: Secondary | ICD-10-CM | POA: Diagnosis not present

## 2019-09-07 DIAGNOSIS — R05 Cough: Secondary | ICD-10-CM | POA: Diagnosis present

## 2019-09-07 LAB — SARS-COV-2, NAA 2 DAY TAT

## 2019-09-07 LAB — NOVEL CORONAVIRUS, NAA: SARS-CoV-2, NAA: NOT DETECTED

## 2019-09-07 MED ORDER — PROMETHAZINE-DM 6.25-15 MG/5ML PO SYRP: 5.0000 mL | ORAL_SOLUTION | Freq: Four times a day (QID) | ORAL | 0 refills | Status: DC | PRN

## 2019-09-07 MED ORDER — CETIRIZINE HCL 10 MG PO TABS: 10.0000 mg | ORAL_TABLET | Freq: Every day | ORAL | 0 refills | Status: DC

## 2019-09-07 NOTE — Discharge Instructions (Addendum)
You may take Tylenol if needed for fever.  Drink plenty of fluids.  The prescribed cough medicine may cause drowsiness, do not operate machinery or drive while taking this medication.  Follow-up with your primary doctor for recheck return to ER for any worsening symptoms.

## 2019-09-07 NOTE — ED Triage Notes (Signed)
Pt with cough since Sunday green phlegm, recent hernia surgery.

## 2019-09-07 NOTE — Telephone Encounter (Signed)
Pt given result of COVID test obtained 09/06/19; she verbalized understanding.

## 2019-09-07 NOTE — ED Provider Notes (Signed)
[facility] EMERGENCY DEPARTMENT Provider Note   CSN: [id number] Arrival date & time: 09/07/19  1446     History Chief Complaint  Patient presents with  . Cough    Brittany Payne is a 29 y.o. female.  HPI      Brittany Payne is a 29 y.o. female with past medical history of asthma and bipolar disorder, she presents to the Emergency Department complaining of cough, rhinorrhea, and nasal congestion.  Symptoms began 2 days ago.  She states that her daughter has similar symptoms and she is also here for evaluation.  Patient's daughter was seen by her PCP yesterday she and her mother had negative Covid test performed yesterday.  Mother states cough is nonproductive and associated with clear rhinorrhea.  She denies chest pain or shortness of breath.  No abdominal pain, nausea, vomiting, loss of taste or smell fever or chills.  She states that she had recent abdominal hernia surgery on 07/30/2019.  She reports she has been recovering well from her surgery, but she describes increased "soreness" of her abdomen secondary to frequent coughing.  She denies redness, drainage, swelling or dehiscence of the wound.  Past Medical History:  Diagnosis Date  . Asthma   . Bipolar 1 disorder (HCC)   . Depression     There are no problems to display for this patient.   Past Surgical History:  Procedure Laterality Date  . CHOLECYSTECTOMY    . MYRINGOTOMY       OB History    Gravida  3   Para  2   Term  2   Preterm      AB      Living  2     SAB      TAB      Ectopic      Multiple      Live Births              Family History  Problem Relation Age of Onset  . Hypertension Mother   . Diabetes Father   . Hypertension Father   . Cancer Father   . Diabetes Other     Social History   Tobacco Use  . Smoking status: Current Every Day Smoker    Packs/day: 2.00    Years: 8.00    Pack years: 16.00    Types: Cigarettes    Last attempt to quit: 12/21/2015    Years since  quitting: 3.7  . Smokeless tobacco: Never Used  Substance Use Topics  . Alcohol use: No  . Drug use: Yes    Types: Marijuana    Comment: last used 2 days ago    Home Medications Prior to Admission medications   Medication Sig Start Date End Date Taking? Authorizing Provider  acetaminophen (TYLENOL) 325 MG tablet Take 650 mg by mouth every 6 (six) hours as needed for mild pain.    Yes [provider]  albuterol (VENTOLIN HFA) 108 (90 Base) MCG/ACT inhaler Inhale 2 puffs into the lungs every 4 (four) hours as needed for wheezing or shortness of breath.   Yes [provider]  sertraline (ZOLOFT) 50 MG tablet Take 1 tablet by mouth daily.   Yes [provider]    Allergies    Yellow jacket venom, Amoxicillin, Celexa [citalopram], Ketorolac tromethamine, Penicillins, Tramadol, and Vistaril [hydroxyzine hcl]  Review of Systems   Review of Systems  Constitutional: Negative for appetite change, chills and fever.  HENT: Positive for congestion, rhinorrhea, sinus  pressure and sinus pain. Negative for sore throat and trouble swallowing.   Respiratory: Positive for cough. Negative for chest tightness, shortness of breath and wheezing.   Cardiovascular: Negative for chest pain.  Gastrointestinal: Negative for abdominal pain ("Soreness" to the area of a recent abdominal hernia surgery), diarrhea, nausea and vomiting.  Genitourinary: Negative for decreased urine volume, difficulty urinating, dysuria and flank pain.  Musculoskeletal: Negative for arthralgias and myalgias.  Skin: Negative for rash.  Neurological: Negative for dizziness, weakness, numbness and headaches.  Hematological: Negative for adenopathy.    Physical Exam Updated Vital Signs BP 125/87 (BP Location: Right Arm)   Pulse 86   Temp 98.4 F (36.9 C) (Oral)   Resp 14   Ht 5\' 6"  (1.676 m)   Wt 78 kg   LMP 08/24/2019   SpO2 100%   BMI 27.76 kg/m   Physical Exam Vitals and nursing note reviewed.   Constitutional:      Appearance: Normal appearance. She is not ill-appearing or toxic-appearing.  HENT:     Right Ear: Tympanic membrane and ear canal normal.     Left Ear: Tympanic membrane and ear canal normal.     Nose: Congestion present. No rhinorrhea.     Mouth/Throat:     Mouth: Mucous membranes are moist.     Pharynx: No posterior oropharyngeal erythema.  Cardiovascular:     Rate and Rhythm: Normal rate and regular rhythm.     Pulses: Normal pulses.  Pulmonary:     Effort: Pulmonary effort is normal.     Breath sounds: Normal breath sounds.  Chest:     Chest wall: No tenderness.  Abdominal:     General: There is no distension.     Palpations: Abdomen is soft.     Tenderness: There is no abdominal tenderness.     Comments: Surgical incision at the umbilicus.  Appears to be healing well.  No erythema, edema or drainage.  No wound dehiscence  Musculoskeletal:        General: Normal range of motion.     Cervical back: Normal range of motion.  Lymphadenopathy:     Cervical: No cervical adenopathy.  Skin:    General: Skin is warm.     Capillary Refill: Capillary refill takes less than 2 seconds.     Findings: No rash.  Neurological:     General: No focal deficit present.     Sensory: No sensory deficit.     Motor: No weakness.     ED Results / Procedures / Treatments   Labs (all labs ordered are listed, but only abnormal results are displayed) Labs Reviewed - No data to display  EKG None  Radiology No results found.  Procedures Procedures (including critical care time)  Medications Ordered in ED Medications - No data to display  ED Course  I have reviewed the triage vital signs and the nursing notes.  Pertinent labs & imaging results that were available during my care of the patient were reviewed by me and considered in my medical decision making (see chart for details).    MDM Rules/Calculators/A&P                      Pt here with cough, nasal  congestion and rhinorrhea.  Pt's daughter also here with similar sx's.  She had a neg covid test reported 09/07/19.  Sx's likely viral.  Vitals reassuring.    Final Clinical Impression(s) / ED Diagnoses Final diagnoses:  Viral  URI with cough    Rx / DC Orders ED Discharge Orders    None       Pauline Aus, PA-C 09/09/19 1123    Vanetta Mulders, MD 09/11/19 1223

## 2020-02-16 ENCOUNTER — Other Ambulatory Visit: Payer: Self-pay

## 2020-02-16 ENCOUNTER — Emergency Department (HOSPITAL_COMMUNITY)
Admission: EM | Admit: 2020-02-16 | Discharge: 2020-02-17 | Disposition: A | Discharge status: Home / Self Care | Payer: Medicaid Other | Attending: Emergency Medicine | Admitting: Emergency Medicine

## 2020-02-16 ENCOUNTER — Encounter (HOSPITAL_COMMUNITY): Payer: Self-pay

## 2020-02-16 ENCOUNTER — Emergency Department (HOSPITAL_COMMUNITY): Payer: Medicaid Other

## 2020-02-16 DIAGNOSIS — T50B95A Adverse effect of other viral vaccines, initial encounter: Secondary | ICD-10-CM | POA: Diagnosis not present

## 2020-02-16 DIAGNOSIS — R5383 Other fatigue: Secondary | ICD-10-CM | POA: Insufficient documentation

## 2020-02-16 DIAGNOSIS — Z20822 Contact with and (suspected) exposure to covid-19: Secondary | ICD-10-CM | POA: Insufficient documentation

## 2020-02-16 DIAGNOSIS — Z79899 Other long term (current) drug therapy: Secondary | ICD-10-CM | POA: Insufficient documentation

## 2020-02-16 DIAGNOSIS — F17210 Nicotine dependence, cigarettes, uncomplicated: Secondary | ICD-10-CM | POA: Insufficient documentation

## 2020-02-16 DIAGNOSIS — T50Z95A Adverse effect of other vaccines and biological substances, initial encounter: Secondary | ICD-10-CM

## 2020-02-16 DIAGNOSIS — J45909 Unspecified asthma, uncomplicated: Secondary | ICD-10-CM | POA: Insufficient documentation

## 2020-02-16 DIAGNOSIS — R079 Chest pain, unspecified: Secondary | ICD-10-CM | POA: Insufficient documentation

## 2020-02-16 DIAGNOSIS — F419 Anxiety disorder, unspecified: Secondary | ICD-10-CM | POA: Insufficient documentation

## 2020-02-16 DIAGNOSIS — M791 Myalgia, unspecified site: Secondary | ICD-10-CM | POA: Diagnosis not present

## 2020-02-16 DIAGNOSIS — R0789 Other chest pain: Secondary | ICD-10-CM

## 2020-02-16 LAB — BASIC METABOLIC PANEL
Anion gap: 17 — ABNORMAL HIGH (ref 5–15)
BUN: <5 mg/dL — ABNORMAL LOW (ref 6–20)
CO2: 23 mmol/L (ref 22–32)
Calcium: 9.1 mg/dL (ref 8.9–10.3)
Chloride: 99 mmol/L (ref 98–111)
Creatinine, Ser: 0.49 mg/dL (ref 0.44–1.00)
GFR calc non Af Amer: >60 mL/min (ref >60)
Glucose, Bld: 95 mg/dL (ref 70–99)
Potassium: 3.1 mmol/L — ABNORMAL LOW (ref 3.5–5.1)
Sodium: 139 mmol/L (ref 135–145)

## 2020-02-16 LAB — URINALYSIS, ROUTINE W REFLEX MICROSCOPIC
Bilirubin Urine: NEGATIVE
Glucose, UA: NEGATIVE mg/dL
Hgb urine dipstick: NEGATIVE
Ketones, ur: NEGATIVE mg/dL
Leukocytes,Ua: NEGATIVE
Nitrite: NEGATIVE
Protein, ur: 30 mg/dL — AB
Specific Gravity, Urine: 1.014 (ref 1.005–1.030)
pH: 6 (ref 5.0–8.0)

## 2020-02-16 LAB — CBC
HCT: 38 % (ref 36.0–46.0)
Hemoglobin: 12.8 g/dL (ref 12.0–15.0)
MCH: 29 pg (ref 26.0–34.0)
MCHC: 33.7 g/dL (ref 30.0–36.0)
MCV: 86 fL (ref 80.0–100.0)
Platelets: 124 10*3/uL — ABNORMAL LOW (ref 150–400)
RBC: 4.42 MIL/uL (ref 3.87–5.11)
RDW: 14.9 % (ref 11.5–15.5)
WBC: 5 10*3/uL (ref 4.0–10.5)
nRBC: 0 % (ref 0.0–0.2)

## 2020-02-16 LAB — D-DIMER, QUANTITATIVE: D-Dimer, Quant: 0.33 ug/mL-FEU (ref 0.00–0.50)

## 2020-02-16 LAB — TROPONIN I (HIGH SENSITIVITY)
Troponin I (High Sensitivity): <2 ng/L (ref <18)
Troponin I (High Sensitivity): <2 ng/L (ref <18)

## 2020-02-16 LAB — PREGNANCY, URINE: Preg Test, Ur: NEGATIVE

## 2020-02-16 MED ORDER — ALBUTEROL SULFATE HFA 108 (90 BASE) MCG/ACT IN AERS
2.0000 | INHALATION_SPRAY | Freq: Once | RESPIRATORY_TRACT | Status: AC
  Administered 2020-02-16: 2 via RESPIRATORY_TRACT
  Filled 2020-02-16: qty 6.7

## 2020-02-16 MED ORDER — DEXAMETHASONE SODIUM PHOSPHATE 10 MG/ML IJ SOLN
10.0000 mg | Freq: Once | INTRAMUSCULAR | Status: AC
  Administered 2020-02-16: 10 mg via INTRAVENOUS
  Filled 2020-02-16: qty 1

## 2020-02-16 MED ORDER — AEROCHAMBER PLUS FLO-VU MEDIUM MISC
1.0000 | Freq: Once | Status: AC
  Administered 2020-02-16: 1
  Filled 2020-02-16 (×2): qty 1

## 2020-02-16 NOTE — ED Notes (Signed)
Second covid vaccine on Monday   Cig abuse 1/2 PD  Complaint of midsternal CP   Pt is very anxious   Last ibuprofen 1800  Reassurance  Active listening   Pt awaiting eval

## 2020-02-16 NOTE — ED Triage Notes (Signed)
Pt to er, pt states that she is here for chest pain, states that her anxiety also gives her chest pain, but this time she broke out into a sweat with her chest pain.  Pt states that she also got the covid vaccine, states that since then she has had fatigue, body aches and cough

## 2020-02-17 LAB — RESPIRATORY PANEL BY RT PCR (FLU A&B, COVID)
Influenza A by PCR: NEGATIVE
Influenza B by PCR: NEGATIVE
SARS Coronavirus 2 by RT PCR: NEGATIVE

## 2020-02-17 NOTE — ED Provider Notes (Signed)
[facility] EMERGENCY DEPARTMENT Provider Note   CSN: [id number] Arrival date & time: 02/16/20  1906     History Chief Complaint  Patient presents with   Chest Pain   Anxiety    Brittany Payne is a 29 y.o. female.  Pt presents to the ED today with cp and fatigue and body aches.  Pt did get her 2nd Covid vaccine on 10/4.  She has felt bad since then.  She is worried the vaccine gave her Covid.  She is very anxious.        Past Medical History:  Diagnosis Date   Asthma    Bipolar 1 disorder (HCC)    Depression     There are no problems to display for this patient.   Past Surgical History:  Procedure Laterality Date   CHOLECYSTECTOMY     HERNIA REPAIR     MYRINGOTOMY       OB History    Gravida  3   Para  2   Term  2   Preterm      AB      Living  2     SAB      TAB      Ectopic      Multiple      Live Births              Family History  Problem Relation Age of Onset   Hypertension Mother    Diabetes Father    Hypertension Father    Cancer Father    Diabetes Other     Social History   Tobacco Use   Smoking status: Current Every Day Smoker    Packs/day: 0.50    Years: 8.00    Pack years: 4.00    Types: Cigarettes    Last attempt to quit: 12/21/2015    Years since quitting: 4.1   Smokeless tobacco: Never Used  Substance Use Topics   Alcohol use: No   Drug use: Not Currently    Types: Marijuana    Home Medications Prior to Admission medications   Medication Sig Start Date End Date Taking? Authorizing Provider  acetaminophen (TYLENOL) 325 MG tablet Take 650 mg by mouth every 6 (six) hours as needed for mild pain.     [provider]  albuterol (VENTOLIN HFA) 108 (90 Base) MCG/ACT inhaler Inhale 2 puffs into the lungs every 4 (four) hours as needed for wheezing or shortness of breath.    [provider]  cetirizine (ZYRTEC) 10 MG tablet Take 1 tablet (10 mg total) by mouth daily. 09/07/19    Triplett, Tammy, PA-C  promethazine-dextromethorphan (PROMETHAZINE-DM) 6.25-15 MG/5ML syrup Take 5 mLs by mouth 4 (four) times daily as needed. May cause drowsiness 09/07/19   Triplett, Tammy, PA-C  sertraline (ZOLOFT) 50 MG tablet Take 1 tablet by mouth daily.    [provider]    Allergies    Yellow jacket venom, Amoxicillin, Celexa [citalopram], Ketorolac tromethamine, Penicillins, Tramadol, and Vistaril [hydroxyzine hcl]  Review of Systems   Review of Systems  Constitutional: Positive for fatigue and fever.  Cardiovascular: Positive for chest pain.  Psychiatric/Behavioral: The patient is nervous/anxious.   All other systems reviewed and are negative.   Physical Exam Updated Vital Signs BP 122/77    Pulse 84    Temp 98.2 F (36.8 C)    Resp 16    Ht 5\' 3"  (1.6 m)    Wt 81.6 kg    LMP 07/24/2019  Comment: 02/16/20 - pt states had tubal ligation x 7 months ago. Pt shielded for CXR 02/16/20.   SpO2 99%    BMI 31.89 kg/m   Physical Exam Vitals and nursing note reviewed.  Constitutional:      Appearance: She is well-developed.  HENT:     Head: Normocephalic and atraumatic.  Eyes:     Extraocular Movements: Extraocular movements intact.     Pupils: Pupils are equal, round, and reactive to light.  Cardiovascular:     Rate and Rhythm: Normal rate and regular rhythm.     Heart sounds: Normal heart sounds.  Pulmonary:     Effort: Pulmonary effort is normal.     Breath sounds: Normal breath sounds.  Abdominal:     General: Bowel sounds are normal.     Palpations: Abdomen is soft.  Musculoskeletal:        General: Normal range of motion.     Cervical back: Normal range of motion and neck supple.  Lymphadenopathy:     Cervical: Cervical adenopathy present.  Skin:    General: Skin is warm.     Capillary Refill: Capillary refill takes less than 2 seconds.  Neurological:     General: No focal deficit present.     Mental Status: She is alert and oriented to person, place,  and time.  Psychiatric:        Mood and Affect: Mood is anxious.     ED Results / Procedures / Treatments   Labs (all labs ordered are listed, but only abnormal results are displayed) Labs Reviewed  BASIC METABOLIC PANEL - Abnormal; Notable for the following components:      Result Value   Potassium 3.1 (*)    BUN <5 (*)    Anion gap 17 (*)    All other components within normal limits  CBC - Abnormal; Notable for the following components:   Platelets 124 (*)    All other components within normal limits  URINALYSIS, ROUTINE W REFLEX MICROSCOPIC - Abnormal; Notable for the following components:   APPearance HAZY (*)    Protein, ur 30 (*)    Bacteria, UA RARE (*)    All other components within normal limits  RESPIRATORY PANEL BY RT PCR (FLU A&B, COVID)  PREGNANCY, URINE  D-DIMER, QUANTITATIVE (NOT AT [facility])  TROPONIN I (HIGH SENSITIVITY)  TROPONIN I (HIGH SENSITIVITY)    EKG EKG Interpretation  Date/Time:  Wednesday February 16 2020 19:16:44 EDT Ventricular Rate:  89 PR Interval:  150 QRS Duration: 74 QT Interval:  352 QTC Calculation: 428 R Axis:   -11 Text Interpretation: Normal sinus rhythm with sinus arrhythmia Normal ECG No significant change since last tracing Confirmed by Jacalyn Lefevre [phone]) on 02/16/2020 11:27:24 PM   Radiology DG Chest 2 View  Result Date: 02/16/2020 CLINICAL DATA:  Chest pain and shortness of breath EXAM: CHEST - 2 VIEW COMPARISON:  09/06/2018 FINDINGS: The heart size and mediastinal contours are within normal limits. Both lungs are clear. The visualized skeletal structures are unremarkable. IMPRESSION: No active cardiopulmonary disease. Electronically Signed   By: Alcide Clever M.D.   On: 02/16/2020 19:51    Procedures Procedures (including critical care time)  Medications Ordered in ED Medications  dexamethasone (DECADRON) injection 10 mg (10 mg Intravenous Given 02/16/20 2349)  albuterol (VENTOLIN HFA) 108 (90 Base) MCG/ACT inhaler 2  puff (2 puffs Inhalation Given 02/16/20 2350)  AeroChamber Plus Flo-Vu Medium MISC 1 each (1 each Other Given 02/16/20 2350)  ED Course  I have reviewed the triage vital signs and the nursing notes.  Pertinent labs & imaging results that were available during my care of the patient were reviewed by me and considered in my medical decision making (see chart for details).    MDM Rules/Calculators/A&P                            Covid and flu tests were negative.  CXR/EKG/Ddimer neg.  Pt's sx are likely a normal immune response to getting the Covid vaccine.  Pt is stable for d/c.  Return if worse.  Brittany Payne was evaluated in Emergency Department on 02/17/2020 for the symptoms described in the history of present illness. She was evaluated in the context of the global COVID-19 pandemic, which necessitated consideration that the patient might be at risk for infection with the SARS-CoV-2 virus that causes COVID-19. Institutional protocols and algorithms that pertain to the evaluation of patients at risk for COVID-19 are in a state of rapid change based on information released by regulatory bodies including the CDC and federal and state organizations. These policies and algorithms were followed during the patient's care in the ED. Final Clinical Impression(s) / ED Diagnoses Final diagnoses:  Vaccine reaction, initial encounter  Atypical chest pain    Rx / DC Orders ED Discharge Orders    None       Jacalyn Lefevre, MD 02/17/20 [phone]

## 2020-03-06 ENCOUNTER — Other Ambulatory Visit: Payer: Self-pay

## 2020-03-06 ENCOUNTER — Emergency Department (HOSPITAL_COMMUNITY)
Admission: EM | Admit: 2020-03-06 | Discharge: 2020-03-06 | Disposition: A | Discharge status: Home / Self Care | Payer: Medicaid Other | Attending: Emergency Medicine | Admitting: Emergency Medicine

## 2020-03-06 ENCOUNTER — Encounter (HOSPITAL_COMMUNITY): Payer: Self-pay | Admitting: Emergency Medicine

## 2020-03-06 DIAGNOSIS — W57XXXA Bitten or stung by nonvenomous insect and other nonvenomous arthropods, initial encounter: Secondary | ICD-10-CM | POA: Diagnosis not present

## 2020-03-06 DIAGNOSIS — J45909 Unspecified asthma, uncomplicated: Secondary | ICD-10-CM | POA: Insufficient documentation

## 2020-03-06 DIAGNOSIS — F17210 Nicotine dependence, cigarettes, uncomplicated: Secondary | ICD-10-CM | POA: Diagnosis not present

## 2020-03-06 DIAGNOSIS — S80861A Insect bite (nonvenomous), right lower leg, initial encounter: Secondary | ICD-10-CM | POA: Insufficient documentation

## 2020-03-06 DIAGNOSIS — R110 Nausea: Secondary | ICD-10-CM | POA: Insufficient documentation

## 2020-03-06 DIAGNOSIS — L03115 Cellulitis of right lower limb: Secondary | ICD-10-CM | POA: Diagnosis not present

## 2020-03-06 DIAGNOSIS — M79661 Pain in right lower leg: Secondary | ICD-10-CM | POA: Diagnosis present

## 2020-03-06 MED ORDER — SULFAMETHOXAZOLE-TRIMETHOPRIM 800-160 MG PO TABS
1.0000 | ORAL_TABLET | Freq: Once | ORAL | Status: AC
  Administered 2020-03-06: 1 via ORAL
  Filled 2020-03-06: qty 1

## 2020-03-06 MED ORDER — ACETAMINOPHEN 325 MG PO TABS
650.0000 mg | ORAL_TABLET | Freq: Once | ORAL | Status: AC
  Administered 2020-03-06: 650 mg via ORAL
  Filled 2020-03-06: qty 2

## 2020-03-06 MED ORDER — SULFAMETHOXAZOLE-TRIMETHOPRIM 800-160 MG PO TABS: 1.0000 | ORAL_TABLET | Freq: Two times a day (BID) | ORAL | 0 refills | Status: AC

## 2020-03-06 NOTE — ED Triage Notes (Signed)
Pt c/o rash/insect bite to the right lower leg today around 1600. Pt also c/o nausea.

## 2020-03-06 NOTE — ED Provider Notes (Signed)
[facility] EMERGENCY DEPARTMENT Provider Note   CSN: [id number] Arrival date & time: 03/06/20  1956     History Chief Complaint  Patient presents with  . Insect Bite    Brittany Payne is a 29 y.o. female.  Pt presents to the ED today with an insect bite to her right lower leg.  It is now red and painful.  Pt has not taken any meds for sx.        Past Medical History:  Diagnosis Date  . Asthma   . Bipolar 1 disorder (HCC)   . Depression     There are no problems to display for this patient.   Past Surgical History:  Procedure Laterality Date  . CHOLECYSTECTOMY    . HERNIA REPAIR    . MYRINGOTOMY    . TUBAL LIGATION       OB History    Gravida  3   Para  2   Term  2   Preterm      AB      Living  2     SAB      TAB      Ectopic      Multiple      Live Births              Family History  Problem Relation Age of Onset  . Hypertension Mother   . Diabetes Father   . Hypertension Father   . Cancer Father   . Diabetes Other     Social History   Tobacco Use  . Smoking status: Current Every Day Smoker    Packs/day: 0.50    Years: 8.00    Pack years: 4.00    Types: Cigarettes    Last attempt to quit: 12/21/2015    Years since quitting: 4.2  . Smokeless tobacco: Never Used  Substance Use Topics  . Alcohol use: No  . Drug use: Not Currently    Types: Marijuana    Home Medications Prior to Admission medications   Medication Sig Start Date End Date Taking? Authorizing Provider  acetaminophen (TYLENOL) 325 MG tablet Take 650 mg by mouth every 6 (six) hours as needed for mild pain.    Yes [provider]  albuterol (VENTOLIN HFA) 108 (90 Base) MCG/ACT inhaler Inhale 2 puffs into the lungs every 4 (four) hours as needed for wheezing or shortness of breath.   Yes [provider]  sertraline (ZOLOFT) 50 MG tablet Take 1 tablet by mouth daily.   Yes [provider]  cetirizine (ZYRTEC) 10 MG tablet Take 1  tablet (10 mg total) by mouth daily. Patient not taking: Reported on 03/06/2020 09/07/19   Pauline Aus, PA-C  promethazine-dextromethorphan (PROMETHAZINE-DM) 6.25-15 MG/5ML syrup Take 5 mLs by mouth 4 (four) times daily as needed. May cause drowsiness Patient not taking: Reported on 03/06/2020 09/07/19   Triplett, Tammy, PA-C  sulfamethoxazole-trimethoprim (BACTRIM DS) 800-160 MG tablet Take 1 tablet by mouth 2 (two) times daily for 7 days. 03/06/20 03/13/20  Jacalyn Lefevre, MD    Allergies    Yellow jacket venom, Amoxicillin, Celexa [citalopram], Ketorolac tromethamine, Penicillins, Tramadol, and Vistaril [hydroxyzine hcl]  Review of Systems   Review of Systems  Skin: Positive for rash.  All other systems reviewed and are negative.   Physical Exam Updated Vital Signs BP (!) 137/93   Pulse 83   Temp 98 F (36.7 C)   Resp 18   Ht 5\' 3"  (1.6 m)  Wt 83.5 kg   LMP 02/20/2020   SpO2 100%   BMI 32.59 kg/m   Physical Exam Vitals and nursing note reviewed.  Constitutional:      Appearance: Normal appearance.  HENT:     Head: Normocephalic and atraumatic.     Right Ear: External ear normal.     Left Ear: External ear normal.     Nose: Nose normal.     Mouth/Throat:     Mouth: Mucous membranes are moist.     Pharynx: Oropharynx is clear.  Eyes:     Extraocular Movements: Extraocular movements intact.     Conjunctiva/sclera: Conjunctivae normal.     Pupils: Pupils are equal, round, and reactive to light.  Cardiovascular:     Rate and Rhythm: Normal rate and regular rhythm.     Pulses: Normal pulses.     Heart sounds: Normal heart sounds.  Pulmonary:     Effort: Pulmonary effort is normal.     Breath sounds: Normal breath sounds.  Abdominal:     General: Abdomen is flat. Bowel sounds are normal.     Palpations: Abdomen is soft.  Musculoskeletal:        General: Normal range of motion.     Cervical back: Normal range of motion and neck supple.  Skin:    Capillary  Refill: Capillary refill takes less than 2 seconds.     Comments: Cellulitis RLE.  No abscess.   Neurological:     General: No focal deficit present.     Mental Status: She is alert and oriented to person, place, and time.     ED Results / Procedures / Treatments   Labs (all labs ordered are listed, but only abnormal results are displayed) Labs Reviewed - No data to display  EKG None  Radiology No results found.  Procedures Procedures (including critical care time)  Medications Ordered in ED Medications  sulfamethoxazole-trimethoprim (BACTRIM DS) 800-160 MG per tablet 1 tablet (1 tablet Oral Given 03/06/20 2249)  acetaminophen (TYLENOL) tablet 650 mg (650 mg Oral Given 03/06/20 2249)    ED Course  I have reviewed the triage vital signs and the nursing notes.  Pertinent labs & imaging results that were available during my care of the patient were reviewed by me and considered in my medical decision making (see chart for details).    MDM Rules/Calculators/A&P                          Pt will be placed on bactrim and is given her first dose here.  Pt is stable for d/c.  Return if worse. Final Clinical Impression(s) / ED Diagnoses Final diagnoses:  Insect bite of right lower leg, initial encounter  Cellulitis of right lower extremity    Rx / DC Orders ED Discharge Orders         Ordered    sulfamethoxazole-trimethoprim (BACTRIM DS) 800-160 MG tablet  2 times daily        03/06/20 2241           Jacalyn Lefevre, MD 03/06/20 2327

## 2020-03-16 ENCOUNTER — Other Ambulatory Visit: Payer: Self-pay

## 2020-03-16 ENCOUNTER — Encounter: Payer: Self-pay | Admitting: Emergency Medicine

## 2020-03-16 ENCOUNTER — Ambulatory Visit
Admission: EM | Admit: 2020-03-16 | Discharge: 2020-03-16 | Disposition: A | Discharge status: Home / Self Care | Payer: Medicaid Other | Attending: Emergency Medicine | Admitting: Emergency Medicine

## 2020-03-16 DIAGNOSIS — H66001 Acute suppurative otitis media without spontaneous rupture of ear drum, right ear: Secondary | ICD-10-CM | POA: Diagnosis not present

## 2020-03-16 MED ORDER — AZITHROMYCIN 250 MG PO TABS: 250.0000 mg | ORAL_TABLET | Freq: Every day | ORAL | 0 refills | Status: DC

## 2020-03-16 NOTE — ED Provider Notes (Signed)
[facility]   [id number] 03/16/20 Arrival Time: 1553  CC:EAR PAIN  SUBJECTIVE: History from: patient.  Brittany Payne is a 29 y.o. female who presents with RT ear pain and sore throat x 1 day.  Reports standing out in the wind prior to symptoms.  Denies alleviating or aggravating factors.  Reports similar symptoms in the past with ear ache.  Denies fever, chills, fatigue, sinus pain, rhinorrhea, ear discharge, SOB, wheezing, chest pain, nausea, changes in bowel or bladder habits.    ROS: As per HPI.  All other pertinent ROS negative.     Past Medical History:  Diagnosis Date   Asthma    Bipolar 1 disorder (HCC)    Depression    Past Surgical History:  Procedure Laterality Date   CHOLECYSTECTOMY     HERNIA REPAIR     MYRINGOTOMY     TUBAL LIGATION     Allergies  Allergen Reactions   Yellow Jacket Venom Anaphylaxis and Swelling   Amoxicillin Nausea And Vomiting   Celexa [Citalopram] Other (See Comments)    Patient states "makes me go crazy".   Ketorolac Tromethamine Nausea And Vomiting   Penicillins Diarrhea, Itching and Nausea And Vomiting    Has patient had a PCN reaction causing immediate rash, facial/tongue/throat swelling, SOB or lightheadedness with hypotension: Yes Has patient had a PCN reaction causing severe rash involving mucus membranes or skin necrosis: Yes Has patient had a PCN reaction that required hospitalization: Yes Has patient had a PCN reaction occurring within the last 10 years: No If all of the above answers are "NO", then may proceed with Cephalosporin use.    Tramadol Nausea And Vomiting   Vistaril [Hydroxyzine Hcl] Hives and Itching   No current facility-administered medications on file prior to encounter.   Current Outpatient Medications on File Prior to Encounter  Medication Sig Dispense Refill   acetaminophen (TYLENOL) 325 MG tablet Take 650 mg by mouth every 6 (six) hours as needed for mild pain.      albuterol  (VENTOLIN HFA) 108 (90 Base) MCG/ACT inhaler Inhale 2 puffs into the lungs every 4 (four) hours as needed for wheezing or shortness of breath.     sertraline (ZOLOFT) 50 MG tablet Take 1 tablet by mouth daily.     [DISCONTINUED] cetirizine (ZYRTEC) 10 MG tablet Take 1 tablet (10 mg total) by mouth daily. (Patient not taking: Reported on 03/06/2020) 21 tablet 0   Social History   Socioeconomic History   Marital status: Single    Spouse name: Not on file   Number of children: Not on file   Years of education: Not on file   Highest education level: Not on file  Occupational History   Not on file  Tobacco Use   Smoking status: Current Every Day Smoker    Packs/day: 0.50    Years: 8.00    Pack years: 4.00    Types: Cigarettes    Last attempt to quit: 12/21/2015    Years since quitting: 4.2   Smokeless tobacco: Never Used  Substance and Sexual Activity   Alcohol use: No   Drug use: Not Currently    Types: Marijuana   Sexual activity: Yes    Birth control/protection: None  Other Topics Concern   Not on file  Social History Narrative   Not on file   Social Determinants of Health   Financial Resource Strain:    Difficulty of Paying Living Expenses: Not on file  Food Insecurity:  Worried About Programme researcher, broadcasting/film/video in the Last Year: Not on file   [employer] of Food in the Last Year: Not on file  Transportation Needs:    Lack of Transportation (Medical): Not on file   Lack of Transportation (Non-Medical): Not on file  Physical Activity:    Days of Exercise per Week: Not on file   Minutes of Exercise per Session: Not on file  Stress:    Feeling of Stress : Not on file  Social Connections:    Frequency of Communication with Friends and Family: Not on file   Frequency of Social Gatherings with Friends and Family: Not on file   Attends Religious Services: Not on file   Active Member of Clubs or Organizations: Not on file   Attends Banker  Meetings: Not on file   Marital Status: Not on file  Intimate Partner Violence:    Fear of Current or Ex-Partner: Not on file   Emotionally Abused: Not on file   Physically Abused: Not on file   Sexually Abused: Not on file   Family History  Problem Relation Age of Onset   Hypertension Mother    Diabetes Father    Hypertension Father    Cancer Father    Diabetes Other     OBJECTIVE:  Vitals:   03/16/20 1606  BP: 119/87  Pulse: 80  Resp: 17  Temp: 98 F (36.7 C)  TempSrc: Tympanic  SpO2: 98%    General appearance: alert; well-appearing, nontoxic; speaking in full sentences and tolerating own secretions HEENT: NCAT; Ears: EACs clear, LT TM pearly gray, TM tense; Eyes: PERRL.  EOM grossly intact.Nose: nares patent without rhinorrhea, Throat: oropharynx clear, tonsils non erythematous or enlarged, uvula midline  Neck: supple without LAD Lungs: unlabored respirations, symmetrical air entry; cough: absent; no respiratory distress; CTAB Heart: regular rate and rhythm.  Skin: warm and dry Psychological: alert and cooperative; normal mood and affect    ASSESSMENT & PLAN:  1. Non-recurrent acute suppurative otitis media of right ear without spontaneous rupture of tympanic membrane     Meds ordered this encounter  Medications   azithromycin (ZITHROMAX) 250 MG tablet    Sig: Take 1 tablet (250 mg total) by mouth daily. Take first 2 tablets together, then 1 every day until finished.    Dispense:  6 tablet    Refill:  0    Order Specific Question:   Supervising Provider    Answer:   Eustace Moore [[id number]]    Rest and drink plenty of fluids Prescribed z-pak Take medications as directed and to completion Continue to use OTC ibuprofen and/ or tylenol as needed for pain control Follow up with PCP if symptoms persists Return here or go to the ER if you have any new or worsening symptoms   Reviewed expectations re: course of current medical issues. Questions  answered. Outlined signs and symptoms indicating need for more acute intervention. Patient verbalized understanding. After Visit Summary given.         Rennis Harding, PA-C 03/16/20 [phone]

## 2020-03-16 NOTE — Discharge Instructions (Signed)
Rest and drink plenty of fluids Prescribed z-pak Take medications as directed and to completion Continue to use OTC ibuprofen and/ or tylenol as needed for pain control Follow up with PCP if symptoms persists Return here or go to the ER if you have any new or worsening symptoms

## 2020-03-16 NOTE — ED Triage Notes (Signed)
RT ear pain and sore throat started yesterday

## 2020-03-17 ENCOUNTER — Telehealth: Payer: Medicaid Other | Admitting: Physician Assistant

## 2020-03-17 DIAGNOSIS — H9209 Otalgia, unspecified ear: Secondary | ICD-10-CM

## 2020-03-17 DIAGNOSIS — J029 Acute pharyngitis, unspecified: Secondary | ICD-10-CM

## 2020-03-17 NOTE — Progress Notes (Signed)
Based on what you shared with me, I feel your condition warrants further evaluation and I recommend that you be seen for a face to face office visit.  I know you were recently seen at an urgent care for this, but I have significant concern about worsening swelling of your throat and ear.  Swimmer's ear and progress to much more severe diseases therefore it is best that you have another face to face evaluation to determine if there is more that needs to be done.   NOTE: If you entered your credit card information for this eVisit, you will not be charged. You may see a "hold" on your card for the $35 but that hold will drop off and you will not have a charge processed.   If you are having a true medical emergency please call 911.      For an urgent face to face visit, [facility] has five urgent care centers for your convenience:     [facility] at [facility] Directions [phone] [address] [place], [place] [address] . 10 am - 6pm Monday - Friday    [facility]) Get Driving Directions [phone] [address] [place], [place] [address] . 10 am to 8 pm Monday-Friday . 12 pm to 8 pm [facility] at [facility] Get Driving Directions [phone] [address], [address] [place], [place] [address] . 8 am to 8 pm Monday-Friday . 9 am to 6 pm Saturday . 11 am to 6 pm Sunday     [facility] at [facility] Get Driving Directions  [phone] [address] [place], [place] [address] . 8 am to 8 pm Monday-Friday . 8 am to 4 pm [facility] at [facility] Directions [phone] [address] [place], [place] [address] . 12 pm to 6 pm Monday-Friday      Your e-visit answers were reviewed by a board certified advanced clinical practitioner to complete your personal care plan.   Thank you for using e-Visits.   Greater than 5 minutes, yet less than 10 minutes of time have been spent researching, coordinating, and implementing care for this patient today

## 2020-05-12 ENCOUNTER — Emergency Department (HOSPITAL_COMMUNITY): Payer: Medicaid Other

## 2020-05-12 ENCOUNTER — Encounter (HOSPITAL_COMMUNITY): Payer: Self-pay | Admitting: Emergency Medicine

## 2020-05-12 ENCOUNTER — Other Ambulatory Visit: Payer: Self-pay

## 2020-05-12 ENCOUNTER — Emergency Department (HOSPITAL_COMMUNITY)
Admission: EM | Admit: 2020-05-12 | Discharge: 2020-05-12 | Disposition: A | Discharge status: Home / Self Care | Payer: Medicaid Other | Attending: Emergency Medicine | Admitting: Emergency Medicine

## 2020-05-12 DIAGNOSIS — W230XXA Caught, crushed, jammed, or pinched between moving objects, initial encounter: Secondary | ICD-10-CM | POA: Insufficient documentation

## 2020-05-12 DIAGNOSIS — M79675 Pain in left toe(s): Secondary | ICD-10-CM

## 2020-05-12 DIAGNOSIS — S99922A Unspecified injury of left foot, initial encounter: Secondary | ICD-10-CM | POA: Diagnosis present

## 2020-05-12 DIAGNOSIS — J45909 Unspecified asthma, uncomplicated: Secondary | ICD-10-CM | POA: Insufficient documentation

## 2020-05-12 DIAGNOSIS — S90122A Contusion of left lesser toe(s) without damage to nail, initial encounter: Secondary | ICD-10-CM | POA: Insufficient documentation

## 2020-05-12 DIAGNOSIS — F17210 Nicotine dependence, cigarettes, uncomplicated: Secondary | ICD-10-CM | POA: Diagnosis not present

## 2020-05-12 NOTE — ED Triage Notes (Signed)
Pt hit her LT great toe on some bricks last night. C/o pain to great toe and top of foot.

## 2020-05-12 NOTE — ED Provider Notes (Signed)
[facility] EMERGENCY DEPARTMENT Provider Note   CSN: [id number] Arrival date & time: 05/12/20  1420     History Chief Complaint  Patient presents with  . Foot Injury    Brittany Payne is a 29 y.o. female with PMHx asthma, depression, and bipolar disorder who presents to the ED today with complaint of sudden onset, constant, worsening, throbbing, left great toe pain s/p jamming it last night. Pt reports she was walking to the couch around 2 AM last night and jammed her toe against bricks that are underneath the couch. Pt had immediate pain to her toe that has been worsening since. She has been unable to ambulate on it without severe pain. She has been taking Tylenol and Ibuprofen with minimal relief. She has also been applying ice and elevating it when she can. She states the pain is shooting up her LLE to her knee. No head injury or LOC. No previous injury to the foot. Denies weakness, numbness, tingling, wound, fevers, chills, or any other associated symptoms.   The history is provided by the patient and medical records.       Past Medical History:  Diagnosis Date  . Asthma   . Bipolar 1 disorder (HCC)   . Depression     There are no problems to display for this patient.   Past Surgical History:  Procedure Laterality Date  . CHOLECYSTECTOMY    . HERNIA REPAIR    . MYRINGOTOMY    . TUBAL LIGATION       OB History    Gravida  3   Para  2   Term  2   Preterm      AB      Living  2     SAB      IAB      Ectopic      Multiple      Live Births              Family History  Problem Relation Age of Onset  . Hypertension Mother   . Diabetes Father   . Hypertension Father   . Cancer Father   . Diabetes Other     Social History   Tobacco Use  . Smoking status: Current Every Day Smoker    Packs/day: 0.50    Years: 8.00    Pack years: 4.00    Types: Cigarettes    Last attempt to quit: 12/21/2015    Years since quitting: 4.3  . Smokeless  tobacco: Never Used  Substance Use Topics  . Alcohol use: No  . Drug use: Not Currently    Types: Marijuana    Home Medications Prior to Admission medications   Medication Sig Start Date End Date Taking? Authorizing Provider  acetaminophen (TYLENOL) 325 MG tablet Take 650 mg by mouth every 6 (six) hours as needed for mild pain.     [provider]  albuterol (VENTOLIN HFA) 108 (90 Base) MCG/ACT inhaler Inhale 2 puffs into the lungs every 4 (four) hours as needed for wheezing or shortness of breath.    [provider]  azithromycin (ZITHROMAX) 250 MG tablet Take 1 tablet (250 mg total) by mouth daily. Take first 2 tablets together, then 1 every day until finished. 03/16/20   Wurst, [place], PA-C  sertraline (ZOLOFT) 50 MG tablet Take 1 tablet by mouth daily.    [provider]  cetirizine (ZYRTEC) 10 MG tablet Take 1 tablet (10 mg total) by mouth daily. Patient  not taking: Reported on 03/06/2020 09/07/19 03/16/20  Pauline Aus, PA-C    Allergies    Yellow jacket venom, Amoxicillin, Celexa [citalopram], Ketorolac tromethamine, Penicillins, and Vistaril [hydroxyzine hcl]  Review of Systems   Review of Systems  Constitutional: Negative for chills and fever.  Musculoskeletal: Positive for arthralgias and joint swelling.  Skin: Positive for color change (ecchymosis). Negative for wound.  Neurological: Negative for weakness and numbness.    Physical Exam Updated Vital Signs BP 97/82 (BP Location: Right Arm)   Pulse 84   Temp 98.4 F (36.9 C) (Oral)   Resp 18   Ht 5\' 3"  (1.6 m)   Wt 83 kg   LMP 05/02/2020   SpO2 100%   BMI 32.41 kg/m   Physical Exam Vitals and nursing note reviewed.  Constitutional:      Appearance: She is not ill-appearing.  HENT:     Head: Normocephalic and atraumatic.  Eyes:     Conjunctiva/sclera: Conjunctivae normal.  Cardiovascular:     Rate and Rhythm: Normal rate and regular rhythm.     Pulses: Normal pulses.   Pulmonary:     Effort: Pulmonary effort is normal.     Breath sounds: Normal breath sounds. No wheezing, rhonchi or rales.  Musculoskeletal:     Comments: Ecchymosis noted to the dorsal aspect of the left great toe proximal to the nail bed with surrounding TTP. ROM limited s/2 pain. Cap refill < 2 seconds and sensation intact. No tenderness to remainder of foot or ankle. 2+ DP pulse.   Skin:    General: Skin is warm and dry.     Coloration: Skin is not jaundiced.  Neurological:     Mental Status: She is alert.     ED Results / Procedures / Treatments   Labs (all labs ordered are listed, but only abnormal results are displayed) Labs Reviewed - No data to display  EKG None  Radiology DG Foot Complete Left  Result Date: 05/12/2020 CLINICAL DATA:  Foot pain bruising near LEFT great toe with radiation of LEFT foot after hitting on brick this morning. EXAM: LEFT FOOT - COMPLETE 3+ VIEW COMPARISON:  None FINDINGS: Soft tissue swelling over the medial aspect of the LEFT great toe about the interphalangeal joint. No evidence of radiopaque foreign body or fracture. No sign of fracture or dislocation elsewhere about the foot. IMPRESSION: Soft tissue swelling over the medial aspect of the LEFT great toe. No signs of fracture or radiopaque foreign body. Electronically Signed   By: 05/14/2020 M.D.   On: 05/12/2020 14:58    Procedures Procedures (including critical care time)  Medications Ordered in ED Medications - No data to display  ED Course  I have reviewed the triage vital signs and the nursing notes.  Pertinent labs & imaging results that were available during my care of the patient were reviewed by me and considered in my medical decision making (see chart for details).    MDM Rules/Calculators/A&P                          29 year old female who presents to the ED today after jamming her left great toe into bricks last night causing immediate pain.  Has been unable to  ambulate due to pain since then prompting her to come to the ED today.  On arrival to the ED vitals are stable.  Patient had an x-ray done which does show some soft tissue swelling over the  medial aspect of the left great toe without signs of fracture or foreign body.  On exam she has tenderness and ecchymosis to the left great toe proximal to the nailbed.  She is neurovascularly intact throughout.  No tenderness to the remainder of the foot or ankle.  Have discussed rice therapy and ibuprofen/Tylenol as needed for pain.  Patient would like crutches at this time given she is having pain with bearing weight onto her foot, will provide.  Patient instructed to follow-up with orthopedics for further evaluation if no improvement.  Patient is in agreement with plan is stable for discharge.   This note was prepared using Dragon voice recognition software and may include unintentional dictation errors due to the inherent limitations of voice recognition software.  Final Clinical Impression(s) / ED Diagnoses Final diagnoses:  Great toe pain, left    Rx / DC Orders ED Discharge Orders    None       Discharge Instructions     Your xray did not show any signs of fractures today. Your pain is likely related to a soft tissue injury of the toe itself.   Please use crutches as needed for the next couple of days given you are having pain with putting weight onto your foot.   While at home please rest, ice, and elevate your foot to help reduce pain/inflammation.   You can take 1,000 mg Tylenol every 8 hours and 800 mg Ibuprofen every 8 hours.   If no improvement in your symptoms please follow up with orthopedist Dr. Romeo Apple for further evaluation.        Tanda Rockers, PA-C 05/12/20 1646    Derwood Kaplan, MD 05/12/20 [phone]

## 2020-05-12 NOTE — Discharge Instructions (Addendum)
Your xray did not show any signs of fractures today. Your pain is likely related to a soft tissue injury of the toe itself.   Please use crutches as needed for the next couple of days given you are having pain with putting weight onto your foot.   While at home please rest, ice, and elevate your foot to help reduce pain/inflammation.   You can take 1,000 mg Tylenol every 8 hours and 800 mg Ibuprofen every 8 hours.   If no improvement in your symptoms please follow up with orthopedist Dr. Romeo Apple for further evaluation.

## 2020-07-10 ENCOUNTER — Other Ambulatory Visit: Payer: Self-pay

## 2020-07-10 ENCOUNTER — Ambulatory Visit
Admission: EM | Admit: 2020-07-10 | Discharge: 2020-07-10 | Disposition: A | Discharge status: Home / Self Care | Payer: Medicaid Other | Attending: Family Medicine | Admitting: Family Medicine

## 2020-07-10 ENCOUNTER — Encounter: Payer: Self-pay | Admitting: Emergency Medicine

## 2020-07-10 DIAGNOSIS — J988 Other specified respiratory disorders: Secondary | ICD-10-CM

## 2020-07-10 DIAGNOSIS — B9789 Other viral agents as the cause of diseases classified elsewhere: Secondary | ICD-10-CM

## 2020-07-10 MED ORDER — IPRATROPIUM BROMIDE 0.03 % NA SOLN: 2.0000 | Freq: Two times a day (BID) | NASAL | 0 refills | Status: DC

## 2020-07-10 MED ORDER — BENZONATATE 100 MG PO CAPS: 200.0000 mg | ORAL_CAPSULE | Freq: Three times a day (TID) | ORAL | 0 refills | Status: DC | PRN

## 2020-07-10 MED ORDER — PREDNISONE 20 MG PO TABS: 40.0000 mg | ORAL_TABLET | Freq: Every day | ORAL | 0 refills | Status: DC

## 2020-07-10 NOTE — ED Provider Notes (Signed)
[facility]    CSN: [id number] Arrival date & time: 07/10/20  1907      History   Chief Complaint Chief Complaint  Patient presents with  . Cough    HPI Brittany Payne is a 30 y.o. female.   HPI Patient presents today for evaluation of cough, nasal congestion, mild shortnesS of breath with wheezing.  Patient endorses a history of bronchitis and has asthma.  She endorses that her children also sick.  She is been taking Robitussin and using her albuterol with no effective relief of symptoms.  She is afebrile.  She had a negative home Covid test.  She is a current daily smoker. Past Medical History:  Diagnosis Date  . Asthma   . Bipolar 1 disorder (HCC)   . Depression     There are no problems to display for this patient.   Past Surgical History:  Procedure Laterality Date  . CHOLECYSTECTOMY    . HERNIA REPAIR    . MYRINGOTOMY    . TUBAL LIGATION      OB History    Gravida  3   Para  2   Term  2   Preterm      AB      Living  2     SAB      IAB      Ectopic      Multiple      Live Births               Home Medications    Prior to Admission medications   Medication Sig Start Date End Date Taking? Authorizing Provider  acetaminophen (TYLENOL) 325 MG tablet Take 650 mg by mouth every 6 (six) hours as needed for mild pain.     [provider]  albuterol (VENTOLIN HFA) 108 (90 Base) MCG/ACT inhaler Inhale 2 puffs into the lungs every 4 (four) hours as needed for wheezing or shortness of breath.    [provider]  azithromycin (ZITHROMAX) 250 MG tablet Take 1 tablet (250 mg total) by mouth daily. Take first 2 tablets together, then 1 every day until finished. 03/16/20   Wurst, [place], PA-C  sertraline (ZOLOFT) 50 MG tablet Take 1 tablet by mouth daily.    [provider]  cetirizine (ZYRTEC) 10 MG tablet Take 1 tablet (10 mg total) by mouth daily. Patient not taking: Reported on 03/06/2020 09/07/19 03/16/20   Pauline Aus, PA-C    Family History Family History  Problem Relation Age of Onset  . Hypertension Mother   . Diabetes Father   . Hypertension Father   . Cancer Father   . Diabetes Other     Social History Social History   Tobacco Use  . Smoking status: Current Every Day Smoker    Packs/day: 0.50    Years: 8.00    Pack years: 4.00    Types: Cigarettes    Last attempt to quit: 12/21/2015    Years since quitting: 4.5  . Smokeless tobacco: Never Used  Substance Use Topics  . Alcohol use: No  . Drug use: Not Currently    Types: Marijuana     Allergies   Yellow jacket venom, Amoxicillin, Celexa [citalopram], Ketorolac tromethamine, Penicillins, and Vistaril [hydroxyzine hcl]   Review of Systems Review of Systems Pertinent negatives listed in HPI   Physical Exam Triage Vital Signs ED Triage Vitals  Enc Vitals Group     BP 07/10/20 1948 128/83     Pulse  Rate 07/10/20 1948 68     Resp 07/10/20 1948 18     Temp 07/10/20 1948 98.6 F (37 C)     Temp Source 07/10/20 1948 Oral     SpO2 07/10/20 1948 98 %     Weight --      Height --      Head Circumference --      Peak Flow --      Pain Score 07/10/20 1944 0     Pain Loc --      Pain Edu? --      Excl. in GC? --    No data found.  Updated Vital Signs BP 128/83 (BP Location: Right Arm)   Pulse 68   Temp 98.6 F (37 C) (Oral)   Resp 18   SpO2 98%   Visual Acuity Right Eye Distance:   Left Eye Distance:   Bilateral Distance:    Right Eye Near:   Left Eye Near:    Bilateral Near:     Physical Exam  General Appearance:    Alert, cooperative, no distress  HENT:   Normocephalic, ears normal, nares mucosal edema with congestion, rhinorrhea  Eyes:    PERRL, conjunctiva/corneas clear, EOM's intact       Lungs:    Coarse but clear lung sounds to auscultation bilaterally, respirations unlabored  Heart:    Regular rate and rhythm  Neurologic:   Awake, alert, oriented x 3. No apparent focal neurological            defect.     UC Treatments / Results  Labs (all labs ordered are listed, but only abnormal results are displayed) Labs Reviewed - No data to display  EKG   Radiology No results found.  Procedures Procedures (including critical care time)  Medications Ordered in UC Medications - No data to display  Initial Impression / Assessment and Plan / UC Course  I have reviewed the triage vital signs and the nursing notes.  Pertinent labs & imaging results that were available during my care of the patient were reviewed by me and considered in my medical decision making (see chart for details).     Treated for acute viral respiratory illness.  Treatment with prednisone 40 mg once daily for total 5 days for bronchial inflammation.  Ipratropium nasal spray 2 sprays twice daily to both nares as needed for congestion.  Benzonatate 200 mg 3 times daily as needed.  Return precautions discussed.  Final Clinical Impressions(s) / UC Diagnoses   Final diagnoses:  Viral respiratory illness   Discharge Instructions   None    ED Prescriptions    Medication Sig Dispense Auth. Provider   predniSONE (DELTASONE) 20 MG tablet Take 2 tablets (40 mg total) by mouth daily with breakfast. 10 tablet Bing Neighbors, FNP   ipratropium (ATROVENT) 0.03 % nasal spray Place 2 sprays into both nostrils 2 (two) times daily. 30 mL Bing Neighbors, FNP   benzonatate (TESSALON) 100 MG capsule Take 2 capsules (200 mg total) by mouth 3 (three) times daily as needed for cough. 40 capsule Bing Neighbors, FNP     PDMP not reviewed this encounter.   Bing Neighbors, FNP 07/12/20 [phone]

## 2020-07-10 NOTE — ED Triage Notes (Signed)
Cough and congestion for past few days  

## 2020-08-08 ENCOUNTER — Encounter: Payer: Self-pay | Admitting: Emergency Medicine

## 2020-08-08 ENCOUNTER — Ambulatory Visit
Admission: EM | Admit: 2020-08-08 | Discharge: 2020-08-08 | Disposition: A | Discharge status: Home / Self Care | Payer: Medicaid Other | Attending: Emergency Medicine | Admitting: Emergency Medicine

## 2020-08-08 ENCOUNTER — Other Ambulatory Visit: Payer: Self-pay

## 2020-08-08 DIAGNOSIS — L0231 Cutaneous abscess of buttock: Secondary | ICD-10-CM

## 2020-08-08 DIAGNOSIS — L03317 Cellulitis of buttock: Secondary | ICD-10-CM

## 2020-08-08 MED ORDER — DOXYCYCLINE HYCLATE 100 MG PO CAPS: 100.0000 mg | ORAL_CAPSULE | Freq: Two times a day (BID) | ORAL | 0 refills | Status: DC

## 2020-08-08 NOTE — ED Provider Notes (Signed)
[facility]   [id number] 08/08/20 Arrival Time: 1110   CC: ABSCESS  SUBJECTIVE:  Brittany Payne is a 30 y.o. female who presents with a possible abscess of her RT buttock x 1-2 days.  Describes as constant and throbbing.  Has tried OTC antibiotic ointment without relief.  Sore to the touch.  Reports hx of cellulitis on her nipples with similar symptoms.  Denies fever, chills, vomiting.  Reports nausea, redness ,and swelling.     ROS: As per HPI.  All other pertinent ROS negative.     Past Medical History:  Diagnosis Date  . Asthma   . Bipolar 1 disorder (HCC)   . Depression    Past Surgical History:  Procedure Laterality Date  . CHOLECYSTECTOMY    . HERNIA REPAIR    . MYRINGOTOMY    . TUBAL LIGATION     Allergies  Allergen Reactions  . Yellow Jacket Venom Anaphylaxis and Swelling  . Amoxicillin Nausea And Vomiting  . Celexa [Citalopram] Other (See Comments)    Patient states "makes me go crazy".  . Ketorolac Tromethamine Nausea And Vomiting  . Penicillins Diarrhea, Itching and Nausea And Vomiting    Has patient had a PCN reaction causing immediate rash, facial/tongue/throat swelling, SOB or lightheadedness with hypotension: Yes Has patient had a PCN reaction causing severe rash involving mucus membranes or skin necrosis: Yes Has patient had a PCN reaction that required hospitalization: Yes Has patient had a PCN reaction occurring within the last 10 years: No If all of the above answers are "NO", then may proceed with Cephalosporin use.   Marland Kitchen Vistaril [Hydroxyzine Hcl] Hives and Itching   No current facility-administered medications on file prior to encounter.   Current Outpatient Medications on File Prior to Encounter  Medication Sig Dispense Refill  . acetaminophen (TYLENOL) 325 MG tablet Take 650 mg by mouth every 6 (six) hours as needed for mild pain.     Marland Kitchen albuterol (VENTOLIN HFA) 108 (90 Base) MCG/ACT inhaler Inhale 2 puffs into the lungs every 4 (four)  hours as needed for wheezing or shortness of breath.    Marland Kitchen ipratropium (ATROVENT) 0.03 % nasal spray Place 2 sprays into both nostrils 2 (two) times daily. 30 mL 0  . [DISCONTINUED] cetirizine (ZYRTEC) 10 MG tablet Take 1 tablet (10 mg total) by mouth daily. (Patient not taking: Reported on 03/06/2020) 21 tablet 0   Social History   Socioeconomic History  . Marital status: Single    Spouse name: Not on file  . Number of children: Not on file  . Years of education: Not on file  . Highest education level: Not on file  Occupational History  . Not on file  Tobacco Use  . Smoking status: Current Every Day Smoker    Packs/day: 0.50    Years: 8.00    Pack years: 4.00    Types: Cigarettes    Last attempt to quit: 12/21/2015    Years since quitting: 4.6  . Smokeless tobacco: Never Used  Substance and Sexual Activity  . Alcohol use: No  . Drug use: Not Currently    Types: Marijuana  . Sexual activity: Yes    Birth control/protection: None  Other Topics Concern  . Not on file  Social History Narrative  . Not on file   Social Determinants of Health   Financial Resource Strain: Not on file  Food Insecurity: Not on file  Transportation Needs: Not on file  Physical Activity: Not on file  Stress: Not on file  Social Connections: Not on file  Intimate Partner Violence: Not on file   Family History  Problem Relation Age of Onset  . Hypertension Mother   . Diabetes Father   . Hypertension Father   . Cancer Father   . Diabetes Other     OBJECTIVE:  Vitals:   08/08/20 1131  BP: 129/81  Pulse: 86  Resp: 18  Temp: 97.8 F (36.6 C)  TempSrc: Oral  SpO2: 98%     General appearance: alert; no distress Skin: 1 cm induration of her RT buttock with mild overlying erythema; tender to touch; no active drainage Psychological: alert and cooperative; normal mood and affect   ASSESSMENT & PLAN:  1. Abscess of buttock, right   2. Cellulitis of buttock     Meds ordered this  encounter  Medications  . doxycycline (VIBRAMYCIN) 100 MG capsule    Sig: Take 1 capsule (100 mg total) by mouth 2 (two) times daily.    Dispense:  20 capsule    Refill:  0    Order Specific Question:   Supervising Provider    Answer:   Eustace Moore [[id number]]   Apply warm compresses 3-4x daily for 10-15 minutes Wash site daily with warm water and mild soap Keep covered to avoid friction Take antibiotic as prescribed and to completion Follow up here or with PCP if symptoms persists Return or go to the ED if you have any new or worsening symptoms increased redness, swelling, pain, nausea, vomiting, fever, chills, etc...   Reviewed expectations re: course of current medical issues. Questions answered. Outlined signs and symptoms indicating need for more acute intervention. Patient verbalized understanding. After Visit Summary given.          Rennis Harding, PA-C 08/08/20 1222

## 2020-08-08 NOTE — ED Triage Notes (Signed)
Unsure if she has a bug bite or abscess to right side of bottom.

## 2020-08-08 NOTE — Discharge Instructions (Signed)
Apply warm compresses 3-4x daily for 10-15 minutes Wash site daily with warm water and mild soap Keep covered to avoid friction Take antibiotic as prescribed and to completion Follow up here or with PCP if symptoms persists Return or go to the ED if you have any new or worsening symptoms increased redness, swelling, pain, nausea, vomiting, fever, chills, etc...  

## 2020-10-22 ENCOUNTER — Emergency Department (HOSPITAL_COMMUNITY): Payer: Medicaid Other

## 2020-10-22 ENCOUNTER — Emergency Department (HOSPITAL_COMMUNITY)
Admission: EM | Admit: 2020-10-22 | Discharge: 2020-10-22 | Disposition: A | Discharge status: Home / Self Care | Payer: Medicaid Other | Attending: Emergency Medicine | Admitting: Emergency Medicine

## 2020-10-22 DIAGNOSIS — H6692 Otitis media, unspecified, left ear: Secondary | ICD-10-CM | POA: Diagnosis not present

## 2020-10-22 DIAGNOSIS — F17210 Nicotine dependence, cigarettes, uncomplicated: Secondary | ICD-10-CM | POA: Diagnosis not present

## 2020-10-22 DIAGNOSIS — N83202 Unspecified ovarian cyst, left side: Secondary | ICD-10-CM | POA: Insufficient documentation

## 2020-10-22 DIAGNOSIS — J45909 Unspecified asthma, uncomplicated: Secondary | ICD-10-CM | POA: Diagnosis not present

## 2020-10-22 DIAGNOSIS — R112 Nausea with vomiting, unspecified: Secondary | ICD-10-CM | POA: Insufficient documentation

## 2020-10-22 DIAGNOSIS — H9202 Otalgia, left ear: Secondary | ICD-10-CM | POA: Diagnosis present

## 2020-10-22 DIAGNOSIS — N83201 Unspecified ovarian cyst, right side: Secondary | ICD-10-CM

## 2020-10-22 DIAGNOSIS — Z7952 Long term (current) use of systemic steroids: Secondary | ICD-10-CM | POA: Diagnosis not present

## 2020-10-22 DIAGNOSIS — E876 Hypokalemia: Secondary | ICD-10-CM | POA: Insufficient documentation

## 2020-10-22 LAB — URINALYSIS, ROUTINE W REFLEX MICROSCOPIC
Bilirubin Urine: NEGATIVE
Glucose, UA: NEGATIVE mg/dL
Hgb urine dipstick: NEGATIVE
Ketones, ur: NEGATIVE mg/dL
Leukocytes,Ua: NEGATIVE
Nitrite: NEGATIVE
Protein, ur: NEGATIVE mg/dL
Specific Gravity, Urine: 1.009 (ref 1.005–1.030)
pH: 7 (ref 5.0–8.0)

## 2020-10-22 LAB — CBC WITH DIFFERENTIAL/PLATELET
Abs Immature Granulocytes: 0.02 10*3/uL (ref 0.00–0.07)
Basophils Absolute: 0 10*3/uL (ref 0.0–0.1)
Basophils Relative: 0 %
Eosinophils Absolute: 0.1 10*3/uL (ref 0.0–0.5)
Eosinophils Relative: 1 %
HCT: 37.1 % (ref 36.0–46.0)
Hemoglobin: 12.7 g/dL (ref 12.0–15.0)
Immature Granulocytes: 0 %
Lymphocytes Relative: 11 %
Lymphs Abs: 0.8 10*3/uL (ref 0.7–4.0)
MCH: 30.7 pg (ref 26.0–34.0)
MCHC: 34.2 g/dL (ref 30.0–36.0)
MCV: 89.6 fL (ref 80.0–100.0)
Monocytes Absolute: 0.4 10*3/uL (ref 0.1–1.0)
Monocytes Relative: 5 %
Neutro Abs: 5.7 10*3/uL (ref 1.7–7.7)
Neutrophils Relative %: 83 %
Platelets: 105 10*3/uL — ABNORMAL LOW (ref 150–400)
RBC: 4.14 MIL/uL (ref 3.87–5.11)
RDW: 13.8 % (ref 11.5–15.5)
WBC: 7 10*3/uL (ref 4.0–10.5)
nRBC: 0 % (ref 0.0–0.2)

## 2020-10-22 LAB — COMPREHENSIVE METABOLIC PANEL
ALT: 11 U/L (ref 0–44)
AST: 10 U/L — ABNORMAL LOW (ref 15–41)
Albumin: 4 g/dL (ref 3.5–5.0)
Alkaline Phosphatase: 85 U/L (ref 38–126)
Anion gap: 7 (ref 5–15)
BUN: 5 mg/dL — ABNORMAL LOW (ref 6–20)
CO2: 25 mmol/L (ref 22–32)
Calcium: 8.7 mg/dL — ABNORMAL LOW (ref 8.9–10.3)
Chloride: 107 mmol/L (ref 98–111)
Creatinine, Ser: 0.41 mg/dL — ABNORMAL LOW (ref 0.44–1.00)
GFR, Estimated: >60 mL/min (ref >60)
Glucose, Bld: 105 mg/dL — ABNORMAL HIGH (ref 70–99)
Potassium: 3.1 mmol/L — ABNORMAL LOW (ref 3.5–5.1)
Sodium: 139 mmol/L (ref 135–145)
Total Bilirubin: 0.7 mg/dL (ref 0.3–1.2)
Total Protein: 7.1 g/dL (ref 6.5–8.1)

## 2020-10-22 LAB — PREGNANCY, URINE: Preg Test, Ur: NEGATIVE

## 2020-10-22 MED ORDER — ONDANSETRON HCL 8 MG PO TABS: 8.0000 mg | ORAL_TABLET | Freq: Three times a day (TID) | ORAL | 0 refills | Status: DC | PRN

## 2020-10-22 MED ORDER — POTASSIUM CHLORIDE CRYS ER 20 MEQ PO TBCR
40.0000 meq | EXTENDED_RELEASE_TABLET | Freq: Once | ORAL | Status: AC
  Administered 2020-10-22: 40 meq via ORAL
  Filled 2020-10-22: qty 4

## 2020-10-22 MED ORDER — ONDANSETRON HCL 4 MG PO TABS
4.0000 mg | ORAL_TABLET | Freq: Once | ORAL | Status: AC
  Administered 2020-10-22: 4 mg via ORAL
  Filled 2020-10-22: qty 1

## 2020-10-22 MED ORDER — ACETAMINOPHEN 325 MG PO TABS
650.0000 mg | ORAL_TABLET | Freq: Once | ORAL | Status: AC
  Administered 2020-10-22: 650 mg via ORAL
  Filled 2020-10-22: qty 2

## 2020-10-22 MED ORDER — SULFAMETHOXAZOLE-TRIMETHOPRIM 800-160 MG PO TABS
1.0000 | ORAL_TABLET | Freq: Once | ORAL | Status: AC
  Administered 2020-10-22: 1 via ORAL
  Filled 2020-10-22: qty 1

## 2020-10-22 MED ORDER — IOHEXOL 300 MG/ML  SOLN
100.0000 mL | Freq: Once | INTRAMUSCULAR | Status: AC | PRN
  Administered 2020-10-22: 100 mL via INTRAVENOUS

## 2020-10-22 MED ORDER — SULFAMETHOXAZOLE-TRIMETHOPRIM 800-160 MG PO TABS: 1.0000 | ORAL_TABLET | Freq: Two times a day (BID) | ORAL | 0 refills | Status: DC

## 2020-10-22 NOTE — ED Provider Notes (Signed)
[facility] EMERGENCY DEPARTMENT Provider Note   CSN: [id number] Arrival date & time: 10/22/20  0645     History Chief Complaint  Patient presents with   Abdominal Pain    Brittany Payne is a 30 y.o. female.  HPI She presents for evaluation of left ear pain with drainage for 2 weeks after getting material in it while swimming.  She tried cleaning it with a Q-tip but this did not seem to help.  She has a secondary complaint of quadrant abdominal pain for 2 days associated with nausea and vomiting.  She denies diarrhea, fever, chills, blood in emesis.  She is not having fever, cough or chest pain.  There are no other known modifying factors.    Past Medical History:  Diagnosis Date   Asthma    Bipolar 1 disorder [facility])    Depression     Patient Active Problem List   Diagnosis Date Noted   Umbilical hernia without obstruction and without gangrene 07/26/2019   Positive urine drug screen 12/31/2018   Anemia 02/05/2017   Bipolar disorder (HCC) 02/05/2017   Depression with anxiety 02/05/2017   Enlargement of spleen 02/05/2017   Migraine 02/05/2017    Past Surgical History:  Procedure Laterality Date   CHOLECYSTECTOMY     HERNIA REPAIR     MYRINGOTOMY     TUBAL LIGATION       OB History     Gravida  3   Para  2   Term  2   Preterm      AB      Living  2      SAB      IAB      Ectopic      Multiple      Live Births              Family History  Problem Relation Age of Onset   Hypertension Mother    Diabetes Father    Hypertension Father    Cancer Father    Diabetes Other     Social History   Tobacco Use   Smoking status: Every Day    Packs/day: 0.50    Years: 8.00    Pack years: 4.00    Types: Cigarettes    Last attempt to quit: 12/21/2015    Years since quitting: 4.8   Smokeless tobacco: Never  Substance Use Topics   Alcohol use: No   Drug use: Not Currently    Types: Marijuana    Home Medications Prior to Admission  medications   Medication Sig Start Date End Date Taking? Authorizing Provider  ondansetron (ZOFRAN) 8 MG tablet Take 1 tablet (8 mg total) by mouth every 8 (eight) hours as needed for nausea or vomiting. 10/22/20  Yes Mancel Bale, MD  sulfamethoxazole-trimethoprim (BACTRIM DS) 800-160 MG tablet Take 1 tablet by mouth 2 (two) times daily. 10/22/20  Yes Mancel Bale, MD  acetaminophen (TYLENOL) 325 MG tablet Take 650 mg by mouth every 6 (six) hours as needed for mild pain.     [provider]  albuterol (VENTOLIN HFA) 108 (90 Base) MCG/ACT inhaler Inhale 2 puffs into the lungs every 4 (four) hours as needed for wheezing or shortness of breath.    [provider]  doxycycline (VIBRAMYCIN) 100 MG capsule Take 1 capsule (100 mg total) by mouth 2 (two) times daily. 08/08/20   Wurst, [place], PA-C  ipratropium (ATROVENT) 0.03 % nasal spray Place 2 sprays into both nostrils 2 (  two) times daily. 07/10/20   Bing Neighbors, FNP  cetirizine (ZYRTEC) 10 MG tablet Take 1 tablet (10 mg total) by mouth daily. Patient not taking: Reported on 03/06/2020 09/07/19 03/16/20  Pauline Aus, PA-C    Allergies    Yellow jacket venom, Amoxicillin, Celexa [citalopram], Ketorolac tromethamine, Penicillins, and Vistaril [hydroxyzine hcl]  Review of Systems   Review of Systems  All other systems reviewed and are negative.  Physical Exam Updated Vital Signs BP 125/90   Pulse 78   Temp 98.2 F (36.8 C) (Oral)   Resp 12   Ht 5\' 2"  (1.575 m)   Wt 83.9 kg   SpO2 96%   BMI 33.84 kg/m   Physical Exam Vitals and nursing note reviewed.  Constitutional:      General: She is not in acute distress.    Appearance: She is well-developed. She is not ill-appearing, toxic-appearing or diaphoretic.  HENT:     Head: Normocephalic and atraumatic.     Right Ear: Tympanic membrane and external ear normal.     Left Ear: External ear normal.     Ears:     Comments: Left TM opaque with small area of  erythema, possibly healing puncture wound.    Nose: No congestion.     Mouth/Throat:     Mouth: Mucous membranes are moist.     Pharynx: No oropharyngeal exudate or posterior oropharyngeal erythema.  Eyes:     Conjunctiva/sclera: Conjunctivae normal.     Pupils: Pupils are equal, round, and reactive to light.  Neck:     Trachea: Phonation normal.  Cardiovascular:     Rate and Rhythm: Normal rate and regular rhythm.  Pulmonary:     Effort: Pulmonary effort is normal.     Breath sounds: Normal breath sounds.  Chest:     Chest wall: No tenderness.  Abdominal:     General: There is no distension.     Palpations: Abdomen is soft.     Tenderness: There is abdominal tenderness (Left midabdomen, mild). There is no guarding.  Musculoskeletal:        General: Normal range of motion.     Cervical back: Normal range of motion and neck supple.  Skin:    General: Skin is warm and dry.  Neurological:     Mental Status: She is alert and oriented to person, place, and time.     Motor: No abnormal muscle tone.  Psychiatric:        Mood and Affect: Mood normal.        Behavior: Behavior normal.        Thought Content: Thought content normal.        Judgment: Judgment normal.    ED Results / Procedures / Treatments   Labs (all labs ordered are listed, but only abnormal results are displayed) Labs Reviewed  CBC WITH DIFFERENTIAL/PLATELET - Abnormal; Notable for the following components:      Result Value   Platelets 105 (*)    All other components within normal limits  COMPREHENSIVE METABOLIC PANEL - Abnormal; Notable for the following components:   Potassium 3.1 (*)    Glucose, Bld 105 (*)    BUN 5 (*)    Creatinine, Ser 0.41 (*)    Calcium 8.7 (*)    AST 10 (*)    All other components within normal limits  URINALYSIS, ROUTINE W REFLEX MICROSCOPIC  PREGNANCY, URINE    EKG EKG Interpretation  Date/Time:  Sunday October 22 2020 06:57:29  EDT Ventricular Rate:  84 PR  Interval:  155 QRS Duration: 89 QT Interval:  367 QTC Calculation: 434 R Axis:   46 Text Interpretation: Sinus rhythm since last tracing no significant change Confirmed by Mancel BaleWentz, Waniya Hoglund [phone]) on 10/22/2020 7:20:25 AM  Radiology CT Abdomen Pelvis W Contrast  Result Date: 10/22/2020 CLINICAL DATA:  Left upper quadrant abdominal pain for the past 2 days. EXAM: CT ABDOMEN AND PELVIS WITH CONTRAST TECHNIQUE: Multidetector CT imaging of the abdomen and pelvis was performed using the standard protocol following bolus administration of intravenous contrast. CONTRAST:  100mL OMNIPAQUE IOHEXOL 300 MG/ML  SOLN COMPARISON:  10/27/2014 stone study. FINDINGS: Lower chest: Right lower lobe 3 mm pulmonary nodule is similar on the prior exam and considered benign. Normal heart size without pericardial or pleural effusion. Hepatobiliary: Variant lateral segment left liver lobe extending in the left upper quadrant. Cholecystectomy, without biliary ductal dilatation. Pancreas: Normal, without mass or ductal dilatation. Spleen: Splenomegaly, including at 17.0 cm craniocaudal. 15.4 cm on 10/27/2014. Transverse dimensions today of 9.8 x 8.6 cm. Splenic volume of 750 cm^3). Adrenals/Urinary Tract: Normal adrenal glands. Bilateral too small to characterize renal lesions. No hydronephrosis. Decompressed urinary bladder. Stomach/Bowel: Normal stomach, without wall thickening. The colon is decompressed. Normal colon, appendix, and terminal ileum. Normal small bowel. Vascular/Lymphatic: Aortic atherosclerosis. No abdominopelvic adenopathy. Reproductive: Normal uterus. Right ovarian corpus luteal cyst of 2.2 cm on 71/2. Other: Trace right adnexal and less so cul-de-sac fluid. No free intraperitoneal air. Musculoskeletal: No acute osseous abnormality. IMPRESSION: 1. No acute process in the abdomen or pelvis. 2. Right ovarian corpus luteal cyst. Trace right adnexal and less so cul-de-sac fluid, likely secondary to recent cyst rupture.  3. Splenomegaly, progressive compared to 2016. 4. Aortic Atherosclerosis (ICD10-I70.0). Electronically Signed   By: Jeronimo GreavesKyle  Talbot M.D.   On: 10/22/2020 11:14    Procedures Procedures   Medications Ordered in ED Medications  sulfamethoxazole-trimethoprim (BACTRIM DS) 800-160 MG per tablet 1 tablet (1 tablet Oral Given 10/22/20 0744)  acetaminophen (TYLENOL) tablet 650 mg (650 mg Oral Given 10/22/20 0743)  ondansetron (ZOFRAN) tablet 4 mg (4 mg Oral Given 10/22/20 0820)  potassium chloride SA (KLOR-CON) CR tablet 40 mEq (40 mEq Oral Given 10/22/20 0948)  iohexol (OMNIPAQUE) 300 MG/ML solution 100 mL (100 mLs Intravenous Contrast Given 10/22/20 1038)    ED Course  I have reviewed the triage vital signs and the nursing notes.  Pertinent labs & imaging results that were available during my care of the patient were reviewed by me and considered in my medical decision making (see chart for details).  Clinical Course as of 10/22/20 1420  Sun Oct 22, 2020  [id number] She remains uncomfortable with abdominal pain.  Will order CT imaging. [EW]    Clinical Course User Index [EW] Mancel BaleWentz, Cuthbert Turton, MD   MDM Rules/Calculators/A&P                           Patient Vitals for the past 24 hrs:  BP Temp Temp src Pulse Resp SpO2 Height Weight  10/22/20 1404 -- 98.2 F (36.8 C) Oral 78 12 96 % -- --  10/22/20 1400 125/90 -- -- -- -- -- -- --  10/22/20 0945 -- -- -- 78 12 96 % -- --  10/22/20 0930 116/77 -- -- 60 20 98 % -- --  10/22/20 0900 (!) 105/55 -- -- 68 14 100 % -- --  10/22/20 0856 123/84 -- -- 74 (!) 23 97 % -- --  10/22/20 0800 106/66 -- -- 67 20 96 % -- --  10/22/20 0720 124/77 -- -- 79 11 94 % -- --  10/22/20 0654 -- -- -- -- -- -- 5\' 2"  (1.575 m) 83.9 kg  10/22/20 0652 103/66 98.5 F (36.9 C) -- 91 (!) 23 97 % -- --    2:13 PM Reevaluation with update and discussion. After initial assessment and treatment, an updated evaluation reveals she reports overall better now.  She is not vomiting.   Findings discussed and questions answered. 12/22/20   Medical Decision Making:  This patient is presenting for evaluation of left ear pain, which does require a range of treatment options, and is a complaint that involves a moderate risk of morbidity and mortality. The differential diagnoses include external otitis, otitis media, sinusitis. I decided to review old records, and in summary middle-aged female presenting with left ear pain for 2 weeks.  She has a secondary problem of abdominal pain with nausea and vomiting for 2 days.  Relatively healthy middle-aged female with history of cholecystectomy about plication. I did not require additional historical information from anyone.  Clinical Laboratory Tests Ordered, included CBC, Metabolic panel, Urinalysis, and Pregnancy test. Review indicates normal except potassium low, glucose high, BUN low, creatinine low, calcium low. Radiologic Tests Ordered, included CT abdomen pelvis.  I independently Visualized: Radiographic images, which show no acute abnormality    Critical Interventions-clinical evaluation, laboratory testing, antiemetic medication, and oral antibiotic, reevaluation, potassium ordered.  After These Interventions, the Patient was reevaluated and was found stable for discharge.  Patient with multiple complaints, and findings consistent with left otitis media.  Nonspecific vomiting with reassuring CT imaging.  She has a right-sided corpus luteum cyst and may have had a cyst rupture which could contribute to the abdominal discomfort.  There is no indication for further ED testing, or hospital mission at this time.  CRITICAL CARE-no Performed by: Mancel Bale  Nursing Notes Reviewed/ Care Coordinated Applicable Imaging Reviewed Interpretation of Laboratory Data incorporated into ED treatment  The patient appears reasonably screened and/or stabilized for discharge and I doubt any other medical condition or other [facility] requiring  further screening, evaluation, or treatment in the ED at this time prior to discharge.  Plan: Home Medications-symptomatic care as needed and continue usual medication; Home Treatments-gradual advance diet and activity; return here if the recommended treatment, does not improve the symptoms; Recommended follow up-PCP follow-up 1 week and as needed     Final Clinical Impression(s) / ED Diagnoses Final diagnoses:  Left otitis media, unspecified otitis media type  Non-intractable vomiting with nausea, unspecified vomiting type  Cyst of right ovary  Hypokalemia    Rx / DC Orders ED Discharge Orders          Ordered    ondansetron (ZOFRAN) 8 MG tablet  Every 8 hours PRN        10/22/20 1417    sulfamethoxazole-trimethoprim (BACTRIM DS) 800-160 MG tablet  2 times daily        10/22/20 1417             12/22/20, MD 10/22/20 1420

## 2020-10-22 NOTE — ED Triage Notes (Signed)
Pt has had LUQ abdominal pain the last two days, becoming progressivley worse. Additionally pt has had left ear pain with drainage the last two weeks.

## 2020-10-22 NOTE — Discharge Instructions (Addendum)
The testing today indicates that you have an ear infection on the left causing your pain.  There were no complications seen, associated with nausea and vomiting.  You do have a small ovarian cyst on the right and may have had another ovarian cyst that ruptured.  Follow-up with your primary care doctor for checkup in a week or so.  Your potassium levels are low.  Try to eat foods which contain more potassium to improve that.  When you see your doctor they can recheck your potassium level.  Return here if needed.

## 2020-10-27 ENCOUNTER — Other Ambulatory Visit: Payer: Self-pay

## 2020-10-27 ENCOUNTER — Encounter (HOSPITAL_COMMUNITY): Payer: Self-pay | Admitting: *Deleted

## 2020-10-27 ENCOUNTER — Emergency Department (HOSPITAL_COMMUNITY): Payer: Medicaid Other

## 2020-10-27 ENCOUNTER — Emergency Department (HOSPITAL_COMMUNITY)
Admission: EM | Admit: 2020-10-27 | Discharge: 2020-10-27 | Disposition: A | Discharge status: Home / Self Care | Payer: Medicaid Other | Attending: Emergency Medicine | Admitting: Emergency Medicine

## 2020-10-27 DIAGNOSIS — Z20822 Contact with and (suspected) exposure to covid-19: Secondary | ICD-10-CM | POA: Insufficient documentation

## 2020-10-27 DIAGNOSIS — F17210 Nicotine dependence, cigarettes, uncomplicated: Secondary | ICD-10-CM | POA: Diagnosis not present

## 2020-10-27 DIAGNOSIS — J45909 Unspecified asthma, uncomplicated: Secondary | ICD-10-CM | POA: Insufficient documentation

## 2020-10-27 DIAGNOSIS — R161 Splenomegaly, not elsewhere classified: Secondary | ICD-10-CM | POA: Diagnosis not present

## 2020-10-27 DIAGNOSIS — R1012 Left upper quadrant pain: Secondary | ICD-10-CM

## 2020-10-27 DIAGNOSIS — R112 Nausea with vomiting, unspecified: Secondary | ICD-10-CM

## 2020-10-27 DIAGNOSIS — Z7951 Long term (current) use of inhaled steroids: Secondary | ICD-10-CM | POA: Diagnosis not present

## 2020-10-27 DIAGNOSIS — R197 Diarrhea, unspecified: Secondary | ICD-10-CM

## 2020-10-27 LAB — CBC WITH DIFFERENTIAL/PLATELET
Abs Immature Granulocytes: 0.02 10*3/uL (ref 0.00–0.07)
Basophils Absolute: 0 10*3/uL (ref 0.0–0.1)
Basophils Relative: 0 %
Eosinophils Absolute: 0.1 10*3/uL (ref 0.0–0.5)
Eosinophils Relative: 1 %
HCT: 37.5 % (ref 36.0–46.0)
Hemoglobin: 12.8 g/dL (ref 12.0–15.0)
Immature Granulocytes: 0 %
Lymphocytes Relative: 14 %
Lymphs Abs: 1.1 10*3/uL (ref 0.7–4.0)
MCH: 31 pg (ref 26.0–34.0)
MCHC: 34.1 g/dL (ref 30.0–36.0)
MCV: 90.8 fL (ref 80.0–100.0)
Monocytes Absolute: 0.4 10*3/uL (ref 0.1–1.0)
Monocytes Relative: 5 %
Neutro Abs: 6.3 10*3/uL (ref 1.7–7.7)
Neutrophils Relative %: 80 %
Platelets: 126 10*3/uL — ABNORMAL LOW (ref 150–400)
RBC: 4.13 MIL/uL (ref 3.87–5.11)
RDW: 14 % (ref 11.5–15.5)
WBC: 8 10*3/uL (ref 4.0–10.5)
nRBC: 0 % (ref 0.0–0.2)

## 2020-10-27 LAB — URINALYSIS, ROUTINE W REFLEX MICROSCOPIC
Bilirubin Urine: NEGATIVE
Glucose, UA: NEGATIVE mg/dL
Hgb urine dipstick: NEGATIVE
Ketones, ur: NEGATIVE mg/dL
Leukocytes,Ua: NEGATIVE
Nitrite: NEGATIVE
Protein, ur: NEGATIVE mg/dL
Specific Gravity, Urine: 1.015 (ref 1.005–1.030)
pH: 5.5 (ref 5.0–8.0)

## 2020-10-27 LAB — COMPREHENSIVE METABOLIC PANEL
ALT: 9 U/L (ref 0–44)
AST: 10 U/L — ABNORMAL LOW (ref 15–41)
Albumin: 4 g/dL (ref 3.5–5.0)
Alkaline Phosphatase: 76 U/L (ref 38–126)
Anion gap: 8 (ref 5–15)
BUN: 7 mg/dL (ref 6–20)
CO2: 23 mmol/L (ref 22–32)
Calcium: 8.9 mg/dL (ref 8.9–10.3)
Chloride: 106 mmol/L (ref 98–111)
Creatinine, Ser: 0.49 mg/dL (ref 0.44–1.00)
GFR, Estimated: >60 mL/min (ref >60)
Glucose, Bld: 94 mg/dL (ref 70–99)
Potassium: 3.6 mmol/L (ref 3.5–5.1)
Sodium: 137 mmol/L (ref 135–145)
Total Bilirubin: 0.4 mg/dL (ref 0.3–1.2)
Total Protein: 7 g/dL (ref 6.5–8.1)

## 2020-10-27 LAB — POC URINE PREG, ED: Preg Test, Ur: NEGATIVE

## 2020-10-27 LAB — RESP PANEL BY RT-PCR (FLU A&B, COVID) ARPGX2
Influenza A by PCR: NEGATIVE
Influenza B by PCR: NEGATIVE
SARS Coronavirus 2 by RT PCR: NEGATIVE

## 2020-10-27 LAB — LIPASE, BLOOD: Lipase: 29 U/L (ref 11–51)

## 2020-10-27 LAB — TROPONIN I (HIGH SENSITIVITY): Troponin I (High Sensitivity): <2 ng/L (ref <18)

## 2020-10-27 MED ORDER — MORPHINE SULFATE (PF) 4 MG/ML IV SOLN
4.0000 mg | Freq: Once | INTRAVENOUS | Status: AC
  Administered 2020-10-27: 4 mg via INTRAVENOUS
  Filled 2020-10-27: qty 1

## 2020-10-27 MED ORDER — HYDROCODONE-ACETAMINOPHEN 5-325 MG PO TABS: 1.0000 | ORAL_TABLET | Freq: Four times a day (QID) | ORAL | 0 refills | Status: DC | PRN

## 2020-10-27 MED ORDER — SODIUM CHLORIDE 0.9 % IV SOLN
12.5000 mg | Freq: Four times a day (QID) | INTRAVENOUS | Status: DC | PRN
  Filled 2020-10-27: qty 0.5

## 2020-10-27 MED ORDER — PROMETHAZINE HCL 25 MG PO TABS: 25.0000 mg | ORAL_TABLET | Freq: Four times a day (QID) | ORAL | 0 refills | Status: DC | PRN

## 2020-10-27 MED ORDER — LOPERAMIDE HCL 2 MG PO CAPS: 2.0000 mg | ORAL_CAPSULE | Freq: Four times a day (QID) | ORAL | 0 refills | Status: DC | PRN

## 2020-10-27 MED ORDER — SODIUM CHLORIDE 0.9 % IV BOLUS
1000.0000 mL | Freq: Once | INTRAVENOUS | Status: AC
  Administered 2020-10-27: 1000 mL via INTRAVENOUS

## 2020-10-27 NOTE — ED Triage Notes (Signed)
Pt brought in by [facility] from home with c/o LUQ abdominal pain radiating to her mid chest along with n/v. Pt reports when vomiting she feels like she is blacking out. Reports temp of 100.4 and chills yesterday.

## 2020-10-27 NOTE — Discharge Instructions (Addendum)
Your labs today are stable and you are being prescribed medicine to help you with your nausea, vomiting and diarrhea.  Rest and make sure you are drinking plenty of fluids. You are not dehydrated today by your labs but the IV fluids given should help you feel better.  It may be helpful to get further evaluation of your enlarged spleen, although chronic, it was larger when it was imaged at your last visit - call Dr. Ellin Saba for this evaluation.

## 2020-10-27 NOTE — ED Provider Notes (Signed)
[facility] EMERGENCY DEPARTMENT Provider Note   CSN: [id number] Arrival date & time: 10/27/20  1118     History Chief Complaint  Patient presents with   Abdominal Pain    Brittany Payne is a 30 y.o. female with a history of asthma, bipolar disorder, chronic splenomegaly, surgical history including cholecystectomy and ventral hernia repair with mesh 1 year ago presenting for evaluation of persistent left upper quadrant abdominal pain which is now been present for 1 week.  She describes initially having mild discomfort in the left upper quadrant which has become more intense now with radiation into her mid back region and up into her chest with waxing and waning symptoms.  She also endorses nausea with emesis although states over the past 24 hours she has had dry heaving only.  She also endorses diarrhea but only when she is vomiting.  She had a fever of 100.4 yesterday evening.  She has had no exposures to others with similar symptoms, no foreign travel, no obvious consumption of improperly prepared food.  She was seen here on Sunday for the same symptoms and her work-up was unremarkable including a abdominal and pelvic CT scan.  She was placed on Bactrim for treatment of a otitis media which she states is improving.  She has found no alleviators for symptoms.  She was prescribed Zofran which has not been helpful.  She reports a chronic cough which is not new, attributes to her smoking habit.  She has had a home COVID screen this week which was negative.  The history is provided by the patient.      Past Medical History:  Diagnosis Date   Asthma    Bipolar 1 disorder [facility])    Depression     Patient Active Problem List   Diagnosis Date Noted   Umbilical hernia without obstruction and without gangrene 07/26/2019   Positive urine drug screen 12/31/2018   Anemia 02/05/2017   Bipolar disorder (HCC) 02/05/2017   Depression with anxiety 02/05/2017   Enlargement of spleen 02/05/2017    Migraine 02/05/2017    Past Surgical History:  Procedure Laterality Date   CHOLECYSTECTOMY     HERNIA REPAIR     MYRINGOTOMY     TUBAL LIGATION       OB History     Gravida  3   Para  2   Term  2   Preterm      AB      Living  2      SAB      IAB      Ectopic      Multiple      Live Births              Family History  Problem Relation Age of Onset   Hypertension Mother    Diabetes Father    Hypertension Father    Cancer Father    Diabetes Other     Social History   Tobacco Use   Smoking status: Every Day    Packs/day: 0.50    Years: 8.00    Pack years: 4.00    Types: Cigarettes    Last attempt to quit: 12/21/2015    Years since quitting: 4.8   Smokeless tobacco: Never  Vaping Use   Vaping Use: Never used  Substance Use Topics   Alcohol use: No   Drug use: Not Currently    Types: Marijuana    Home Medications Prior to Admission medications  Medication Sig Start Date End Date Taking? Authorizing Provider  HYDROcodone-acetaminophen (NORCO/VICODIN) 5-325 MG tablet Take 1 tablet by mouth every 6 (six) hours as needed. 10/27/20  Yes Karl Erway, Raynelle Fanning, PA-C  loperamide (IMODIUM) 2 MG capsule Take 1 capsule (2 mg total) by mouth 4 (four) times daily as needed for diarrhea or loose stools. 10/27/20  Yes Sarenity Ramaker, Raynelle Fanning, PA-C  promethazine (PHENERGAN) 25 MG tablet Take 1 tablet (25 mg total) by mouth every 6 (six) hours as needed for nausea or vomiting. 10/27/20  Yes Inis Borneman, Raynelle Fanning, PA-C  acetaminophen (TYLENOL) 325 MG tablet Take 650 mg by mouth every 6 (six) hours as needed for mild pain.     [provider]  albuterol (VENTOLIN HFA) 108 (90 Base) MCG/ACT inhaler Inhale 2 puffs into the lungs every 4 (four) hours as needed for wheezing or shortness of breath.    [provider]  doxycycline (VIBRAMYCIN) 100 MG capsule Take 1 capsule (100 mg total) by mouth 2 (two) times daily. 08/08/20   Wurst, [place], PA-C  ipratropium (ATROVENT) 0.03 % nasal  spray Place 2 sprays into both nostrils 2 (two) times daily. 07/10/20   Bing Neighbors, FNP  ondansetron (ZOFRAN) 8 MG tablet Take 1 tablet (8 mg total) by mouth every 8 (eight) hours as needed for nausea or vomiting. 10/22/20   Mancel Bale, MD  sulfamethoxazole-trimethoprim (BACTRIM DS) 800-160 MG tablet Take 1 tablet by mouth 2 (two) times daily. 10/22/20   Mancel Bale, MD  cetirizine (ZYRTEC) 10 MG tablet Take 1 tablet (10 mg total) by mouth daily. Patient not taking: Reported on 03/06/2020 09/07/19 03/16/20  Pauline Aus, PA-C    Allergies    Yellow jacket venom, Amoxicillin, Celexa [citalopram], Ketorolac tromethamine, Penicillins, and Vistaril [hydroxyzine hcl]  Review of Systems   Review of Systems  Constitutional:  Negative for fever.  HENT:  Negative for congestion and sore throat.   Eyes: Negative.   Respiratory:  Positive for cough. Negative for chest tightness and shortness of breath.   Cardiovascular:  Positive for chest pain.  Gastrointestinal:  Positive for abdominal pain, nausea and vomiting.  Genitourinary: Negative.   Musculoskeletal:  Negative for arthralgias, joint swelling and neck pain.  Skin: Negative.  Negative for rash and wound.  Neurological:  Negative for dizziness, weakness, light-headedness, numbness and headaches.  Psychiatric/Behavioral: Negative.     Physical Exam Updated Vital Signs BP (!) 104/58   Pulse 70   Temp 98.4 F (36.9 C) (Oral)   Resp 13   Ht 5\' 2"  (1.575 m)   Wt 83.9 kg   LMP 10/09/2020   SpO2 97%   BMI 33.84 kg/m   Physical Exam Vitals and nursing note reviewed.  Constitutional:      Appearance: She is well-developed.  HENT:     Head: Normocephalic and atraumatic.  Eyes:     Conjunctiva/sclera: Conjunctivae normal.  Cardiovascular:     Rate and Rhythm: Normal rate and regular rhythm.     Heart sounds: Normal heart sounds.  Pulmonary:     Effort: Pulmonary effort is normal.     Breath sounds: Normal breath sounds.  No wheezing.  Abdominal:     General: Bowel sounds are normal.     Palpations: Abdomen is soft. There is no mass.     Tenderness: There is abdominal tenderness in the left upper quadrant. There is no guarding or rebound.     Comments: Well healed surgical incision midline  Musculoskeletal:  General: Normal range of motion.     Cervical back: Normal range of motion.  Skin:    General: Skin is warm and dry.  Neurological:     Mental Status: She is alert.    ED Results / Procedures / Treatments   Labs (all labs ordered are listed, but only abnormal results are displayed) Labs Reviewed  COMPREHENSIVE METABOLIC PANEL - Abnormal; Notable for the following components:      Result Value   AST 10 (*)    All other components within normal limits  CBC WITH DIFFERENTIAL/PLATELET - Abnormal; Notable for the following components:   Platelets 126 (*)    All other components within normal limits  URINALYSIS, ROUTINE W REFLEX MICROSCOPIC - Abnormal; Notable for the following components:   APPearance HAZY (*)    All other components within normal limits  RESP PANEL BY RT-PCR (FLU A&B, COVID) ARPGX2  LIPASE, BLOOD  POC URINE PREG, ED  TROPONIN I (HIGH SENSITIVITY)    EKG None  Radiology DG Chest Port 1 View  Result Date: 10/27/2020 CLINICAL DATA:  Cough and chest pain. EXAM: PORTABLE CHEST 1 VIEW COMPARISON:  Radiographs 02/16/2020.  CT 07/31/2019. FINDINGS: 1236 hours. The heart size and mediastinal contours are normal. The lungs are clear. There is no pleural effusion or pneumothorax. No acute osseous findings are identified. Telemetry leads overlie the chest. IMPRESSION: No active cardiopulmonary process. Electronically Signed   By: Carey BullocksWilliam  Veazey M.D.   On: 10/27/2020 12:49    Procedures Procedures   Medications Ordered in ED Medications  promethazine (PHENERGAN) 12.5 mg in sodium chloride 0.9 % 50 mL IVPB (has no administration in time range)  sodium chloride 0.9 % bolus  1,000 mL (0 mLs Intravenous Stopped 10/27/20 1433)  morphine 4 MG/ML injection 4 mg (4 mg Intravenous Given 10/27/20 1428)    ED Course  I have reviewed the triage vital signs and the nursing notes.  Pertinent labs & imaging results that were available during my care of the patient were reviewed by me and considered in my medical decision making (see chart for details).    MDM Rules/Calculators/A&P                          Labs reviewed and discussed patient, very reassuring with no electrolyte abnormalities, she does not appear dehydrated at this time.  She was given IV fluids.  She is also given IV Phenergan and she had complete resolution of her nausea and vomiting.  Pain is resolved after given IV morphine.  Imaging from her previous visit was reviewed.  We discussed her increasing's splenomegaly.  Patient states she has had this since she was a child.  It is unclear whether this could be a source of her pain, but definitely would not be the source of any diarrhea and vomiting, suggesting this is a viral process.  She was prescribed Phenergan and Imodium for symptom relief.  She was referred to Dr. Ellin SabaKatragadda for further evaluation of her splenomegaly.  She has an appointment with her new PCP in 3 days, Dr. Garner Nashaniels in [place] to establish care.  Return precautions were outlined.  Patient was stable for discharge home. Final Clinical Impression(s) / ED Diagnoses Final diagnoses:  Left upper quadrant abdominal pain  Splenomegaly  Non-intractable vomiting with nausea, unspecified vomiting type  Diarrhea of presumed infectious origin    Rx / DC Orders ED Discharge Orders  Ordered    promethazine (PHENERGAN) 25 MG tablet  Every 6 hours PRN        10/27/20 1513    loperamide (IMODIUM) 2 MG capsule  4 times daily PRN        10/27/20 1513    HYDROcodone-acetaminophen (NORCO/VICODIN) 5-325 MG tablet  Every 6 hours PRN        10/27/20 1523             Burgess Amor, PA-C 10/27/20  1545    Bethann Berkshire, MD 10/30/20 [phone]

## 2020-11-07 ENCOUNTER — Encounter (HOSPITAL_COMMUNITY): Payer: Self-pay | Admitting: Hematology and Oncology

## 2020-11-07 ENCOUNTER — Inpatient Hospital Stay (HOSPITAL_COMMUNITY): Payer: Medicaid Other | Attending: Hematology and Oncology | Admitting: Hematology and Oncology

## 2020-11-07 ENCOUNTER — Other Ambulatory Visit: Payer: Self-pay

## 2020-11-07 VITALS — BP 116/87 | HR 93 | Temp 97.0°F | Resp 18 | Ht 62.0 in

## 2020-11-07 DIAGNOSIS — R6883 Chills (without fever): Secondary | ICD-10-CM | POA: Diagnosis not present

## 2020-11-07 DIAGNOSIS — J45909 Unspecified asthma, uncomplicated: Secondary | ICD-10-CM | POA: Diagnosis not present

## 2020-11-07 DIAGNOSIS — R161 Splenomegaly, not elsewhere classified: Secondary | ICD-10-CM | POA: Diagnosis not present

## 2020-11-07 DIAGNOSIS — F319 Bipolar disorder, unspecified: Secondary | ICD-10-CM | POA: Diagnosis not present

## 2020-11-08 ENCOUNTER — Other Ambulatory Visit (HOSPITAL_COMMUNITY): Payer: Self-pay | Admitting: *Deleted

## 2020-11-09 ENCOUNTER — Inpatient Hospital Stay (HOSPITAL_COMMUNITY): Payer: Medicaid Other

## 2020-11-09 ENCOUNTER — Other Ambulatory Visit: Payer: Self-pay

## 2020-11-09 DIAGNOSIS — R161 Splenomegaly, not elsewhere classified: Secondary | ICD-10-CM | POA: Diagnosis not present

## 2020-11-09 LAB — CBC WITH DIFFERENTIAL/PLATELET
Abs Immature Granulocytes: 0.02 10*3/uL (ref 0.00–0.07)
Basophils Absolute: 0 10*3/uL (ref 0.0–0.1)
Basophils Relative: 0 %
Eosinophils Absolute: 0.1 10*3/uL (ref 0.0–0.5)
Eosinophils Relative: 1 %
HCT: 39.4 % (ref 36.0–46.0)
Hemoglobin: 13.3 g/dL (ref 12.0–15.0)
Immature Granulocytes: 0 %
Lymphocytes Relative: 16 %
Lymphs Abs: 1.5 10*3/uL (ref 0.7–4.0)
MCH: 30.2 pg (ref 26.0–34.0)
MCHC: 33.8 g/dL (ref 30.0–36.0)
MCV: 89.5 fL (ref 80.0–100.0)
Monocytes Absolute: 0.4 10*3/uL (ref 0.1–1.0)
Monocytes Relative: 5 %
Neutro Abs: 7.5 10*3/uL (ref 1.7–7.7)
Neutrophils Relative %: 78 %
Platelets: 174 10*3/uL (ref 150–400)
RBC: 4.4 MIL/uL (ref 3.87–5.11)
RDW: 13.8 % (ref 11.5–15.5)
WBC: 9.6 10*3/uL (ref 4.0–10.5)
nRBC: 0 % (ref 0.0–0.2)

## 2020-11-09 LAB — COMPREHENSIVE METABOLIC PANEL
ALT: 10 U/L (ref 0–44)
AST: 11 U/L — ABNORMAL LOW (ref 15–41)
Albumin: 4 g/dL (ref 3.5–5.0)
Alkaline Phosphatase: 91 U/L (ref 38–126)
Anion gap: 8 (ref 5–15)
BUN: 8 mg/dL (ref 6–20)
CO2: 24 mmol/L (ref 22–32)
Calcium: 8.6 mg/dL — ABNORMAL LOW (ref 8.9–10.3)
Chloride: 105 mmol/L (ref 98–111)
Creatinine, Ser: 0.51 mg/dL (ref 0.44–1.00)
GFR, Estimated: >60 mL/min (ref >60)
Glucose, Bld: 85 mg/dL (ref 70–99)
Potassium: 3.7 mmol/L (ref 3.5–5.1)
Sodium: 137 mmol/L (ref 135–145)
Total Bilirubin: 0.6 mg/dL (ref 0.3–1.2)
Total Protein: 7.1 g/dL (ref 6.5–8.1)

## 2020-11-09 LAB — C-REACTIVE PROTEIN: CRP: <0.5 mg/dL (ref <1.0)

## 2020-11-09 LAB — LACTATE DEHYDROGENASE: LDH: 120 U/L (ref 98–192)

## 2020-11-09 LAB — SEDIMENTATION RATE: Sed Rate: 5 mm/hr (ref 0–22)

## 2020-11-10 ENCOUNTER — Other Ambulatory Visit (HOSPITAL_COMMUNITY): Payer: Self-pay

## 2020-11-10 DIAGNOSIS — R161 Splenomegaly, not elsewhere classified: Secondary | ICD-10-CM

## 2020-11-10 LAB — SURGICAL PATHOLOGY

## 2020-11-13 NOTE — Progress Notes (Signed)
[facility] [phone]   Fax:([phone]  INITIAL CONSULT NOTE  Patient Care Team: Practice, [facility] as PCP - General  Hematological/Oncological History # Splenomegaly 10/22/2020: presented to ED with abdominal pain. CT abdomen showed splenomegaly, including at 17.0 cm craniocaudal. 15.4 cm on 10/27/2014. Transverse dimensions today of 9.8 x 8.6 cm. Splenic volume of 750 cm^3). 11/07/2020: establish care with Dr. Leonides Schanz   CHIEF COMPLAINTS/PURPOSE OF CONSULTATION:  "Splenomegaly "  HISTORY OF PRESENTING ILLNESS:  Brittany Payne 30 y.o. female with medical history significant for asthma and bipolar disorder who presents for evaluation of worsening splenomegaly.   On review of the previous records Brittany Payne presented the emergency department on 10/22/2020 with left-sided abdominal pain.  She had a CT scan performed which showed splenomegaly including a 17.0 cm craniocaudal measurement.  Total volume spleen was found to be 750 cm^3.  Prior CT scan on 01/10/2012 showed mild splenomegaly with an estimated volume of 420 cc.  There is no evidence of liver disease.  Due to concern for this finding the patient was referred to hematology for further evaluation management.  On exam today Brittany Payne reports that she began not feeling well approximately 3 to 4 weeks ago.  When this progressively worsened she went to the emergency department and was found to have the splenomegaly.  She does endorse having chills and she is unsure why this is occurring.  She notes that she does not have any issues with sweats.  She also endorses that she has had some issues with fatigue.  Recently she is also had some soreness in her throat but she reported began after she had her cholecystectomy.  On further discussion she reports that her family history is significant for her mother being anemic and her father having lung cancer, hypertension, and type 2 diabetes.  She notes that  she does smoke black and mild periodically and that she does not drink any alcohol.  She has never used any IV drugs.  A full 10 point ROS is listed below.  MEDICAL HISTORY:  Past Medical History:  Diagnosis Date   Asthma    Bipolar 1 disorder (HCC)    Depression     SURGICAL HISTORY: Past Surgical History:  Procedure Laterality Date   CESAREAN SECTION N/A    CHOLECYSTECTOMY     HERNIA REPAIR     MYRINGOTOMY     TUBAL LIGATION      SOCIAL HISTORY: Social History   Socioeconomic History   Marital status: Single    Spouse name: Not on file   Number of children: 4   Years of education: Not on file   Highest education level: Not on file  Occupational History   Not on file  Tobacco Use   Smoking status: Former    Packs/day: 0.50    Years: 8.00    Pack years: 4.00    Types: Cigarettes    Quit date: 12/21/2015    Years since quitting: 4.9   Smokeless tobacco: Never  Vaping Use   Vaping Use: Never used  Substance and Sexual Activity   Alcohol use: No   Drug use: Not Currently    Types: Marijuana   Sexual activity: Yes    Birth control/protection: None  Other Topics Concern   Not on file  Social History Narrative   Not on file   Social Determinants of Health   Financial Resource Strain: Low Risk    Difficulty of Paying Living Expenses: Not  hard at all  Food Insecurity: No Food Insecurity   Worried About Programme researcher, broadcasting/film/videounning Out of Food in the Last Year: Never true   Ran Out of Food in the Last Year: Never true  Transportation Needs: No Transportation Needs   Lack of Transportation (Medical): No   Lack of Transportation (Non-Medical): No  Physical Activity: Sufficiently Active   Days of Exercise per Week: 7 days   Minutes of Exercise per Session: 60 min  Stress: No Stress Concern Present   Feeling of Stress : Not at all  Social Connections: Moderately Integrated   Frequency of Communication with Friends and Family: More than three times a week   Frequency of Social  Gatherings with Friends and Family: Once a week   Attends Religious Services: More than 4 times per year   Active Member of [employer]ubs or Organizations: No   Attends Engineer, structuralClub or Organization Meetings: Never   Marital Status: Living with partner  Catering managerntimate Partner Violence: Not on file    FAMILY HISTORY: Family History  Problem Relation Age of Onset   Hypertension Mother    Diabetes Father    Hypertension Father    Cancer Father    Diabetes Other     ALLERGIES:  is allergic to yellow jacket venom, amoxicillin, celexa [citalopram], ketorolac tromethamine, penicillins, and vistaril [hydroxyzine hcl].  MEDICATIONS:  Current Outpatient Medications  Medication Sig Dispense Refill   acetaminophen (TYLENOL) 325 MG tablet Take 650 mg by mouth every 6 (six) hours as needed for mild pain.      albuterol (VENTOLIN HFA) 108 (90 Base) MCG/ACT inhaler Inhale 2 puffs into the lungs every 4 (four) hours as needed for wheezing or shortness of breath.     HYDROcodone-acetaminophen (NORCO/VICODIN) 5-325 MG tablet Take 1 tablet by mouth every 6 (six) hours as needed. 10 tablet 0   ipratropium (ATROVENT) 0.03 % nasal spray Place 2 sprays into both nostrils 2 (two) times daily. 30 mL 0   loperamide (IMODIUM) 2 MG capsule Take 1 capsule (2 mg total) by mouth 4 (four) times daily as needed for diarrhea or loose stools. 12 capsule 0   promethazine (PHENERGAN) 25 MG tablet Take 1 tablet (25 mg total) by mouth every 6 (six) hours as needed for nausea or vomiting. 30 tablet 0   doxycycline (VIBRAMYCIN) 100 MG capsule Take 1 capsule (100 mg total) by mouth 2 (two) times daily. 20 capsule 0   ondansetron (ZOFRAN) 8 MG tablet Take 1 tablet (8 mg total) by mouth every 8 (eight) hours as needed for nausea or vomiting. 20 tablet 0   No current facility-administered medications for this visit.    REVIEW OF SYSTEMS:   Constitutional: ( - ) fevers, ( - )  chills , ( - ) night sweats Eyes: ( - ) blurriness of vision, ( - )  double vision, ( - ) watery eyes Ears, nose, mouth, throat, and face: ( - ) mucositis, ( - ) sore throat Respiratory: ( - ) cough, ( - ) dyspnea, ( - ) wheezes Cardiovascular: ( - ) palpitation, ( - ) chest discomfort, ( - ) lower extremity swelling Gastrointestinal:  ( - ) nausea, ( - ) heartburn, ( - ) change in bowel habits Skin: ( - ) abnormal skin rashes Lymphatics: ( - ) new lymphadenopathy, ( - ) easy bruising Neurological: ( - ) numbness, ( - ) tingling, ( - ) new weaknesses Behavioral/Psych: ( - ) mood change, ( - ) new changes  All other  systems were reviewed with the patient and are negative.  PHYSICAL EXAMINATION:  Vitals:   11/07/20 1310  BP: 116/87  Pulse: 93  Resp: 18  Temp: (!) 97 F (36.1 C)  SpO2: 100%   Filed Weights    GENERAL: well appearing middle aged Caucasian female in NAD  SKIN: skin color, texture, turgor are normal, no rashes or significant lesions EYES: conjunctiva are pink and non-injected, sclera clear NECK: supple, non-tender LYMPH:  no palpable lymphadenopathy in the cervical, axillary or supraclavicular lymph nodes.  LUNGS: clear to auscultation and percussion with normal breathing effort HEART: regular rate & rhythm and no murmurs and no lower extremity edema PSYCH: alert & oriented x 3, fluent speech NEURO: no focal motor/sensory deficits  LABORATORY DATA:  I have reviewed the data as listed CBC Latest Ref Rng & Units 11/09/2020 10/27/2020 10/22/2020  WBC 4.0 - 10.5 K/uL 9.6 8.0 7.0  Hemoglobin 12.0 - 15.0 g/dL 94.8 54.6 27.0  Hematocrit 36.0 - 46.0 % 39.4 37.5 37.1  Platelets 150 - 400 K/uL 174 126(L) 105(L)    CMP Latest Ref Rng & Units 11/09/2020 10/27/2020 10/22/2020  Glucose 70 - 99 mg/dL 85 94 350(K)  BUN 6 - 20 mg/dL 8 7 5(L)  Creatinine 9.38 - 1.00 mg/dL 1.82 9.93 7.16(R)  Sodium 135 - 145 mmol/L 137 137 139  Potassium 3.5 - 5.1 mmol/L 3.7 3.6 3.1(L)  Chloride 98 - 111 mmol/L 105 106 107  CO2 22 - 32 mmol/L 24 23 25   Calcium  8.9 - 10.3 mg/dL ) 8.9 6.7(E)  Total Protein 6.5 - 8.1 g/dL 7.1 7.0 7.1  Total Bilirubin 0.3 - 1.2 mg/dL 0.6 0.4 0.7  Alkaline Phos 38 - 126 U/L 91 76 85  AST 15 - 41 U/L 11(L) 10(L) 10(L)  ALT 0 - 44 U/L 10 9 11     RADIOGRAPHIC STUDIES: I have personally reviewed the radiological images as listed and agreed with the findings in the report: enlarged spleen CT Abdomen Pelvis W Contrast  Result Date: 10/22/2020 CLINICAL DATA:  Left upper quadrant abdominal pain for the past 2 days. EXAM: CT ABDOMEN AND PELVIS WITH CONTRAST TECHNIQUE: Multidetector CT imaging of the abdomen and pelvis was performed using the standard protocol following bolus administration of intravenous contrast. CONTRAST:  OMNIPAQUE IOHEXOL 300 MG/ML  SOLN COMPARISON:  10/27/2014 stone study. FINDINGS: Lower chest: Right lower lobe 3 mm pulmonary nodule is similar on the prior exam and considered benign. Normal heart size without pericardial or pleural effusion. Hepatobiliary: Variant lateral segment left liver lobe extending in the left upper quadrant. Cholecystectomy, without biliary ductal dilatation. Pancreas: Normal, without mass or ductal dilatation. Spleen: Splenomegaly, including at 17.0 cm craniocaudal. 15.4 cm on 10/27/2014. Transverse dimensions today of 9.8 x 8.6 cm. Splenic volume of 750 cm^3). Adrenals/Urinary Tract: Normal adrenal glands. Bilateral too small to characterize renal lesions. No hydronephrosis. Decompressed urinary bladder. Stomach/Bowel: Normal stomach, without wall thickening. The colon is decompressed. Normal colon, appendix, and terminal ileum. Normal small bowel. Vascular/Lymphatic: Aortic atherosclerosis. No abdominopelvic adenopathy. Reproductive: Normal uterus. Right ovarian corpus luteal cyst of 2.2 cm on 71/2. Other: Trace right adnexal and less so cul-de-sac fluid. No free intraperitoneal air. Musculoskeletal: No acute osseous abnormality. IMPRESSION: 1. No acute process in the abdomen or  pelvis. 2. Right ovarian corpus luteal cyst. Trace right adnexal and less so cul-de-sac fluid, likely secondary to recent cyst rupture. 3. Splenomegaly, progressive compared to 2016. 4. Aortic Atherosclerosis (ICD10-I70.0). Electronically Signed   By: 10/29/2014  Reche Dixon M.D.   On: 10/22/2020 11:14   DG Chest Port 1 View  Result Date: 10/27/2020 CLINICAL DATA:  Cough and chest pain. EXAM: PORTABLE CHEST 1 VIEW COMPARISON:  Radiographs 02/16/2020.  CT 07/31/2019. FINDINGS: 1236 hours. The heart size and mediastinal contours are normal. The lungs are clear. There is no pleural effusion or pneumothorax. No acute osseous findings are identified. Telemetry leads overlie the chest. IMPRESSION: No active cardiopulmonary process. Electronically Signed   By: Carey Bullocks M.D.   On: 10/27/2020 12:49    ASSESSMENT & PLAN Brittany Payne 30 y.o. female with medical history significant for asthma and bipolar disorder who presents for evaluation of worsening splenomegaly.   After review of the labs, review of the records, and discussion with the patient the patients findings are most consistent with splenomegaly of unclear etiology.  The patient does not have any evidence of liver disease on her prior CT scan.  Additionally the process has been slowly progressing over the last 8 to 9 years and therefore more likely represents an indolent or benign process.  We will begin a baseline work-up today and in the event that we do not find a clear etiology we will have the patient return in 1 month for further evaluation.  #Splenomegaly -- Given the length of time it is taken for the spleen to grow I do believe it to represent a benign or indolent condition --Nevertheless we will perform a full work-up. Prior labs showed negative hepatitis B, HIV in 2018. No Hepatitis C on file.  --We will additionally order LDH and flow cytometry to look for indolent lymphomas.  No other lymphadenopathy noted therefore this is less  likely --We will order new baseline CBC and CMP  -- Return to clinic in approximately 1 month for further evaluation.  Orders Placed This Encounter  Procedures   CBC with Differential (Cancer Center Only)    Standing Status:   Future    Number of Occurrences:   1    Standing Expiration Date:   11/08/2021   Lactate dehydrogenase (LDH)    Standing Status:   Future    Number of Occurrences:   1    Standing Expiration Date:   11/08/2021   Flow Cytometry, Peripheral Blood (Oncology)    Standing Status:   Future    Number of Occurrences:   1    Standing Expiration Date:   11/08/2021   Sedimentation rate    Standing Status:   Future    Number of Occurrences:   1    Standing Expiration Date:   11/08/2021   C-reactive protein    Standing Status:   Future    Number of Occurrences:   1    Standing Expiration Date:   11/08/2021    All questions were answered. The patient knows to call the clinic with any problems, questions or concerns.  A total of more than 60 minutes were spent on this encounter with face-to-face time and non-face-to-face time, including preparing to see the patient, ordering tests and/or medications, counseling the patient and coordination of care as outlined above.   Ulysees Barns, MD Department of Hematology/Oncology [facility] Phone: [phone] Pager: [phone] Email: [email or web address]  11/13/2020 8:10 PM

## 2020-11-16 ENCOUNTER — Ambulatory Visit (INDEPENDENT_AMBULATORY_CARE_PROVIDER_SITE_OTHER): Payer: Medicaid Other

## 2020-11-16 ENCOUNTER — Other Ambulatory Visit: Payer: Self-pay

## 2020-11-16 ENCOUNTER — Other Ambulatory Visit (HOSPITAL_COMMUNITY)
Admission: RE | Admit: 2020-11-16 | Discharge: 2020-11-16 | Disposition: A | Discharge status: Home / Self Care | Payer: Medicaid Other | Source: Ambulatory Visit | Attending: Internal Medicine | Admitting: Internal Medicine

## 2020-11-16 ENCOUNTER — Ambulatory Visit (INDEPENDENT_AMBULATORY_CARE_PROVIDER_SITE_OTHER): Payer: Medicaid Other | Admitting: Internal Medicine

## 2020-11-16 ENCOUNTER — Other Ambulatory Visit: Payer: Self-pay | Admitting: Internal Medicine

## 2020-11-16 ENCOUNTER — Encounter: Payer: Self-pay | Admitting: Internal Medicine

## 2020-11-16 VITALS — BP 110/80 | HR 84 | Ht 62.0 in | Wt 167.0 lb

## 2020-11-16 DIAGNOSIS — Z13220 Encounter for screening for lipoid disorders: Secondary | ICD-10-CM | POA: Diagnosis not present

## 2020-11-16 DIAGNOSIS — R079 Chest pain, unspecified: Secondary | ICD-10-CM

## 2020-11-16 DIAGNOSIS — R002 Palpitations: Secondary | ICD-10-CM | POA: Diagnosis not present

## 2020-11-16 NOTE — Progress Notes (Signed)
Cardiology Office Note   Date:  11/16/2020   ID:  Brittany Payne, DOB 1990/08/22, MRN [id number]  PCP:  [facility]  Cardiologist:   Dietrich Pates, MD   Pt referred for eval of LUQ/chst discomfort as well as atherosclerosis   History of Present Illness: Brittany Payne is a 30 y.o. female with a history of bipolar disorder, asthma,  LUQ pain, shoulder pain   Began abou tapril   Occur about 2 to 3x per day   Can occur with waking    But also with moving    Last about 5 to 15 min before easing   No SOB   Not positional     Patient also notes some heart racing    No dizziness The pt was seen in the [facility] ED for this on 10/27/20  Compalined also of some N/V, diarrhea  ALos back then of low grade fever   Exam at that point she was tender in LUQ  CT of the abdomen was done whch showed splenomegaly (vol 750cm3)  Atherosclerosis of aorta also noted    DUe to this the pt was referred to cardiology         Current Meds  Medication Sig   acetaminophen (TYLENOL) 325 MG tablet Take 650 mg by mouth every 6 (six) hours as needed for mild pain.    albuterol (VENTOLIN HFA) 108 (90 Base) MCG/ACT inhaler Inhale 2 puffs into the lungs every 4 (four) hours as needed for wheezing or shortness of breath.   ipratropium (ATROVENT) 0.03 % nasal spray Place 2 sprays into both nostrils 2 (two) times daily.   loperamide (IMODIUM) 2 MG capsule Take 1 capsule (2 mg total) by mouth 4 (four) times daily as needed for diarrhea or loose stools.   promethazine (PHENERGAN) 25 MG tablet Take 1 tablet (25 mg total) by mouth every 6 (six) hours as needed for nausea or vomiting.     Allergies:   Yellow jacket venom, Amoxicillin, Celexa [citalopram], Ketorolac tromethamine, Penicillins, and Vistaril [hydroxyzine hcl]   Past Medical History:  Diagnosis Date   Asthma    Bipolar 1 disorder (HCC)    Depression     Past Surgical History:  Procedure Laterality Date   CESAREAN SECTION N/A    CHOLECYSTECTOMY      HERNIA REPAIR     MYRINGOTOMY     TUBAL LIGATION       Social History:  The patient  reports that she quit smoking about 4 years ago. Her smoking use included cigarettes. She has a 4.00 pack-year smoking history. She has never used smokeless tobacco. She reports previous drug use. Drug: Marijuana. She reports that she does not drink alcohol.   Family History:  The patient's family history includes Cancer in her father; Diabetes in her father and another family member; Hypertension in her father and mother; Lung cancer in her father.    ROS:  Please see the history of present illness. All other systems are reviewed and  Negative to the above problem except as noted.    PHYSICAL EXAM: VS:  BP 110/80 (BP Location: Right Arm)   Pulse 84   Ht 5\' 2"  (1.575 m)   Wt 167 lb (75.8 kg)   SpO2 98%   BMI 30.54 kg/m   GEN: Obese 30 yo  in no acute distress  HEENT: normal  Neck: no JVD, carotid bruits, or masses Cardiac: RRR; no murmurs, No LE edema  Respiratory:  clear  to auscultation bilaterally GI: soft, nontender, nondistended, + BS  No hepatomegaly  MS: no deformity Moving all extremities   Skin: warm and dry, no rash Neuro:  Strength and sensation are intact Psych: euthymic mood, full affect   EKG:  EKG is ordered today.  SR 88 bpm   Nonspecif CT changes with sl sagging of ST segments inferiorly    CT abdomen     IMPRESSION: 1. No acute process in the abdomen or pelvis. 2. Right ovarian corpus luteal cyst. Trace right adnexal and less so cul-de-sac fluid, likely secondary to recent cyst rupture. 3. Splenomegaly, progressive compared to 2016. 4. Aortic Atherosclerosis (ICD10-I70.0).   Lipid Panel No results found for: CHOL, TRIG, HDL, CHOLHDL, VLDL, LDLCALC, LDLDIRECT    Wt Readings from Last 3 Encounters:  11/16/20 167 lb (75.8 kg)  10/27/20 185 lb (83.9 kg)  10/22/20 185 lb (83.9 kg)      ASSESSMENT AND PLAN:  1  Abdominal / Chest pain  Aypical for cardiac  Some  is related to moving though It is most likely related to splenomegaly    Pt to be seen in heme clinic      Will sched echo   2  Heart racing   WIll set up for event monitor to evaluate     3  Atherosclerosis   Will review CT scan with radiology regarding burden  Set up for lipomed panel with Lp(a), ApoB     4  Splenomegaly  Seen by hematology   Slowly progressive   Work up in progress.     Current medicines are reviewed at length with the patient today.  The patient does not have concerns regarding medicines.  Signed, Dietrich Pates, MD  11/16/2020 10:27 AM    [facility] [address] [place], [place], [place]  [address] Phone: [phone]; Fax: [phone]

## 2020-11-16 NOTE — Patient Instructions (Signed)
Medication Instructions:  Your physician recommends that you continue on your current medications as directed. Please refer to the Current Medication list given to you today.  *If you need a refill on your cardiac medications before your next appointment, please call your pharmacy*   Lab Work: NONE   If you have labs (blood work) drawn today and your tests are completely normal, you will receive your results only by: MyChart Message (if you have MyChart) OR A paper copy in the mail If you have any lab test that is abnormal or we need to change your treatment, we will call you to review the results.   Testing/Procedures: Your physician has requested that you have an echocardiogram. Echocardiography is a painless test that uses sound waves to create images of your heart. It provides your doctor with information about the size and shape of your heart and how well your heart's chambers and valves are working. This procedure takes approximately one hour. There are no restrictions for this procedure.  ZIO XT- Long Term Monitor Instructions   Your physician has requested you wear your ZIO patch monitor___14___days.   This is a single patch monitor.  [employer] supplies one patch monitor per enrollment.  Additional stickers are not available.   Please do not apply patch if you will be having a Nuclear Stress Test, Echocardiogram, Cardiac CT, MRI, or Chest Xray during the time frame you would be wearing the monitor. The patch cannot be worn during these tests.  You cannot remove and re-apply the ZIO XT patch monitor.   Your ZIO patch monitor will be sent USPS Priority mail from [employer] directly to your home address. The monitor may also be mailed to a PO BOX if home delivery is not available.   It may take 3-5 days to receive your monitor after you have been enrolled.   Once you have received you monitor, please review enclosed instructions.  Your monitor has already been registered  assigning a specific monitor serial # to you.   Applying the monitor   Shave hair from upper left chest.   Hold abrader disc by orange tab.  Rub abrader in 40 strokes over left upper chest as indicated in your monitor instructions.   Clean area with 4 enclosed alcohol pads .  Use all pads to assure are is cleaned thoroughly.  Let dry.   Apply patch as indicated in monitor instructions.  Patch will be place under collarbone on left side of chest with arrow pointing upward.   Rub patch adhesive wings for 2 minutes.Remove white label marked "1".  Remove white label marked "2".  Rub patch adhesive wings for 2 additional minutes.   While looking in a mirror, press and release button in center of patch.  A small green light will flash 3-4 times .  This will be your only indicator the monitor has been turned on.     Do not shower for the first 24 hours.  You may shower after the first 24 hours.   Press button if you feel a symptom. You will hear a small click.  Record Date, Time and Symptom in the Patient Log Book.   When you are ready to remove patch, follow instructions on last 2 pages of Patient Log Book.  Stick patch monitor onto last page of Patient Log Book.   Place Patient Log Book in Blue box.  Use locking tab on box and tape box closed securely.  The Orange and White box   has prepaid postage on it.  Please place in mailbox as soon as possible.  Your physician should have your test results approximately 7 days after the monitor has been mailed back to [employer].   Call [employer] Customer Care at [phone] if you have questions regarding your ZIO XT patch monitor.  Call them immediately if you see an orange light blinking on your monitor.   If your monitor falls off in less than 4 days contact our Monitor department at [phone].  If your monitor becomes loose or falls off after 4 days call [employer] at [phone] for suggestions on securing your monitor.      Follow-Up: At [facility], you and your health needs are our priority.  As part of our continuing mission to provide you with exceptional heart care, we have created designated Provider Care Teams.  These Care Teams include your primary Cardiologist (physician) and Advanced Practice Providers (APPs -  Physician Assistants and Nurse Practitioners) who all work together to provide you with the care you need, when you need it.  We recommend signing up for the patient portal called "MyChart".  Sign up information is provided on this After Visit Summary.  MyChart is used to connect with patients for Virtual Visits (Telemedicine).  Patients are able to view lab/test results, encounter notes, upcoming appointments, etc.  Non-urgent messages can be sent to your provider as well.   To learn more about what you can do with MyChart, go to [email or web address].    Your next appointment:    Pending test results   The format for your next appointment:   In Person  Provider:   Paula Ross, MD   Other Instructions Thank you for choosing [facility]!    

## 2020-11-17 LAB — LIPOPROTEIN A (LPA): Lipoprotein (a): <8.4 nmol/L (ref <75.0)

## 2020-11-17 LAB — MISC LABCORP TEST (SEND OUT): Labcorp test code: 167015

## 2020-11-22 ENCOUNTER — Other Ambulatory Visit (HOSPITAL_COMMUNITY)
Admission: RE | Admit: 2020-11-22 | Discharge: 2020-11-22 | Disposition: A | Discharge status: Home / Self Care | Payer: Medicaid Other | Source: Ambulatory Visit | Attending: Internal Medicine | Admitting: Internal Medicine

## 2020-11-22 DIAGNOSIS — Z13220 Encounter for screening for lipoid disorders: Secondary | ICD-10-CM | POA: Insufficient documentation

## 2020-11-23 LAB — MISC LABCORP TEST (SEND OUT): Labcorp test code: 884247

## 2020-11-28 ENCOUNTER — Telehealth: Payer: Self-pay | Admitting: Internal Medicine

## 2020-11-28 ENCOUNTER — Telehealth: Payer: Self-pay | Admitting: *Deleted

## 2020-11-28 DIAGNOSIS — Z13220 Encounter for screening for lipoid disorders: Secondary | ICD-10-CM

## 2020-11-28 NOTE — Telephone Encounter (Signed)
Pt.notified

## 2020-11-28 NOTE — Telephone Encounter (Signed)
Patient is returning call to [facility]

## 2020-11-28 NOTE — Telephone Encounter (Signed)
-----   Message from Dietrich Pates V, MD sent at 11/27/2020 11:00 PM EDT ----- LDL is 127   Given atherosclerosis in aorta I would recomm she try 10 mg Crestor   F/U lipomed panel in 8 wks with liver function panel

## 2020-12-01 ENCOUNTER — Other Ambulatory Visit: Payer: Self-pay

## 2020-12-01 ENCOUNTER — Ambulatory Visit (HOSPITAL_COMMUNITY)
Admission: RE | Admit: 2020-12-01 | Discharge: 2020-12-01 | Disposition: A | Discharge status: Home / Self Care | Payer: Medicaid Other | Source: Ambulatory Visit | Attending: Internal Medicine | Admitting: Internal Medicine

## 2020-12-01 DIAGNOSIS — R079 Chest pain, unspecified: Secondary | ICD-10-CM | POA: Diagnosis present

## 2020-12-01 LAB — ECHOCARDIOGRAM COMPLETE
Area-P 1/2: 3.48 cm2
S' Lateral: 2.6 cm

## 2020-12-01 NOTE — Progress Notes (Signed)
*  PRELIMINARY RESULTS* Echocardiogram 2D Echocardiogram has been performed.  Stacey Drain 12/01/2020, 12:21 PM

## 2020-12-05 ENCOUNTER — Telehealth: Payer: Self-pay | Admitting: Internal Medicine

## 2020-12-05 NOTE — Telephone Encounter (Signed)
Spoke to pt who stated that she still feels a slight fluttering in her chest, but that she feels much better. Pt wanted results of cardiac monitor, but they have not been received yet. Pt aware that we will call her once results are finalized.

## 2020-12-05 NOTE — Telephone Encounter (Signed)
Patient called back, said Dr Tenny Craw called to check on her yesterday 7/25. Her chest pain has eased up some/but still there, since she has been taking the Rosuvastatin.  Her chest has still been fluttering but seems to be doing ok. They are still waiting on Zio patch results(turned in monitor on 7/22). She can be reached at [phone].

## 2020-12-07 ENCOUNTER — Inpatient Hospital Stay (HOSPITAL_COMMUNITY): Payer: Medicaid Other

## 2020-12-08 ENCOUNTER — Encounter (HOSPITAL_COMMUNITY): Payer: Self-pay

## 2020-12-08 ENCOUNTER — Ambulatory Visit (HOSPITAL_COMMUNITY): Payer: Medicaid Other | Admitting: Physician Assistant

## 2020-12-08 ENCOUNTER — Other Ambulatory Visit (HOSPITAL_COMMUNITY): Payer: Self-pay

## 2020-12-19 ENCOUNTER — Inpatient Hospital Stay (HOSPITAL_COMMUNITY): Payer: Medicaid Other | Attending: Hematology

## 2020-12-19 ENCOUNTER — Other Ambulatory Visit: Payer: Self-pay

## 2020-12-19 DIAGNOSIS — R6881 Early satiety: Secondary | ICD-10-CM | POA: Diagnosis not present

## 2020-12-19 DIAGNOSIS — R509 Fever, unspecified: Secondary | ICD-10-CM | POA: Diagnosis not present

## 2020-12-19 DIAGNOSIS — R161 Splenomegaly, not elsewhere classified: Secondary | ICD-10-CM | POA: Diagnosis present

## 2020-12-19 DIAGNOSIS — D6959 Other secondary thrombocytopenia: Secondary | ICD-10-CM | POA: Insufficient documentation

## 2020-12-19 DIAGNOSIS — R5383 Other fatigue: Secondary | ICD-10-CM | POA: Insufficient documentation

## 2020-12-19 DIAGNOSIS — R61 Generalized hyperhidrosis: Secondary | ICD-10-CM | POA: Insufficient documentation

## 2020-12-19 DIAGNOSIS — R112 Nausea with vomiting, unspecified: Secondary | ICD-10-CM | POA: Diagnosis not present

## 2020-12-19 LAB — COMPREHENSIVE METABOLIC PANEL
ALT: 9 U/L (ref 0–44)
AST: 12 U/L — ABNORMAL LOW (ref 15–41)
Albumin: 4.1 g/dL (ref 3.5–5.0)
Alkaline Phosphatase: 78 U/L (ref 38–126)
Anion gap: 6 (ref 5–15)
BUN: 7 mg/dL (ref 6–20)
CO2: 26 mmol/L (ref 22–32)
Calcium: 8.7 mg/dL — ABNORMAL LOW (ref 8.9–10.3)
Chloride: 105 mmol/L (ref 98–111)
Creatinine, Ser: 0.64 mg/dL (ref 0.44–1.00)
GFR, Estimated: >60 mL/min (ref >60)
Glucose, Bld: 110 mg/dL — ABNORMAL HIGH (ref 70–99)
Potassium: 3.4 mmol/L — ABNORMAL LOW (ref 3.5–5.1)
Sodium: 137 mmol/L (ref 135–145)
Total Bilirubin: 0.7 mg/dL (ref 0.3–1.2)
Total Protein: 7.1 g/dL (ref 6.5–8.1)

## 2020-12-19 LAB — CBC WITH DIFFERENTIAL/PLATELET
Abs Immature Granulocytes: 0.04 10*3/uL (ref 0.00–0.07)
Basophils Absolute: 0 10*3/uL (ref 0.0–0.1)
Basophils Relative: 0 %
Eosinophils Absolute: 0.1 10*3/uL (ref 0.0–0.5)
Eosinophils Relative: 1 %
HCT: 39.9 % (ref 36.0–46.0)
Hemoglobin: 13.4 g/dL (ref 12.0–15.0)
Immature Granulocytes: 0 %
Lymphocytes Relative: 13 %
Lymphs Abs: 1.3 10*3/uL (ref 0.7–4.0)
MCH: 30 pg (ref 26.0–34.0)
MCHC: 33.6 g/dL (ref 30.0–36.0)
MCV: 89.5 fL (ref 80.0–100.0)
Monocytes Absolute: 0.4 10*3/uL (ref 0.1–1.0)
Monocytes Relative: 4 %
Neutro Abs: 8.4 10*3/uL — ABNORMAL HIGH (ref 1.7–7.7)
Neutrophils Relative %: 82 %
Platelets: 141 10*3/uL — ABNORMAL LOW (ref 150–400)
RBC: 4.46 MIL/uL (ref 3.87–5.11)
RDW: 14.3 % (ref 11.5–15.5)
WBC: 10.3 10*3/uL (ref 4.0–10.5)
nRBC: 0 % (ref 0.0–0.2)

## 2020-12-19 LAB — LACTATE DEHYDROGENASE: LDH: 138 U/L (ref 98–192)

## 2020-12-19 NOTE — Progress Notes (Signed)
 [facility] [address] [place], [place] [address]   CLINIC:  Medical Oncology/Hematology  PCP:  [facility] [address] [place] [address] [phone]   REASON FOR VISIT:  Follow-up for splenomegaly  PRIOR THERAPY: None  CURRENT THERAPY: Under work-up  INTERVAL HISTORY:  Brittany Payne 30 y.o. female returns for routine follow-up of splenomegaly.  She was seen by Dr. Dorsey for initial consultation visit on 11/07/2020.  At today's visit, she reports feeling somewhat poor.    She continues to have left upper quadrant and epigastric pain, that is worse when she eats or drinks, or when she stretches in a certain way.  She also notes that she has had frequent hiccups.  She complains of early satiety, nausea, and vomiting.  She has had decreased appetite due to the above.  She also reports significant progressive fatigue, reports that she is "just exhausted all the time."  She has lost 5 pounds since her last visit.  She reports chills and diaphoresis for the past 6 to 8 weeks.  She has drenching night sweats most nights of the week.  She denies any fevers.  No new lumps or bumps.  She is being worked up by gastroenterology in [place] (Dr. Kathy) due to dysphagia and concern for possible peptic ulcer disease.  Patient denies any family history of glycogen-storage disease.  She has 25% energy and 50% appetite.    REVIEW OF SYSTEMS:  Review of Systems  Constitutional:  Positive for appetite change, chills, diaphoresis, fatigue and unexpected weight change. Negative for fever.  HENT:   Positive for trouble swallowing. Negative for lump/mass and nosebleeds.   Eyes:  Negative for eye problems.  Respiratory:  Negative for cough, hemoptysis and shortness of breath.   Cardiovascular:  Negative for chest pain, leg swelling and palpitations.  Gastrointestinal:  Positive for abdominal pain, constipation, nausea and vomiting. Negative for blood in stool and  diarrhea.  Genitourinary:  Negative for hematuria.   Skin: Negative.   Neurological:  Positive for numbness (occasional numbness in fingertips). Negative for dizziness, headaches and light-headedness.  Hematological:  Does not bruise/bleed easily.  Psychiatric/Behavioral:  Positive for sleep disturbance.      PAST MEDICAL/SURGICAL HISTORY:  Past Medical History:  Diagnosis Date   Asthma    Bipolar 1 disorder (HCC)    Depression    Past Surgical History:  Procedure Laterality Date   CESAREAN SECTION N/A    CHOLECYSTECTOMY     HERNIA REPAIR     MYRINGOTOMY     TUBAL LIGATION       SOCIAL HISTORY:  Social History   Socioeconomic History   Marital status: Single    Spouse name: Not on file   Number of children: 4   Years of education: Not on file   Highest education level: Not on file  Occupational History   Not on file  Tobacco Use   Smoking status: Former    Packs/day: 0.50    Years: 8.00    Pack years: 4.00    Types: Cigarettes    Quit date: 12/21/2015    Years since quitting: 5.0   Smokeless tobacco: Never  Vaping Use   Vaping Use: Never used  Substance and Sexual Activity   Alcohol use: No   Drug use: Not Currently    Types: Marijuana   Sexual activity: Yes    Birth control/protection: None  Other Topics Concern   Not on file  Social History Narrative     Not on file   Social Determinants of Health   Financial Resource Strain: Low Risk    Difficulty of Paying Living Expenses: Not hard at all  Food Insecurity: No Food Insecurity   Worried About Running Out of Food in the Last Year: Never true   Ran Out of Food in the Last Year: Never true  Transportation Needs: No Transportation Needs   Lack of Transportation (Medical): No   Lack of Transportation (Non-Medical): No  Physical Activity: Sufficiently Active   Days of Exercise per Week: 7 days   Minutes of Exercise per Session: 60 min  Stress: No Stress Concern Present   Feeling of Stress : Not at all   Social Connections: Moderately Integrated   Frequency of Communication with Friends and Family: More than three times a week   Frequency of Social Gatherings with Friends and Family: Once a week   Attends Religious Services: More than 4 times per year   Active Member of Clubs or Organizations: No   Attends Club or Organization Meetings: Never   Marital Status: Living with partner  Intimate Partner Violence: Not on file    FAMILY HISTORY:  Family History  Problem Relation Age of Onset   Hypertension Mother    Diabetes Father    Hypertension Father    Cancer Father    Lung cancer Father    Diabetes Other     CURRENT MEDICATIONS:  Outpatient Encounter Medications as of 12/20/2020  Medication Sig   acetaminophen (TYLENOL) 325 MG tablet Take 650 mg by mouth every 6 (six) hours as needed for mild pain.    albuterol (VENTOLIN HFA) 108 (90 Base) MCG/ACT inhaler Inhale 2 puffs into the lungs every 4 (four) hours as needed for wheezing or shortness of breath.   doxycycline (VIBRAMYCIN) 100 MG capsule Take 1 capsule (100 mg total) by mouth 2 (two) times daily.   HYDROcodone-acetaminophen (NORCO/VICODIN) 5-325 MG tablet Take 1 tablet by mouth every 6 (six) hours as needed. (Patient not taking: Reported on 11/16/2020)   ipratropium (ATROVENT) 0.03 % nasal spray Place 2 sprays into both nostrils 2 (two) times daily.   loperamide (IMODIUM) 2 MG capsule Take 1 capsule (2 mg total) by mouth 4 (four) times daily as needed for diarrhea or loose stools.   promethazine (PHENERGAN) 25 MG tablet Take 1 tablet (25 mg total) by mouth every 6 (six) hours as needed for nausea or vomiting.   [DISCONTINUED] cetirizine (ZYRTEC) 10 MG tablet Take 1 tablet (10 mg total) by mouth daily. (Patient not taking: Reported on 03/06/2020)   No facility-administered encounter medications on file as of 12/20/2020.    ALLERGIES:  Allergies  Allergen Reactions   Yellow Jacket Venom Anaphylaxis and Swelling   Amoxicillin  Nausea And Vomiting   Celexa [Citalopram] Other (See Comments)    Patient states "makes me go crazy".   Ketorolac Tromethamine Nausea And Vomiting   Penicillins Diarrhea, Itching and Nausea And Vomiting    Has patient had a PCN reaction causing immediate rash, facial/tongue/throat swelling, SOB or lightheadedness with hypotension: Yes Has patient had a PCN reaction causing severe rash involving mucus membranes or skin necrosis: Yes Has patient had a PCN reaction that required hospitalization: Yes Has patient had a PCN reaction occurring within the last 10 years: No If all of the above answers are "NO", then may proceed with Cephalosporin use.    Vistaril [Hydroxyzine Hcl] Hives and Itching     PHYSICAL EXAM:  ECOG PERFORMANCE STATUS: 1 -   Symptomatic but completely ambulatory  There were no vitals filed for this visit. There were no vitals filed for this visit. Physical Exam Constitutional:      Appearance: Normal appearance.  HENT:     Head: Normocephalic and atraumatic.     Mouth/Throat:     Mouth: Mucous membranes are moist.  Eyes:     Extraocular Movements: Extraocular movements intact.     Pupils: Pupils are equal, round, and reactive to light.  Cardiovascular:     Rate and Rhythm: Normal rate and regular rhythm.     Pulses: Normal pulses.     Heart sounds: Normal heart sounds.  Pulmonary:     Effort: Pulmonary effort is normal.     Breath sounds: Normal breath sounds.  Chest:  Breasts:    Right: No axillary adenopathy or supraclavicular adenopathy.     Left: No axillary adenopathy or supraclavicular adenopathy.  Abdominal:     General: Bowel sounds are normal.     Palpations: Abdomen is soft.     Tenderness: There is abdominal tenderness.     Comments: Abdomen slightly distended, but soft.  Tender to palpation in right lower quadrant, epigastric region, and left upper quadrant.  Unable to appreciate spleen on exam.  Musculoskeletal:        General: No swelling.      Right lower leg: No edema.     Left lower leg: No edema.  Lymphadenopathy:     Head:     Right side of head: No submental, submandibular, tonsillar, preauricular, posterior auricular or occipital adenopathy.     Left side of head: No submental, submandibular, tonsillar, preauricular, posterior auricular or occipital adenopathy.     Cervical: No cervical adenopathy.     Right cervical: No superficial, deep or posterior cervical adenopathy.    Left cervical: No superficial, deep or posterior cervical adenopathy.     Upper Body:     Right upper body: No supraclavicular or axillary adenopathy.     Left upper body: No supraclavicular or axillary adenopathy.     Lower Body: No right inguinal adenopathy. No left inguinal adenopathy.  Skin:    General: Skin is warm and dry.  Neurological:     General: No focal deficit present.     Mental Status: She is alert and oriented to person, place, and time.  Psychiatric:        Mood and Affect: Mood normal.        Behavior: Behavior normal.     LABORATORY DATA:  I have reviewed the labs as listed.  CBC    Component Value Date/Time   WBC 10.3 12/19/2020 1451   RBC 4.46 12/19/2020 1451   HGB 13.4 12/19/2020 1451   HCT 39.9 12/19/2020 1451   PLT 141 (L) 12/19/2020 1451   MCV 89.5 12/19/2020 1451   MCH 30.0 12/19/2020 1451   MCHC 33.6 12/19/2020 1451   RDW 14.3 12/19/2020 1451   LYMPHSABS 1.3 12/19/2020 1451   MONOABS 0.4 12/19/2020 1451   EOSABS 0.1 12/19/2020 1451   BASOSABS 0.0 12/19/2020 1451   CMP Latest Ref Rng & Units 12/19/2020 11/09/2020 10/27/2020  Glucose 70 - 99 mg/dL 110(H) 85 94  BUN 6 - 20 mg/dL 7 8 7  Creatinine 0.44 - 1.00 mg/dL 0.64 0.51 0.49  Sodium 135 - 145 mmol/L 137 137 137  Potassium 3.5 - 5.1 mmol/L 3.4(L) 3.7 3.6  Chloride 98 - 111 mmol/L 105 105 106  CO2 22 - 32 mmol/L 26   24 23  Calcium 8.9 - 10.3 mg/dL 8.7(L) 8.6(L) 8.9  Total Protein 6.5 - 8.1 g/dL 7.1 7.1 7.0  Total Bilirubin 0.3 - 1.2 mg/dL 0.7 0.6 0.4   Alkaline Phos 38 - 126 U/L 78 91 76  AST 15 - 41 U/L 12(L) 11(L) 10(L)  ALT 0 - 44 U/L _0 DIAGNOSTIC IMAGING:  I have independently reviewed the relevant imaging and discussed with the patient.  ASSESSMENT & PLAN: 1.  Splenomegaly - Presented to ED on 10/22/2020 and 10/27/2020 due to left upper quadrant abdominal pain x1 week, nausea, and vomiting, with fever 100.4 F.  Found to have splenomegaly increased from previous. - Prior CT (01/10/2012) shows mild splenomegaly with an estimated volume of 420 cc, with next CT (10/27/2014) showing splenic volume 478 mL - CT in ED (10/22/2020): Splenomegaly with 17.0 cm craniocaudal with transverse dimensions 9.8 x 8.6 cm, splenic volume of 750 mL - No evidence of liver disease on CT scans - Given length of time for spleen to grow, most likely represents a benign or indolent condition - Nevertheless, full work-up initiated at last appointment: LDH normal (138); normal ESR and CRP Flow cytometry (11/09/2020): No monoclonal B-cell population or significant T-cell abnormalities identified CBC with mild thrombocytopenia otherwise unremarkable and CMP unremarkable Prior labs (2018) showed negative hepatitis B, HIV.  No hepatitis C on file. - No lymphadenopathy on exam  - ROS positive for B symptoms (chills, night sweats, unintentional weight loss) as well as early satiety and LUQ abdominal pain - PLAN: We will check CMV, EBV, rheumatoid factor, ANA, hepatitis B, hepatitis C, HIV, and JAK2 V617F with reflex.  Due to splenomegaly with B symptoms, will check PET scan.  RTC after PET scan for MD visit.  2.  Thrombocytopenia - Mild thrombocytopenia noted on CBC (12/19/2020): With platelets 141 - Secondary to splenomegaly - PLAN: Continue monitoring with CBC.  We will check vitamin B12, methylmalonic acid, folate, copper.   PLAN SUMMARY & DISPOSITION: -Labs today - PET scan - MD visit with Dr. Delton Coombes after PET scan  All questions were answered. The  patient knows to call the clinic with any problems, questions or concerns.  Medical decision making: Moderate  Time spent on visit: I spent 20 minutes counseling the patient face to face. The total time spent in the appointment was 30 minutes and more than 50% was on counseling.   Harriett Rush, PA-C  12/20/2020 11:23 AM

## 2020-12-20 ENCOUNTER — Other Ambulatory Visit (HOSPITAL_COMMUNITY): Payer: Self-pay | Admitting: Physician Assistant

## 2020-12-20 ENCOUNTER — Inpatient Hospital Stay (HOSPITAL_COMMUNITY): Payer: Medicaid Other

## 2020-12-20 ENCOUNTER — Inpatient Hospital Stay (HOSPITAL_BASED_OUTPATIENT_CLINIC_OR_DEPARTMENT_OTHER): Payer: Medicaid Other | Admitting: Physician Assistant

## 2020-12-20 ENCOUNTER — Encounter (HOSPITAL_COMMUNITY): Payer: Self-pay | Admitting: Physician Assistant

## 2020-12-20 VITALS — BP 123/83 | HR 88 | Temp 97.3°F | Resp 18

## 2020-12-20 DIAGNOSIS — R161 Splenomegaly, not elsewhere classified: Secondary | ICD-10-CM | POA: Diagnosis not present

## 2020-12-20 DIAGNOSIS — D696 Thrombocytopenia, unspecified: Secondary | ICD-10-CM

## 2020-12-20 LAB — HEPATITIS B CORE ANTIBODY, TOTAL: Hep B Core Total Ab: NONREACTIVE

## 2020-12-20 LAB — HIV ANTIBODY (ROUTINE TESTING W REFLEX): HIV Screen 4th Generation wRfx: NONREACTIVE

## 2020-12-20 LAB — HEPATITIS C ANTIBODY: HCV Ab: NONREACTIVE

## 2020-12-20 LAB — VITAMIN B12: Vitamin B-12: 169 pg/mL — ABNORMAL LOW (ref 180–914)

## 2020-12-20 LAB — FOLATE: Folate: 3.6 ng/mL — ABNORMAL LOW (ref >5.9)

## 2020-12-20 LAB — HEPATITIS B SURFACE ANTIBODY,QUALITATIVE: Hep B S Ab: REACTIVE — AB

## 2020-12-20 LAB — HEPATITIS B SURFACE ANTIGEN: Hepatitis B Surface Ag: NONREACTIVE

## 2020-12-20 NOTE — Patient Instructions (Signed)
[facility] at [facility] Discharge Instructions  You were seen today by Rojelio Brenner PA-C for your enlarged spleen ("splenomegaly").  We do not yet know the cause of your enlarged spleen, but we are ordering additional lab tests today.  We will also check a PET scan to evaluate for any potential types of cancer.  Continue to follow-up with gastroenterology (GI) for further tests and treatment of your abdominal pain and difficulty swallowing.  LABS: Labs today before leaving the hospital  OTHER TESTS: PET scan  MEDICATIONS: No changes to home medications  FOLLOW-UP APPOINTMENT: Office visit with Dr. Ellin Saba after PET scan has been obtained (about 3 to 4 weeks)   Thank you for choosing [facility] at [facility] to provide your oncology and hematology care.  To afford each patient quality time with our provider, please arrive at least 15 minutes before your scheduled appointment time.   If you have a lab appointment with the Cancer Center please come in thru the Main Entrance and check in at the main information desk.  You need to re-schedule your appointment should you arrive 10 or more minutes late.  We strive to give you quality time with our providers, and arriving late affects you and other patients whose appointments are after yours.  Also, if you no show three or more times for appointments you may be dismissed from the clinic at the providers discretion.     Again, thank you for choosing [facility].  Our hope is that these requests will decrease the amount of time that you wait before being seen by our physicians.       _____________________________________________________________  Should you have questions after your visit to [facility], please contact our office at [phone] and follow the prompts.  Our office hours are 8:00 a.m. and 4:30 p.m. Monday - Friday.  Please note that voicemails left  after 4:00 p.m. may not be returned until the following business day.  We are closed weekends and major holidays.  You do have access to a nurse 24-7, just call the main number to the clinic [phone] and do not press any options, hold on the line and a nurse will answer the phone.    For prescription refill requests, have your pharmacy contact our office and allow 72 hours.    Due to Covid, you will need to wear a mask upon entering the hospital. If you do not have a mask, a mask will be given to you at the Main Entrance upon arrival. For doctor visits, patients may have 1 support person age 22 or older with them. For treatment visits, patients can not have anyone with them due to social distancing guidelines and our immunocompromised population.

## 2020-12-21 LAB — EBV AB TO VIRAL CAPSID AG PNL, IGG+IGM
EBV VCA IgG: 319 U/mL — ABNORMAL HIGH (ref 0.0–17.9)
EBV VCA IgM: <36 U/mL (ref 0.0–35.9)

## 2020-12-21 LAB — RHEUMATOID FACTOR: Rheumatoid fact SerPl-aCnc: <10 IU/mL (ref <14.0)

## 2020-12-22 LAB — COPPER, SERUM: Copper: 88 ug/dL (ref 80–158)

## 2020-12-22 LAB — ANA: Anti Nuclear Antibody (ANA): NEGATIVE

## 2020-12-23 LAB — METHYLMALONIC ACID, SERUM: Methylmalonic Acid, Quantitative: 272 nmol/L (ref 0–378)

## 2020-12-25 ENCOUNTER — Encounter (HOSPITAL_COMMUNITY): Payer: Self-pay

## 2020-12-28 ENCOUNTER — Encounter (HOSPITAL_COMMUNITY): Payer: Medicaid Other

## 2020-12-29 LAB — CALR + JAK2 E12-15 + MPL (REFLEXED)

## 2020-12-29 LAB — JAK2 V617F, W REFLEX TO CALR/E12/MPL

## 2021-01-03 ENCOUNTER — Ambulatory Visit (HOSPITAL_COMMUNITY): Payer: Medicaid Other | Admitting: Hematology

## 2021-01-04 ENCOUNTER — Other Ambulatory Visit: Payer: Self-pay

## 2021-01-04 ENCOUNTER — Encounter (HOSPITAL_COMMUNITY)
Admission: RE | Admit: 2021-01-04 | Discharge: 2021-01-04 | Disposition: A | Discharge status: Home / Self Care | Payer: Medicaid Other | Source: Ambulatory Visit | Attending: Physician Assistant | Admitting: Physician Assistant

## 2021-01-04 DIAGNOSIS — R161 Splenomegaly, not elsewhere classified: Secondary | ICD-10-CM | POA: Insufficient documentation

## 2021-01-04 MED ORDER — FLUDEOXYGLUCOSE F - 18 (FDG) INJECTION
7.7700 | Freq: Once | INTRAVENOUS | Status: AC | PRN
  Administered 2021-01-04: 7.77 via INTRAVENOUS

## 2021-01-10 NOTE — Progress Notes (Signed)
[facility] [address] [place], [place] [address]   CLINIC:  Medical Oncology/Hematology  PCP:  Practice, [facility] [address] / [place] [id number]  [phone]  REASON FOR VISIT:  Follow-up for splenomegaly  PRIOR THERAPY: none  CURRENT THERAPY: surveillance  INTERVAL HISTORY:  [name], a 30 y.o. female, returns for routine follow-up for her splenomegaly. Brittany Payne was last seen on 12/20/2020 by Rojelio Brennerebekah Pennington, PA-C.  Today she reports feeling okay. She reports pain on the left side of her abdomen underneath her rib that has been present for a year, but has recently worsened. She reports chills, fevers, and fatigue. She has lost 20 lbs over 6 months. Her paternal aunt had breast cancer, and her paternal uncle had lung cancer. She has not been working since April; previously she worked at [employer] . She smokes less than 1 ppd.  REVIEW OF SYSTEMS:  Review of Systems  Constitutional:  Positive for appetite change (40%), chills, fatigue (20%), fever and unexpected weight change (-20 lbs).  HENT:   Positive for trouble swallowing.   Respiratory:  Positive for cough.   Cardiovascular:  Positive for palpitations.  Gastrointestinal:  Positive for abdominal pain (7/10 L side under rib) and nausea.  Neurological:  Positive for headaches and numbness (L arm).  All other systems reviewed and are negative.  PAST MEDICAL/SURGICAL HISTORY:  Past Medical History:  Diagnosis Date   Asthma    Bipolar 1 disorder (HCC)    Depression    Past Surgical History:  Procedure Laterality Date   CESAREAN SECTION N/A    CHOLECYSTECTOMY     HERNIA REPAIR     MYRINGOTOMY     TUBAL LIGATION      SOCIAL HISTORY:  Social History   Socioeconomic History   Marital status: Single    Spouse name: Not on file   Number of children: 4   Years of education: Not on file   Highest education level: Not on file  Occupational History   Not on file  Tobacco Use    Smoking status: Former    Packs/day: 0.50    Years: 8.00    Pack years: 4.00    Types: Cigarettes    Quit date: 12/21/2015    Years since quitting: 5.0   Smokeless tobacco: Never  Vaping Use   Vaping Use: Never used  Substance and Sexual Activity   Alcohol use: No   Drug use: Not Currently    Types: Marijuana   Sexual activity: Yes    Birth control/protection: None  Other Topics Concern   Not on file  Social History Narrative   Not on file   Social Determinants of Health   Financial Resource Strain: Low Risk    Difficulty of Paying Living Expenses: Not hard at all  Food Insecurity: No Food Insecurity   Worried About Programme researcher, broadcasting/film/videounning Out of Food in the Last Year: Never true   Ran Out of Food in the Last Year: Never true  Transportation Needs: No Transportation Needs   Lack of Transportation (Medical): No   Lack of Transportation (Non-Medical): No  Physical Activity: Sufficiently Active   Days of Exercise per Week: 7 days   Minutes of Exercise per Session: 60 min  Stress: No Stress Concern Present   Feeling of Stress : Not at all  Social Connections: Moderately Integrated   Frequency of Communication with Friends and Family: More than three times a week   Frequency of Social  Gatherings with Friends and Family: Once a week   Attends Religious Services: More than 4 times per year   Active Member of [employer] or Organizations: No   Attends Engineer, structural: Never   Marital Status: Living with partner  Intimate Partner Violence: Not on file    FAMILY HISTORY:  Family History  Problem Relation Age of Onset   Hypertension Mother    Diabetes Father    Hypertension Father    Cancer Father    Lung cancer Father    Diabetes Other     CURRENT MEDICATIONS:  Current Outpatient Medications  Medication Sig Dispense Refill   acetaminophen (TYLENOL) 325 MG tablet Take 650 mg by mouth every 6 (six) hours as needed for mild pain.      albuterol (VENTOLIN HFA) 108 (90 Base)  MCG/ACT inhaler Inhale 2 puffs into the lungs every 4 (four) hours as needed for wheezing or shortness of breath.     doxycycline (VIBRAMYCIN) 100 MG capsule Take 1 capsule (100 mg total) by mouth 2 (two) times daily. 20 capsule 0   HYDROcodone-acetaminophen (NORCO/VICODIN) 5-325 MG tablet Take 1 tablet by mouth every 6 (six) hours as needed. (Patient not taking: Reported on 12/20/2020) 10 tablet 0   ipratropium (ATROVENT) 0.03 % nasal spray Place 2 sprays into both nostrils 2 (two) times daily. 30 mL 0   loperamide (IMODIUM) 2 MG capsule Take 1 capsule (2 mg total) by mouth 4 (four) times daily as needed for diarrhea or loose stools. 12 capsule 0   promethazine (PHENERGAN) 25 MG tablet Take 1 tablet (25 mg total) by mouth every 6 (six) hours as needed for nausea or vomiting. 30 tablet 0   No current facility-administered medications for this visit.    ALLERGIES:  Allergies  Allergen Reactions   Yellow Jacket Venom Anaphylaxis and Swelling   Amoxicillin Nausea And Vomiting   Celexa [Citalopram] Other (See Comments)    Patient states "makes me go crazy".   Ketorolac Tromethamine Nausea And Vomiting   Penicillins Diarrhea, Itching and Nausea And Vomiting    Has patient had a PCN reaction causing immediate rash, facial/tongue/throat swelling, SOB or lightheadedness with hypotension: Yes Has patient had a PCN reaction causing severe rash involving mucus membranes or skin necrosis: Yes Has patient had a PCN reaction that required hospitalization: Yes Has patient had a PCN reaction occurring within the last 10 years: No If all of the above answers are "NO", then may proceed with Cephalosporin use.    Vistaril [Hydroxyzine Hcl] Hives and Itching    PHYSICAL EXAM:  Performance status (ECOG): 1 - Symptomatic but completely ambulatory  There were no vitals filed for this visit. Wt Readings from Last 3 Encounters:  11/16/20 167 lb (75.8 kg)  10/27/20 185 lb (83.9 kg)  10/22/20 185 lb (83.9 kg)    Physical Exam Vitals reviewed.  Constitutional:      Appearance: Normal appearance.  Cardiovascular:     Rate and Rhythm: Normal rate and regular rhythm.     Pulses: Normal pulses.     Heart sounds: Normal heart sounds.  Pulmonary:     Effort: Pulmonary effort is normal.     Breath sounds: Normal breath sounds.  Abdominal:     Palpations: Abdomen is soft. There is no hepatomegaly, splenomegaly or mass.     Tenderness: There is abdominal tenderness (3 finger widths below left costal margin) in the left upper quadrant.  Neurological:     General: No focal deficit  present.     Mental Status: She is alert and oriented to person, place, and time.  Psychiatric:        Mood and Affect: Mood normal.        Behavior: Behavior normal.    LABORATORY DATA:  I have reviewed the labs as listed.  CBC Latest Ref Rng & Units 12/19/2020 11/09/2020 10/27/2020  WBC 4.0 - 10.5 K/uL 10.3 9.6 8.0  Hemoglobin 12.0 - 15.0 g/dL 78.9 38.1 01.7  Hematocrit 36.0 - 46.0 % 39.9 39.4 37.5  Platelets 150 - 400 K/uL 141(L) 174 126(L)   CMP Latest Ref Rng & Units 12/19/2020 11/09/2020 10/27/2020  Glucose 70 - 99 mg/dL 510(C) 85 94  BUN 6 - 20 mg/dL 7 8 7   Creatinine 0.44 - 1.00 mg/dL 5.85 2.77  Sodium 135 - 145 mmol/L 137 137 137  Potassium 3.5 - 5.1 mmol/L 3.4(L) 3.7 3.6  Chloride 98 - 111 mmol/L 105 105 106  CO2 22 - 32 mmol/L 26 24 23   Calcium 8.9 - 10.3 mg/dL 8.24) ) 8.9  Total Protein 6.5 - 8.1 g/dL 7.1 7.1 7.0  Total Bilirubin 0.3 - 1.2 mg/dL 0.7 0.6 0.4  Alkaline Phos 38 - 126 U/L 78 91 76  AST 15 - 41 U/L 12(L) 11(L) 10(L)  ALT 0 - 44 U/L 9 10 9       Component Value Date/Time   RBC 4.46 12/19/2020 1451   MCV 89.5 12/19/2020 1451   MCH 30.0 12/19/2020 1451   MCHC 33.6 12/19/2020 1451   RDW 14.3 12/19/2020 1451   LYMPHSABS 1.3 12/19/2020 1451   MONOABS 0.4 12/19/2020 1451   EOSABS 0.1 12/19/2020 1451   BASOSABS 0.0 12/19/2020 1451    DIAGNOSTIC IMAGING:  I have independently  reviewed the scans and discussed with the patient. NM PET Image Initial (PI) Skull Base To Thigh  Result Date: 01/05/2021 CLINICAL DATA:  Initial treatment strategy for splenomegaly. B symptoms. Concern for lymphoma. EXAM: NUCLEAR MEDICINE PET SKULL BASE TO THIGH TECHNIQUE: 7.8 mCi F-18 FDG was injected intravenously. Full-ring PET imaging was performed from the skull base to thigh after the radiotracer. CT data was obtained and used for attenuation correction and anatomic localization. Fasting blood glucose: 69 mg/dl COMPARISON:  CT 02/18/2021 FINDINGS: Mediastinal blood pool activity: SUV max 2.3 Liver activity: SUV max 2.6 NECK: No hypermetabolic lymph nodes in the neck. Incidental CT findings: none CHEST: No hypermetabolic mediastinal or hilar nodes. No suspicious pulmonary nodules on the CT scan. Incidental CT findings: Pulmonary nodule in the RIGHT middle lobe measures 4 mm and without metabolic activity. There is diffuse ground-glass opacities in the lungs without focality. ABDOMEN/PELVIS: Spleen measures 10.7 x 6.4 x 16.0 cm (volume = 570 cm^3) compared with 11.0 x 6.3 x 16.7 cm (volume = 610 cm^3); no significant interval change. Spleen has normal physiologic metabolic activity equal to physiologic liver activity (SUV max equal 2.3). No abnormal metabolic activity liver. No upper abdominal or pelvic hypermetabolic lymph nodes. Incidental CT findings: No enlarged lymph nodes in the abdomen or pelvis. SKELETON: No focal hypermetabolic activity to suggest skeletal metastasis. Incidental CT findings: none IMPRESSION: 1. No evidence of lymphoma on  FDG PET scan. 2. Elongated spleen with normal metabolic activity. Mild splenomegaly. 3. No lymphadenopathy 4. Small benign-appearing RIGHT upper lobe nodule. 5. Diffuse ground-glass opacities suggest atelectasis. Electronically Signed   By: 02/18/2021 M.D.   On: 01/05/2021 14:04   LONG TERM MONITOR (3-14 DAYS)  Result Date: 12/15/2020 1. NSR  with sinus  brady and sinus tachycardia 2. Rare PVC's and PAC's 3. No VT or SVT 4. No atrial fib 5. No prolonged pauses 6. Noise artifact is present. 7. Diary entry of palpitations, chest pain and sob occurs during sinus tachycardia. Dietrich Pates, MD Patch Wear Time:  13 days and 16 hours (2022-07-07T11:07:49-0400 to 2022-07-21T04:07:15-0400) Patient had a min HR of 49 bpm, max HR of 155 bpm, and avg HR of 87 bpm. Predominant underlying rhythm was Sinus Rhythm. Isolated SVEs were rare (<1.0%), and no SVE Couplets or SVE Triplets were present. Isolated VEs were rare (<1.0%), and no VE Couplets  or VE Triplets were present. Occasional change in QRS axis possibly due to positional changes.    ASSESSMENT:  1.  Splenomegaly: - Presentation to the ER with abdominal pain and weight loss.  She reported getting sick in April with tiredness, chills and fevers.  She also reported 25 pound weight loss in the last 6 months. - CTAP on 10/22/2020 showed splenomegaly 17 cm craniocaudal (volume of 750 cm), 15.4 cm on CT scan 10/27/2014 (volume 478 mL), mild splenomegaly with spleen volume (420 cc) on 01/10/2012. - PET CT scan on 01/04/2021 with spleen measuring 10.7 x 6.4 x 16 cm (volume 570 cm) compared with 11 x 6.3 x 16.7 (volume 610 cm) on 10/22/2020 when remeasured. - Work-up including ANA and rheumatoid factor was negative.  EBV VCA IgG was elevated at 319.  VCA IgM was less than 36.  HIV and JAK2 V617F reflex testing was negative.  2.  Social/family history: - She has not been working since April 2022 when she started feeling sick. - She used to work at [employer].  She smokes about 3 to 4 cigarettes/day. - Paternal aunt had breast cancer.  Paternal uncle had lung cancer.   PLAN:  1.  Progressive splenomegaly: - She was noted to have progressive splenomegaly since 2016.  She reported left upper quadrant tenderness and pain for more than a year.  Physical examination today revealed slight tenderness in the left upper quadrant,  2 to 3 fingerbreadths below the left costal cartilage with vaguely palpable spleen. - We discussed PET scan findings from 01/04/2021 which did not show any evidence of lymphoma.  Elongated spleen with normal metabolic activity. - Splenic size and volume have slightly improved when remeasured comparing with CT scan from 10/22/2020. - EBV serology associated with past infection.  LDH was normal. - We will reevaluate her in 3 months with repeat labs including EBV serology.  2.  Low vitamin B12 and folic acid levels: - She was told to start on vitamin B12 and folic acid tablets daily.  We will plan to check at next visit.  3.  Weight loss: - She had 25 pound weight loss in the last 6 months. - PET scan did not show any evidence of lymphoma or other malignancies.   Orders placed this encounter:  No orders of the defined types were placed in this encounter.  Total time spent is 30 minutes with more than 50% of the time spent face-to-face discussing and reviewing previous scans, current scan results, lab results, counseling and coordination of care.  Doreatha Massed, MD [facility] [phone]   I, Alda Ponder, am acting as a scribe for Dr. Doreatha Massed.  I, Doreatha Massed MD, have reviewed the above documentation for accuracy and completeness, and I agree with the above.

## 2021-01-11 ENCOUNTER — Other Ambulatory Visit: Payer: Self-pay

## 2021-01-11 ENCOUNTER — Inpatient Hospital Stay (HOSPITAL_COMMUNITY): Payer: Medicaid Other | Attending: Hematology and Oncology | Admitting: Hematology

## 2021-01-11 VITALS — BP 122/76 | HR 86 | Temp 97.8°F | Resp 18 | Wt 162.0 lb

## 2021-01-11 DIAGNOSIS — R161 Splenomegaly, not elsewhere classified: Secondary | ICD-10-CM | POA: Diagnosis not present

## 2021-01-11 DIAGNOSIS — D696 Thrombocytopenia, unspecified: Secondary | ICD-10-CM

## 2021-01-11 DIAGNOSIS — R634 Abnormal weight loss: Secondary | ICD-10-CM | POA: Diagnosis not present

## 2021-01-11 DIAGNOSIS — F17210 Nicotine dependence, cigarettes, uncomplicated: Secondary | ICD-10-CM | POA: Insufficient documentation

## 2021-01-11 NOTE — Patient Instructions (Addendum)
[facility] at [facility] Discharge Instructions  You were seen today by Dr. Ellin Saba. He went over your recent results and scans. Purchase 1 mg Folic Acid tablets over the counter and take 1 tablet a day. Dr. Ellin Saba will see you back in 3 months for labs and follow up.   Thank you for choosing [facility] at [facility] to provide your oncology and hematology care.  To afford each patient quality time with our provider, please arrive at least 15 minutes before your scheduled appointment time.   If you have a lab appointment with the Cancer Center please come in thru the Main Entrance and check in at the main information desk  You need to re-schedule your appointment should you arrive 10 or more minutes late.  We strive to give you quality time with our providers, and arriving late affects you and other patients whose appointments are after yours.  Also, if you no show three or more times for appointments you may be dismissed from the clinic at the providers discretion.     Again, thank you for choosing [facility].  Our hope is that these requests will decrease the amount of time that you wait before being seen by our physicians.       _____________________________________________________________  Should you have questions after your visit to [facility], please contact our office at [phone] between the hours of 8:00 a.m. and 4:30 p.m.  Voicemails left after 4:00 p.m. will not be returned until the following business day.  For prescription refill requests, have your pharmacy contact our office and allow 72 hours.    Cancer Center Support Programs:   > Cancer Support Group  2nd Tuesday of the month 1pm-2pm, Journey Room

## 2021-01-16 ENCOUNTER — Encounter (HOSPITAL_COMMUNITY): Payer: Self-pay

## 2021-02-14 ENCOUNTER — Encounter (HOSPITAL_COMMUNITY): Payer: Self-pay | Admitting: Emergency Medicine

## 2021-02-14 ENCOUNTER — Other Ambulatory Visit: Payer: Self-pay

## 2021-02-14 ENCOUNTER — Emergency Department (HOSPITAL_COMMUNITY): Payer: Medicaid Other

## 2021-02-14 ENCOUNTER — Emergency Department (HOSPITAL_COMMUNITY)
Admission: EM | Admit: 2021-02-14 | Discharge: 2021-02-15 | Disposition: A | Discharge status: Home / Self Care | Payer: Medicaid Other | Attending: Emergency Medicine | Admitting: Emergency Medicine

## 2021-02-14 DIAGNOSIS — R1012 Left upper quadrant pain: Secondary | ICD-10-CM | POA: Diagnosis not present

## 2021-02-14 DIAGNOSIS — F17210 Nicotine dependence, cigarettes, uncomplicated: Secondary | ICD-10-CM | POA: Diagnosis not present

## 2021-02-14 DIAGNOSIS — R109 Unspecified abdominal pain: Secondary | ICD-10-CM | POA: Diagnosis present

## 2021-02-14 DIAGNOSIS — J45909 Unspecified asthma, uncomplicated: Secondary | ICD-10-CM | POA: Diagnosis not present

## 2021-02-14 DIAGNOSIS — R1110 Vomiting, unspecified: Secondary | ICD-10-CM | POA: Diagnosis not present

## 2021-02-14 DIAGNOSIS — Z7951 Long term (current) use of inhaled steroids: Secondary | ICD-10-CM | POA: Diagnosis not present

## 2021-02-14 DIAGNOSIS — R161 Splenomegaly, not elsewhere classified: Secondary | ICD-10-CM

## 2021-02-14 LAB — URINALYSIS, ROUTINE W REFLEX MICROSCOPIC
Bilirubin Urine: NEGATIVE
Glucose, UA: NEGATIVE mg/dL
Hgb urine dipstick: NEGATIVE
Ketones, ur: 5 mg/dL — AB
Leukocytes,Ua: NEGATIVE
Nitrite: NEGATIVE
Protein, ur: 30 mg/dL — AB
Specific Gravity, Urine: 1.025 (ref 1.005–1.030)
pH: 6 (ref 5.0–8.0)

## 2021-02-14 LAB — CBC
HCT: 41.7 % (ref 36.0–46.0)
Hemoglobin: 13.8 g/dL (ref 12.0–15.0)
MCH: 29.5 pg (ref 26.0–34.0)
MCHC: 33.1 g/dL (ref 30.0–36.0)
MCV: 89.1 fL (ref 80.0–100.0)
Platelets: 179 10*3/uL (ref 150–400)
RBC: 4.68 MIL/uL (ref 3.87–5.11)
RDW: 15.4 % (ref 11.5–15.5)
WBC: 7.6 10*3/uL (ref 4.0–10.5)
nRBC: 0 % (ref 0.0–0.2)

## 2021-02-14 LAB — COMPREHENSIVE METABOLIC PANEL
ALT: 14 U/L (ref 0–44)
AST: 12 U/L — ABNORMAL LOW (ref 15–41)
Albumin: 4.3 g/dL (ref 3.5–5.0)
Alkaline Phosphatase: 90 U/L (ref 38–126)
Anion gap: 8 (ref 5–15)
BUN: 8 mg/dL (ref 6–20)
CO2: 27 mmol/L (ref 22–32)
Calcium: 8.9 mg/dL (ref 8.9–10.3)
Chloride: 102 mmol/L (ref 98–111)
Creatinine, Ser: 0.65 mg/dL (ref 0.44–1.00)
GFR, Estimated: >60 mL/min (ref >60)
Glucose, Bld: 94 mg/dL (ref 70–99)
Potassium: 3.6 mmol/L (ref 3.5–5.1)
Sodium: 137 mmol/L (ref 135–145)
Total Bilirubin: 0.7 mg/dL (ref 0.3–1.2)
Total Protein: 7.6 g/dL (ref 6.5–8.1)

## 2021-02-14 LAB — LIPASE, BLOOD: Lipase: 31 U/L (ref 11–51)

## 2021-02-14 LAB — POC URINE PREG, ED: Preg Test, Ur: NEGATIVE

## 2021-02-14 MED ORDER — ONDANSETRON HCL 4 MG/2ML IJ SOLN
4.0000 mg | Freq: Once | INTRAMUSCULAR | Status: AC
  Administered 2021-02-14: 4 mg via INTRAVENOUS
  Filled 2021-02-14: qty 2

## 2021-02-14 MED ORDER — SODIUM CHLORIDE 0.9 % IV BOLUS
1000.0000 mL | Freq: Once | INTRAVENOUS | Status: AC
  Administered 2021-02-14: 1000 mL via INTRAVENOUS

## 2021-02-14 MED ORDER — IOHEXOL 300 MG/ML  SOLN
100.0000 mL | Freq: Once | INTRAMUSCULAR | Status: AC | PRN
  Administered 2021-02-14: 100 mL via INTRAVENOUS

## 2021-02-14 MED ORDER — MORPHINE SULFATE (PF) 4 MG/ML IV SOLN
4.0000 mg | Freq: Once | INTRAVENOUS | Status: AC
  Administered 2021-02-14: 4 mg via INTRAVENOUS
  Filled 2021-02-14: qty 1

## 2021-02-14 NOTE — ED Triage Notes (Signed)
Pt c/o left flank pain with n/v since yesterday.

## 2021-02-14 NOTE — ED Provider Notes (Signed)
[facility] EMERGENCY DEPARTMENT Provider Note   CSN: [id number] Arrival date & time: 02/14/21  2042     History Chief Complaint  Patient presents with   Emesis    Brittany Payne is a 30 y.o. female.  Patient is a 30 year old female with past medical history of asthma, depression, and bipolar disorder.  Patient also diagnosed in the past with an enlarged spleen of undetermined etiology.  She tells me she is seeing Dr. Ellin Saba for this in the past.  She presents today with complaints of left flank pain.  This started yesterday and is worsening.  She describes a constant pain to her side that makes her feel nauseated and has vomited several times.  She reports decreased p.o. intake since yesterday and her urine is a dark color.  She denies fevers or chills.  She denies any bloody stool.  The history is provided by the patient.      Past Medical History:  Diagnosis Date   Asthma    Bipolar 1 disorder [facility])    Depression     Patient Active Problem List   Diagnosis Date Noted   Umbilical hernia without obstruction and without gangrene 07/26/2019   Positive urine drug screen 12/31/2018   Anemia 02/05/2017   Bipolar disorder (HCC) 02/05/2017   Depression with anxiety 02/05/2017   Enlargement of spleen 02/05/2017   Migraine 02/05/2017    Past Surgical History:  Procedure Laterality Date   CESAREAN SECTION N/A    CHOLECYSTECTOMY     HERNIA REPAIR     MYRINGOTOMY     TUBAL LIGATION       OB History     Gravida  3   Para  2   Term  2   Preterm      AB      Living  2      SAB      IAB      Ectopic      Multiple      Live Births              Family History  Problem Relation Age of Onset   Hypertension Mother    Diabetes Father    Hypertension Father    Cancer Father    Lung cancer Father    Diabetes Other     Social History   Tobacco Use   Smoking status: Every Day    Packs/day: 0.50    Years: 8.00    Pack years: 4.00    Types:  Cigarettes    Last attempt to quit: 12/21/2015    Years since quitting: 5.1   Smokeless tobacco: Never  Vaping Use   Vaping Use: Never used  Substance Use Topics   Alcohol use: No   Drug use: Not Currently    Types: Marijuana    Home Medications Prior to Admission medications   Medication Sig Start Date End Date Taking? Authorizing Provider  acetaminophen (TYLENOL) 325 MG tablet Take 650 mg by mouth every 6 (six) hours as needed for mild pain.     [provider]  albuterol (VENTOLIN HFA) 108 (90 Base) MCG/ACT inhaler Inhale 2 puffs into the lungs every 4 (four) hours as needed for wheezing or shortness of breath.    [provider]  HYDROcodone-acetaminophen (NORCO/VICODIN) 5-325 MG tablet Take 1 tablet by mouth every 6 (six) hours as needed. 10/27/20   Burgess Amor, PA-C  ipratropium (ATROVENT) 0.03 % nasal spray Place 2 sprays into both  nostrils 2 (two) times daily. 07/10/20   Bing Neighbors, FNP  loperamide (IMODIUM) 2 MG capsule Take 1 capsule (2 mg total) by mouth 4 (four) times daily as needed for diarrhea or loose stools. 10/27/20   Idol, Raynelle Fanning, PA-C  promethazine (PHENERGAN) 25 MG tablet Take 1 tablet (25 mg total) by mouth every 6 (six) hours as needed for nausea or vomiting. Patient not taking: Reported on 01/11/2021 10/27/20   Burgess Amor, PA-C  rosuvastatin (CRESTOR) 10 MG tablet Take 10 mg by mouth. 11/27/20   [provider]  cetirizine (ZYRTEC) 10 MG tablet Take 1 tablet (10 mg total) by mouth daily. Patient not taking: Reported on 03/06/2020 09/07/19 03/16/20  Pauline Aus, PA-C    Allergies    Yellow jacket venom, Amoxicillin, Celexa [citalopram], Penicillins, and Vistaril [hydroxyzine hcl]  Review of Systems   Review of Systems  All other systems reviewed and are negative.  Physical Exam Updated Vital Signs BP 130/77 (BP Location: Right Arm)   Pulse 95   Temp 99 F (37.2 C)   Resp 18   Ht 5\' 2"  (1.575 m)   Wt 74.4 kg   LMP  12/25/2020   SpO2 99%   BMI 30.00 kg/m   Physical Exam Vitals and nursing note reviewed.  Constitutional:      General: She is not in acute distress.    Appearance: She is well-developed. She is not diaphoretic.  HENT:     Head: Normocephalic and atraumatic.  Cardiovascular:     Rate and Rhythm: Normal rate and regular rhythm.     Heart sounds: No murmur heard.   No friction rub. No gallop.  Pulmonary:     Effort: Pulmonary effort is normal. No respiratory distress.     Breath sounds: Normal breath sounds. No wheezing.  Abdominal:     General: Bowel sounds are normal. There is no distension.     Palpations: Abdomen is soft.     Tenderness: There is abdominal tenderness. There is left CVA tenderness. There is no right CVA tenderness, guarding or rebound.     Comments: There is tenderness to palpation in the left upper quadrant.  Musculoskeletal:        General: Normal range of motion.     Cervical back: Normal range of motion and neck supple.  Skin:    General: Skin is warm and dry.  Neurological:     General: No focal deficit present.     Mental Status: She is alert and oriented to person, place, and time.    ED Results / Procedures / Treatments   Labs (all labs ordered are listed, but only abnormal results are displayed) Labs Reviewed  COMPREHENSIVE METABOLIC PANEL - Abnormal; Notable for the following components:      Result Value   AST 12 (*)    All other components within normal limits  LIPASE, BLOOD  CBC  URINALYSIS, ROUTINE W REFLEX MICROSCOPIC  POC URINE PREG, ED    EKG None  Radiology No results found.  Procedures Procedures   Medications Ordered in ED Medications  sodium chloride 0.9 % bolus 1,000 mL (has no administration in time range)  morphine 4 MG/ML injection 4 mg (has no administration in time range)  ondansetron (ZOFRAN) injection 4 mg (has no administration in time range)  iohexol (OMNIPAQUE) 300 MG/ML solution 100 mL (has no  administration in time range)    ED Course  I have reviewed the triage vital signs and the nursing notes.  Pertinent labs & imaging results that were available during my care of the patient were reviewed by me and considered in my medical decision making (see chart for details).    MDM Rules/Calculators/A&P  Patient with history of splenomegaly, unexplained presenting with complaints of left flank and left upper quadrant pain.  Patient's laboratory studies are unremarkable and urinalysis is clear.  I did obtain a CT scan due to her history of enlarged spleen.  This should stable splenomegaly with no complications or other etiology for her pain identified.  At this point, patient seems appropriate for discharge with follow-up with Dr. Ellin Saba.  Final Clinical Impression(s) / ED Diagnoses Final diagnoses:  None    Rx / DC Orders ED Discharge Orders     None        Geoffery Lyons, MD 02/15/21 [phone]

## 2021-02-15 MED ORDER — TRAMADOL HCL 50 MG PO TABS: 50.0000 mg | ORAL_TABLET | Freq: Four times a day (QID) | ORAL | 0 refills | Status: DC | PRN

## 2021-02-15 NOTE — Discharge Instructions (Addendum)
Begin taking tramadol as prescribed.  Follow-up with Dr. Ellin Saba in the next week, and return to the ER if symptoms significantly worsen or change.

## 2021-02-17 NOTE — Progress Notes (Signed)
[facility] [address], [place] [address]   CLINIC:  Medical Oncology/Hematology  PCP:  P[facility], [facility] [address] [place] / [place] [address]  [phone]  REASON FOR VISIT:  Follow-up for splenomegaly  PRIOR THERAPY: none  CURRENT THERAPY: surveillance  INTERVAL HISTORY:  [name], a 30 y.o. female, returns for routine follow-up for her splenomegaly. Tuyet was last seen on 01/11/2021.  Today she reports feeling good. She presented to the ED last week for left abdominal pain following lifting her son which has improved with tramadol prn. She reports night sweats and chills; she also reports irregular menses. Since her last visit she has unintentionally lost 5 pounds. She denies fevers and infections.   REVIEW OF SYSTEMS:  Review of Systems  Constitutional:  Positive for appetite change (40%), chills, fatigue (25%) and unexpected weight change (-5 lbs). Negative for fever.  Gastrointestinal:  Positive for abdominal pain (improved), constipation, diarrhea, nausea and vomiting.  Genitourinary:  Positive for hematuria and menstrual problem (irregular).   Musculoskeletal:  Positive for flank pain (5/10 L side).  Neurological:  Positive for dizziness and numbness (in left hand and fingers).  Psychiatric/Behavioral:  Positive for depression. The patient is nervous/anxious.   All other systems reviewed and are negative.  PAST MEDICAL/SURGICAL HISTORY:  Past Medical History:  Diagnosis Date   Asthma    Bipolar 1 disorder (HCC)    Depression    Past Surgical History:  Procedure Laterality Date   CESAREAN SECTION N/A    CHOLECYSTECTOMY     HERNIA REPAIR     MYRINGOTOMY     TUBAL LIGATION      SOCIAL HISTORY:  Social History   Socioeconomic History   Marital status: Single    Spouse name: Not on file   Number of children: 4   Years of education: Not on file   Highest education level: Not on file  Occupational History   Not on  file  Tobacco Use   Smoking status: Every Day    Packs/day: 0.50    Years: 8.00    Pack years: 4.00    Types: Cigarettes    Last attempt to quit: 12/21/2015    Years since quitting: 5.1   Smokeless tobacco: Never  Vaping Use   Vaping Use: Never used  Substance and Sexual Activity   Alcohol use: No   Drug use: Not Currently    Types: Marijuana   Sexual activity: Yes    Birth control/protection: None  Other Topics Concern   Not on file  Social History Narrative   Not on file   Social Determinants of Health   Financial Resource Strain: Low Risk    Difficulty of Paying Living Expenses: Not hard at all  Food Insecurity: No Food Insecurity   Worried About Programme researcher, broadcasting/film/video in the Last Year: Never true   Ran Out of Food in the Last Year: Never true  Transportation Needs: No Transportation Needs   Lack of Transportation (Medical): No   Lack of Transportation (Non-Medical): No  Physical Activity: Sufficiently Active   Days of Exercise per Week: 7 days   Minutes of Exercise per Session: 60 min  Stress: No Stress Concern Present   Feeling of Stress : Not at all  Social Connections: Moderately Integrated   Frequency of Communication with Friends and Family: More than three times a week   Frequency of Social Gatherings with Friends and Family: Once a week  Attends Religious Services: More than 4 times per year   Active Member of Clubs or Organizations: No   Attends Banker Meetings: Never   Marital Status: Living with partner  Intimate Partner Violence: Not on file    FAMILY HISTORY:  Family History  Problem Relation Age of Onset   Hypertension Mother    Diabetes Father    Hypertension Father    Cancer Father    Lung cancer Father    Diabetes Other     CURRENT MEDICATIONS:  Current Outpatient Medications  Medication Sig Dispense Refill   acetaminophen (TYLENOL) 325 MG tablet Take 650 mg by mouth every 6 (six) hours as needed for mild pain.       albuterol (VENTOLIN HFA) 108 (90 Base) MCG/ACT inhaler Inhale 2 puffs into the lungs every 4 (four) hours as needed for wheezing or shortness of breath.     HYDROcodone-acetaminophen (NORCO/VICODIN) 5-325 MG tablet Take 1 tablet by mouth every 6 (six) hours as needed. 10 tablet 0   ipratropium (ATROVENT) 0.03 % nasal spray Place 2 sprays into both nostrils 2 (two) times daily. 30 mL 0   loperamide (IMODIUM) 2 MG capsule Take 1 capsule (2 mg total) by mouth 4 (four) times daily as needed for diarrhea or loose stools. 12 capsule 0   promethazine (PHENERGAN) 25 MG tablet Take 1 tablet (25 mg total) by mouth every 6 (six) hours as needed for nausea or vomiting. (Patient not taking: Reported on 01/11/2021) 30 tablet 0   rosuvastatin (CRESTOR) 10 MG tablet Take 10 mg by mouth.     traMADol (ULTRAM) 50 MG tablet Take 1 tablet (50 mg total) by mouth every 6 (six) hours as needed. 15 tablet 0   No current facility-administered medications for this visit.    ALLERGIES:  Allergies  Allergen Reactions   Yellow Jacket Venom Anaphylaxis and Swelling   Amoxicillin Nausea And Vomiting   Celexa [Citalopram] Other (See Comments)    Patient states "makes me go crazy".   Penicillins Diarrhea, Itching and Nausea And Vomiting    Has patient had a PCN reaction causing immediate rash, facial/tongue/throat swelling, SOB or lightheadedness with hypotension: Yes Has patient had a PCN reaction causing severe rash involving mucus membranes or skin necrosis: Yes Has patient had a PCN reaction that required hospitalization: Yes Has patient had a PCN reaction occurring within the last 10 years: No If all of the above answers are "NO", then may proceed with Cephalosporin use.    Vistaril [Hydroxyzine Hcl] Hives and Itching    PHYSICAL EXAM:  Performance status (ECOG): 1 - Symptomatic but completely ambulatory  There were no vitals filed for this visit. Wt Readings from Last 3 Encounters:  02/14/21 164 lb (74.4 kg)   01/11/21 162 lb 0.6 oz (73.5 kg)  11/16/20 167 lb (75.8 kg)   Physical Exam Vitals reviewed.  Constitutional:      Appearance: Normal appearance.  Cardiovascular:     Rate and Rhythm: Normal rate and regular rhythm.     Pulses: Normal pulses.     Heart sounds: Normal heart sounds.  Pulmonary:     Effort: Pulmonary effort is normal.     Breath sounds: Normal breath sounds.     Comments: hoarse inspiration Abdominal:     Palpations: Abdomen is soft. There is no hepatomegaly, splenomegaly or mass.     Tenderness: There is abdominal tenderness.  Neurological:     General: No focal deficit present.  Mental Status: She is alert and oriented to person, place, and time.  Psychiatric:        Mood and Affect: Mood normal.        Behavior: Behavior normal.    LABORATORY DATA:  I have reviewed the labs as listed.  CBC Latest Ref Rng & Units 02/14/2021 12/19/2020 11/09/2020  WBC 4.0 - 10.5 K/uL 7.6 10.3 9.6  Hemoglobin 12.0 - 15.0 g/dL 10.9 32.3 55.7  Hematocrit 36.0 - 46.0 % 41.7 39.9 39.4  Platelets 150 - 400 K/uL 179 141(L) 174   CMP Latest Ref Rng & Units 02/14/2021 12/19/2020 11/09/2020  Glucose 70 - 99 mg/dL 94 322(G) 85  BUN 6 - 20 mg/dL 8 7 8   Creatinine 0.44 - 1.00 mg/dL 2.54 2.70  Sodium 135 - 145 mmol/L 137 137 137  Potassium 3.5 - 5.1 mmol/L 3.6 3.4(L) 3.7  Chloride 98 - 111 mmol/L 102 105 105  CO2 22 - 32 mmol/L 27 26 24   Calcium 8.9 - 10.3 mg/dL 8.9 6.23) )  Total Protein 6.5 - 8.1 g/dL 7.6 7.1 7.1  Total Bilirubin 0.3 - 1.2 mg/dL 0.7 0.7 0.6  Alkaline Phos 38 - 126 U/L 90 78 91  AST 15 - 41 U/L 12(L) 12(L) 11(L)  ALT 0 - 44 U/L 14 9 10       Component Value Date/Time   RBC 4.68 02/14/2021 2115   MCV 89.1 02/14/2021 2115   MCH 29.5 02/14/2021 2115   MCHC 33.1 02/14/2021 2115   RDW 15.4 02/14/2021 2115   LYMPHSABS 1.3 12/19/2020 1451   MONOABS 0.4 12/19/2020 1451   EOSABS 0.1 12/19/2020 1451   BASOSABS 0.0 12/19/2020 1451    DIAGNOSTIC IMAGING:  I  have independently reviewed the scans and discussed with the patient. CT ABDOMEN PELVIS W CONTRAST  Result Date: 02/15/2021 CLINICAL DATA:  Left flank pain. History of splenomegaly. Evaluate for infection. EXAM: CT ABDOMEN AND PELVIS WITH CONTRAST TECHNIQUE: Multidetector CT imaging of the abdomen and pelvis was performed using the standard protocol following bolus administration of intravenous contrast. CONTRAST:  02/18/2021 OMNIPAQUE IOHEXOL 300 MG/ML  SOLN COMPARISON:  CT abdomen and pelvis 10/22/2020. FINDINGS: Lower chest: There are few nodular densities in the lung bases measuring less than 4 mm, unchanged. The lung bases are otherwise clear. Hepatobiliary: No focal liver abnormality is seen. Status post cholecystectomy. No biliary dilatation. Pancreas: Unremarkable. No pancreatic ductal dilatation or surrounding inflammatory changes. Spleen: Spleen is enlarged measuring 17 cm and craniocaudad dimension, unchanged from the prior examination. Adrenals/Urinary Tract: There are few cortical hypodensities in the kidneys which are too small to characterize, most likely cysts. The kidneys, adrenal glands, and bladder are otherwise within normal limits. Stomach/Bowel: Stomach is within normal limits. Appendix appears normal. No evidence of bowel wall thickening, distention, or inflammatory changes. Appendicoliths are present. Vascular/Lymphatic: No significant vascular findings are present. No enlarged abdominal or pelvic lymph nodes. Reproductive: Corpus lutein cyst noted in the left ovary. Right ovary and uterus are within normal limits. Other: No abdominal wall hernia or abnormality. No abdominopelvic ascites. Musculoskeletal: No acute or significant osseous findings. IMPRESSION: 1. No acute localizing process in the abdomen or pelvis. 2. Stable splenomegaly. 3. Small left ovarian simple-appearing cyst. No follow-up imaging is recommended. Reference: JACR 2020 Feb;17(2):248-254 Electronically Signed   By: M.D.   On: 02/15/2021 00:00     ASSESSMENT:  1.  Splenomegaly: - Presentation to the ER with abdominal pain and weight loss.  She reported  getting sick in April with tiredness, chills and fevers.  She also reported 25 pound weight loss in the last 6 months. - CTAP on 10/22/2020 showed splenomegaly 17 cm craniocaudal (volume of 750 cm), 15.4 cm on CT scan 10/27/2014 (volume 478 mL), mild splenomegaly with spleen volume (420 cc) on 01/10/2012. - PET CT scan on 01/04/2021 with spleen measuring 10.7 x 6.4 x 16 cm (volume 570 cm) compared with 11 x 6.3 x 16.7 (volume 610 cm) on 10/22/2020 when remeasured. - Work-up including ANA and rheumatoid factor was negative.  EBV VCA IgG was elevated at 319.  VCA IgM was less than 36.  HIV and JAK2 V617F reflex testing was negative.  2.  Social/family history: - She has not been working since April 2022 when she started feeling sick. - She used to work at [employer].  She smokes about 3 to 4 cigarettes/day. - Paternal aunt had breast cancer.  Paternal uncle had lung cancer.   PLAN:   1.  Progressive splenomegaly: - She was noted to have progressive splenomegaly since 2016. - PET scan on 01/04/2021-spleen size and volume slightly improved compared to CT scan from 10/22/2020. - She went to the ER on 02/14/2021 as she developed left upper quadrant pain when she was lifting her son. - I reviewed CTAP with contrast from 02/14/2021 which showed spleen measuring 17 cm craniocaudal unchanged from prior exam.  No splenic infarct was seen.  Small left ovarian simple appearing cyst. - Reviewed labs from from ER visit which showed normal CBC and CMP. - She reports improvement in the left upper quadrant pain.  She is taking Ultram if the pain is severe. - She was told to not to lift any heavy weights. - She will have EBV serology done prior to next visit.  2.  Low vitamin B12 and folic acid levels: - Continue vitamin B12 and folic acid tablets daily.  We will check  in December.  3.  Weight loss: - She had a history of 25 pound weight loss in the last 6 months. - Last PET scan did not show any evidence of lymphoma or other malignancies.  Orders placed this encounter:  No orders of the defined types were placed in this encounter.    Doreatha Massed, MD [facility] [phone]   I, Alda Ponder, am acting as a scribe for Dr. Doreatha Massed.  I, Doreatha Massed MD, have reviewed the above documentation for accuracy and completeness, and I agree with the above.

## 2021-02-19 ENCOUNTER — Other Ambulatory Visit: Payer: Self-pay

## 2021-02-19 ENCOUNTER — Inpatient Hospital Stay (HOSPITAL_COMMUNITY): Payer: Medicaid Other | Attending: Hematology and Oncology | Admitting: Hematology

## 2021-02-19 VITALS — BP 113/81 | HR 86 | Temp 97.0°F | Resp 17 | Wt 157.4 lb

## 2021-02-19 DIAGNOSIS — R161 Splenomegaly, not elsewhere classified: Secondary | ICD-10-CM | POA: Diagnosis not present

## 2021-02-19 DIAGNOSIS — R634 Abnormal weight loss: Secondary | ICD-10-CM | POA: Diagnosis not present

## 2021-02-19 DIAGNOSIS — D696 Thrombocytopenia, unspecified: Secondary | ICD-10-CM | POA: Diagnosis not present

## 2021-02-19 NOTE — Patient Instructions (Signed)
[facility] Discharge Instructions  You were seen and examined today by Dr. Katragadda. Please follow up as scheduled.    Thank you for choosing [facility] at [facility] to provide your oncology and hematology care.  To afford each patient quality time with our provider, please arrive at least 15 minutes before your scheduled appointment time.   If you have a lab appointment with the Cancer Center please come in thru the Main Entrance and check in at the main information desk.  You need to re-schedule your appointment should you arrive 10 or more minutes late.  We strive to give you quality time with our providers, and arriving late affects you and other patients whose appointments are after yours.  Also, if you no show three or more times for appointments you may be dismissed from the clinic at the providers discretion.     Again, thank you for choosing [facility].  Our hope is that these requests will decrease the amount of time that you wait before being seen by our physicians.       _____________________________________________________________  Should you have questions after your visit to [facility], please contact our office at [phone] and follow the prompts.  Our office hours are 8:00 a.m. and 4:30 p.m. Monday - Friday.  Please note that voicemails left after 4:00 p.m. may not be returned until the following business day.  We are closed weekends and major holidays.  You do have access to a nurse 24-7, just call the main number to the clinic [phone] and do not press any options, hold on the line and a nurse will answer the phone.    For prescription refill requests, have your pharmacy contact our office and allow 72 hours.    Due to Covid, you will need to wear a mask upon entering the hospital. If you do not have a mask, a mask will be given to you at the Main Entrance upon arrival. For  doctor visits, patients may have 1 support person age 18 or older with them. For treatment visits, patients can not have anyone with them due to social distancing guidelines and our immunocompromised population.      

## 2021-03-01 ENCOUNTER — Encounter (HOSPITAL_COMMUNITY): Payer: Self-pay

## 2021-03-02 ENCOUNTER — Other Ambulatory Visit (HOSPITAL_COMMUNITY): Payer: Self-pay | Admitting: *Deleted

## 2021-03-05 MED ORDER — TRAMADOL HCL 50 MG PO TABS: 50.0000 mg | ORAL_TABLET | Freq: Four times a day (QID) | ORAL | 0 refills | Status: DC | PRN

## 2021-03-06 ENCOUNTER — Encounter (HOSPITAL_COMMUNITY): Payer: Self-pay | Admitting: *Deleted

## 2021-03-08 ENCOUNTER — Encounter (HOSPITAL_COMMUNITY): Payer: Self-pay | Admitting: *Deleted

## 2021-03-08 ENCOUNTER — Emergency Department (HOSPITAL_COMMUNITY): Payer: Medicaid Other

## 2021-03-08 ENCOUNTER — Emergency Department (HOSPITAL_COMMUNITY)
Admission: EM | Admit: 2021-03-08 | Discharge: 2021-03-08 | Disposition: A | Discharge status: Home / Self Care | Payer: Medicaid Other | Attending: Emergency Medicine | Admitting: Emergency Medicine

## 2021-03-08 DIAGNOSIS — J3489 Other specified disorders of nose and nasal sinuses: Secondary | ICD-10-CM | POA: Diagnosis not present

## 2021-03-08 DIAGNOSIS — B349 Viral infection, unspecified: Secondary | ICD-10-CM | POA: Diagnosis not present

## 2021-03-08 DIAGNOSIS — F17210 Nicotine dependence, cigarettes, uncomplicated: Secondary | ICD-10-CM | POA: Insufficient documentation

## 2021-03-08 DIAGNOSIS — J4520 Mild intermittent asthma, uncomplicated: Secondary | ICD-10-CM | POA: Insufficient documentation

## 2021-03-08 DIAGNOSIS — R509 Fever, unspecified: Secondary | ICD-10-CM | POA: Diagnosis present

## 2021-03-08 DIAGNOSIS — Z20822 Contact with and (suspected) exposure to covid-19: Secondary | ICD-10-CM | POA: Insufficient documentation

## 2021-03-08 LAB — RESP PANEL BY RT-PCR (FLU A&B, COVID) ARPGX2
Influenza A by PCR: NEGATIVE
Influenza B by PCR: NEGATIVE
SARS Coronavirus 2 by RT PCR: NEGATIVE

## 2021-03-08 LAB — I-STAT BETA HCG BLOOD, ED (MC, WL, AP ONLY): I-stat hCG, quantitative: <5 m[IU]/mL (ref <5)

## 2021-03-08 MED ORDER — IPRATROPIUM-ALBUTEROL 0.5-2.5 (3) MG/3ML IN SOLN: 3.0000 mL | Freq: Four times a day (QID) | RESPIRATORY_TRACT | 0 refills | Status: DC | PRN

## 2021-03-08 MED ORDER — IPRATROPIUM-ALBUTEROL 0.5-2.5 (3) MG/3ML IN SOLN
3.0000 mL | Freq: Once | RESPIRATORY_TRACT | Status: AC
  Administered 2021-03-08: 3 mL via RESPIRATORY_TRACT
  Filled 2021-03-08: qty 3

## 2021-03-08 NOTE — ED Triage Notes (Signed)
Fever with cough for 3 days

## 2021-03-08 NOTE — ED Notes (Signed)
Patient transported to X-ray 

## 2021-03-08 NOTE — Discharge Instructions (Signed)
You were seen here today for evaluation of cough and cold symptoms.  You had a negative COVID and flu test.  This is most likely a viral illness.  You been prescribed the DuoNeb solution you received today in the emergency department to put in your nebulizer machine.  You can take over-the-counter cough and cold medication for symptoms.  Please return to the emergency department if you have any concern, new or worsening symptoms.

## 2021-03-08 NOTE — ED Provider Notes (Signed)
[facility] EMERGENCY DEPARTMENT Provider Note   CSN: [id number] Arrival date & time: 03/08/21  1647     History Chief Complaint  Patient presents with   Fever    Brittany Payne is a 30 y.o. female history of asthma presents emergency department for evaluation of breath, headache, sore throat, cough, wheezing, nasal congestion, rhinorrhea, fever T-max 101.7 for the past 2 days.  Patient ports her fever broke last night.  She has some chest pain with coughing.  She has tested herself for COVID and resulted negative.  Patient reports her fever broke last night and she has not had a fever at all today.  She has tried Advertising account planner, nebulizer treatment, and Lawyer without relief.  No exacerbating symptoms.  Patient reports she is vaccinated for COVID, but not the flu.  Medical history includes asthma and a large spleen.  Surgical history includes cholecystectomy, multiple hernia, C-section, TM tubesx2.  Allergic to penicillin.  Daily medication includes tramadol and albuterol.  Fever Associated symptoms: congestion, cough, rhinorrhea and sore throat   Associated symptoms: no chest pain, no chills, no diarrhea, no dysuria, no ear pain, no myalgias, no nausea, no rash and no vomiting       Past Medical History:  Diagnosis Date   Asthma    Bipolar 1 disorder (HCC)    Depression     Patient Active Problem List   Diagnosis Date Noted   Umbilical hernia without obstruction and without gangrene 07/26/2019   Positive urine drug screen 12/31/2018   Anemia 02/05/2017   Bipolar disorder (HCC) 02/05/2017   Depression with anxiety 02/05/2017   Enlargement of spleen 02/05/2017   Migraine 02/05/2017    Past Surgical History:  Procedure Laterality Date   CESAREAN SECTION N/A    CHOLECYSTECTOMY     HERNIA REPAIR     MYRINGOTOMY     TUBAL LIGATION       OB History     Gravida  3   Para  2   Term  2   Preterm      AB      Living  2      SAB      IAB      Ectopic       Multiple      Live Births              Family History  Problem Relation Age of Onset   Hypertension Mother    Diabetes Father    Hypertension Father    Cancer Father    Lung cancer Father    Diabetes Other     Social History   Tobacco Use   Smoking status: Every Day    Packs/day: 0.50    Years: 8.00    Pack years: 4.00    Types: Cigarettes    Last attempt to quit: 12/21/2015    Years since quitting: 5.2   Smokeless tobacco: Never  Vaping Use   Vaping Use: Never used  Substance Use Topics   Alcohol use: No   Drug use: Not Currently    Types: Marijuana    Home Medications Prior to Admission medications   Medication Sig Start Date End Date Taking? Authorizing Provider  ipratropium-albuterol (DUONEB) 0.5-2.5 (3) MG/3ML SOLN Take 3 mLs by nebulization every 6 (six) hours as needed. 03/08/21  Yes Achille Rich, PA-C  acetaminophen (TYLENOL) 325 MG tablet Take 650 mg by mouth every 6 (six) hours as needed for mild pain.  Patient  not taking: Reported on 02/19/2021    [provider]  albuterol (VENTOLIN HFA) 108 (90 Base) MCG/ACT inhaler Inhale 2 puffs into the lungs every 4 (four) hours as needed for wheezing or shortness of breath. Patient not taking: Reported on 02/19/2021    [provider]  HYDROcodone-acetaminophen (NORCO/VICODIN) 5-325 MG tablet Take 1 tablet by mouth every 6 (six) hours as needed. 10/27/20   Burgess Amor, PA-C  ipratropium (ATROVENT) 0.03 % nasal spray Place 2 sprays into both nostrils 2 (two) times daily. 07/10/20   Bing Neighbors, FNP  loperamide (IMODIUM) 2 MG capsule Take 1 capsule (2 mg total) by mouth 4 (four) times daily as needed for diarrhea or loose stools. Patient not taking: Reported on 02/19/2021 10/27/20   Burgess Amor, PA-C  promethazine (PHENERGAN) 25 MG tablet Take 1 tablet (25 mg total) by mouth every 6 (six) hours as needed for nausea or vomiting. Patient not taking: Reported on 02/19/2021 10/27/20   Burgess Amor,  PA-C  rosuvastatin (CRESTOR) 10 MG tablet Take 10 mg by mouth. 11/27/20   [provider]  traMADol (ULTRAM) 50 MG tablet Take 1 tablet (50 mg total) by mouth every 6 (six) hours as needed. 03/05/21   Doreatha Massed, MD  cetirizine (ZYRTEC) 10 MG tablet Take 1 tablet (10 mg total) by mouth daily. Patient not taking: Reported on 03/06/2020 09/07/19 03/16/20  Pauline Aus, PA-C    Allergies    Yellow jacket venom, Amoxicillin, Celexa [citalopram], Penicillins, and Vistaril [hydroxyzine hcl]  Review of Systems   Review of Systems  Constitutional:  Positive for fever. Negative for chills.  HENT:  Positive for congestion, rhinorrhea and sore throat. Negative for ear discharge, ear pain, sinus pressure, sinus pain and trouble swallowing.   Eyes:  Negative for pain and visual disturbance.  Respiratory:  Positive for cough, shortness of breath and wheezing.   Cardiovascular:  Negative for chest pain and palpitations.  Gastrointestinal:  Negative for abdominal pain, constipation, diarrhea, nausea and vomiting.  Genitourinary:  Negative for dysuria and hematuria.  Musculoskeletal:  Negative for arthralgias, back pain and myalgias.  Skin:  Negative for color change and rash.  Neurological:  Negative for seizures and syncope.  All other systems reviewed and are negative.  Physical Exam Updated Vital Signs BP 121/87 (BP Location: Right Arm)   Pulse 76   Temp 98.2 F (36.8 C) (Oral)   Resp 18   LMP 02/22/2021 (Approximate)   SpO2 98%   Physical Exam Vitals and nursing note reviewed.  Constitutional:      General: She is not in acute distress.    Appearance: Normal appearance. She is not toxic-appearing.  HENT:     Head: Normocephalic and atraumatic.     Right Ear: Ear canal and external ear normal.     Left Ear: Ear canal and external ear normal.     Ears:     Comments: Scarring to bilateral TMs, possibly due to TM tubes placed in her childhood.  External ear canal  normal.  No evidence of infection.  No mastoid tenderness.    Nose:     Comments: Bilateral naris erythematous and edematous with scant clear nasal discharge.    Mouth/Throat:     Mouth: Mucous membranes are moist.     Pharynx: Oropharynx is clear. No oropharyngeal exudate or posterior oropharyngeal erythema.     Comments: Uvula midline.  Airway patent.  Dentures in place.  No pharyngeal erythema, lesion, or exudate noted to the area.  No pharyngeal edema. Eyes:     General: No scleral icterus.    Conjunctiva/sclera: Conjunctivae normal.  Cardiovascular:     Rate and Rhythm: Normal rate and regular rhythm.  Pulmonary:     Effort: Pulmonary effort is normal. No respiratory distress.     Breath sounds: Wheezing present.     Comments: Fine inspiratory wheezing throughout lung fields and coarse expiratory wheezing in bilateral lower bases.  No respiratory distress, tripoding, accessory muscle use, nasal flaring, or cyanosis noted.  Patient speaking full sentences with ease. Abdominal:     General: Abdomen is flat. Bowel sounds are normal.     Palpations: Abdomen is soft.     Tenderness: There is no guarding or rebound.  Musculoskeletal:        General: No deformity.     Cervical back: Normal range of motion. No rigidity.  Lymphadenopathy:     Cervical: No cervical adenopathy.  Skin:    General: Skin is warm and dry.  Neurological:     General: No focal deficit present.     Mental Status: She is alert. Mental status is at baseline.    ED Results / Procedures / Treatments   Labs (all labs ordered are listed, but only abnormal results are displayed) Labs Reviewed  RESP PANEL BY RT-PCR (FLU A&B, COVID) [id number]    EKG None  Radiology DG Chest 2 View  Result Date: 03/08/2021 CLINICAL DATA:  Wheezing and cough. EXAM: CHEST - 2 VIEW COMPARISON:  Chest x-ray 10/27/2020. FINDINGS: The heart size and mediastinal contours are within normal limits. Both lungs are clear. The visualized  skeletal structures are unremarkable. IMPRESSION: No active cardiopulmonary disease. Electronically Signed   By: Darliss Cheney M.D.   On: 03/08/2021 18:31    Procedures Procedures   Medications Ordered in ED Medications  ipratropium-albuterol (DUONEB) 0.5-2.5 (3) MG/3ML nebulizer solution 3 mL (3 mLs Nebulization Given 03/08/21 1926)    ED Course  I have reviewed the triage vital signs and the nursing notes.  Pertinent labs & imaging results that were available during my care of the patient were reviewed by me and considered in my medical decision making (see chart for details).  Patient presents with URI symptoms.  On exam patient has bilateral inspiratory and expiratory wheezing.  No respiratory distress.  O2 sat 100%.  Will order DuoNeb and chest x-ray.  COVID test pending.  I personally reviewed and interpreted the patient's labs and imaging.  Chest x-ray normal.  No pneumothorax or pneumonia visualized.  Patient negative for COVID and flu.  1 DuoNeb given.  On reevaluation, patient reports she is feeling much better.  No wheezing audible externally.  Clear to auscultation bilaterally.  Patient reports she would like a prescription for the breathing treatment she got today.  Recommended over-the-counter cough and cold medication, Robitussin-DM for treatment of symptoms.  Advised against Mucinex as an combination with albuterol can cause palpitations.  Strict return precautions given the patient.  Patient agrees and plan.  Patient is stable being discharged home in good condition.    MDM Rules/Calculators/A&P                          Final Clinical Impression(s) / ED Diagnoses Final diagnoses:  Viral illness  Mild intermittent asthma, unspecified whether complicated    Rx / DC Orders ED Discharge Orders          Ordered    ipratropium-albuterol (DUONEB) 0.5-2.5 (3) MG/3ML  SOLN  Every 6 hours PRN        03/08/21 1958             Achille Rich, Cordelia Poche 03/08/21 2000     Cathren Laine, MD 03/10/21 1539

## 2021-03-18 NOTE — Progress Notes (Signed)
Cardiology Office Note   Date:  03/19/2021   ID:  Brittany Payne, DOB 1990-12-21, MRN [id number]  PCP:  [facility]  Cardiologist:   Dietrich Pates, MD    Pt presents on return for evaluation of palpitations.   History of Present Illness: Brittany Payne is a 30 y.o. female with a history of bipolar disorder, asthma,  LUQ pain, shoulder pain   Began abou tapril   Occur about 2 to 3x per day   Can occur with waking    But also with moving    Last about 5 to 15 min before easing   No SOB   Not positional     Patient also notes some heart racing    No dizziness The pt was seen in the [facility] ED for this on 10/27/20  Compalined also of some N/V, diarrhea  ALos back then of low grade fever   Exam at that point she was tender in LUQ  CT of the abdomen was done whch showed splenomegaly (vol 750cm3)  Atherosclerosis of aorta also noted    DUe to this the pt was referred to cardiology    I saw the pt in July 2022 Since seen she had and echo done and an event monitor Echo was normal  Event monitor showed no arrhythmias  Since seen she still has some LUQ pain    She stopped the Crestor due to achiness Still has fluttering in her chest, particularly around the time of her period    Current Meds  Medication Sig   albuterol (VENTOLIN HFA) 108 (90 Base) MCG/ACT inhaler Inhale 2 puffs into the lungs every 4 (four) hours as needed for wheezing or shortness of breath.   atorvastatin (LIPITOR) 10 MG tablet Take 1 tablet (10 mg total) by mouth every other day.   ipratropium (ATROVENT) 0.03 % nasal spray Place 2 sprays into both nostrils 2 (two) times daily.   ipratropium-albuterol (DUONEB) 0.5-2.5 (3) MG/3ML SOLN Take 3 mLs by nebulization every 6 (six) hours as needed.   metoprolol succinate (TOPROL XL) 25 MG 24 hr tablet Take 0.5 tablets (12.5 mg total) by mouth as directed.   traMADol (ULTRAM) 50 MG tablet Take 1 tablet (50 mg total) by mouth every 6 (six) hours as needed.    [DISCONTINUED] rosuvastatin (CRESTOR) 10 MG tablet Take 10 mg by mouth.     Allergies:   Yellow jacket venom, Amoxicillin, Celexa [citalopram], Penicillins, and Vistaril [hydroxyzine hcl]   Past Medical History:  Diagnosis Date   Asthma    Bipolar 1 disorder (HCC)    Depression     Past Surgical History:  Procedure Laterality Date   CESAREAN SECTION N/A    CHOLECYSTECTOMY     HERNIA REPAIR     MYRINGOTOMY     TUBAL LIGATION       Social History:  The patient  reports that she has been smoking cigarettes. She has a 4.00 pack-year smoking history. She has never used smokeless tobacco. She reports that she does not currently use drugs after having used the following drugs: Marijuana. She reports that she does not drink alcohol.   Family History:  The patient's family history includes Cancer in her father; Diabetes in her father and another family member; Hypertension in her father and mother; Lung cancer in her father.    ROS:  Please see the history of present illness. All other systems are reviewed and  Negative to the above problem except as  noted.    PHYSICAL EXAM: VS:  BP 104/70   Pulse 84   Ht 5\' 2"  (1.575 m)   Wt 159 lb (72.1 kg)   LMP 02/22/2021 (Approximate)   SpO2 99%   BMI 29.08 kg/m   GEN: Overweight 30 yo  in no acute distress  HEENT: normal  Neck: no JVD, carotid bruits, Cardiac: RRR; no murmurs, No LE edema  Respiratory:  clear to auscultation bilaterally GI: soft, nontender, nondistended, + BS  No hepatomegaly  MS: no deformity Moving all extremities   Skin: warm and dry, no rash Neuro:  Strength and sensation are intact Psych: euthymic mood, full affect   EKG:  EKG is  not ordered today.    Echo      Monitor  CT abdomen     IMPRESSION: 1. No acute process in the abdomen or pelvis. 2. Right ovarian corpus luteal cyst. Trace right adnexal and less so cul-de-sac fluid, likely secondary to recent cyst rupture. 3. Splenomegaly, progressive  compared to 2016. 4. Aortic Atherosclerosis (ICD10-I70.0).   Lipid Panel No results found for: CHOL, TRIG, HDL, CHOLHDL, VLDL, LDLCALC, LDLDIRECT    Wt Readings from Last 3 Encounters:  03/19/21 159 lb (72.1 kg)  02/19/21 157 lb 6.4 oz (71.4 kg)  02/14/21 164 lb (74.4 kg)      ASSESSMENT AND PLAN:  1    Heart racing   Monitro was negative for arrhythmias  Senses more at time of menses   Will give Rx for Toprol 12.5 XL to be used around time of cycle   2  Atherosclerosis   CT with plaquing.  LP(a) was low.  Lipomata actually was not done.  Patient did not tolerate Crestor.  We will try lipitor 10 qod get  lpids in8 wks     3 Splenomegaly  Seen by hematology   Slowly progressive   Work up in progress.   Plan for follow-up later this winter.  Current medicines are reviewed at length with the patient today.  The patient does not have concerns regarding medicines.  Signed, 07-29-1987, MD  03/19/2021 2:40 PM    [facility] [address] [place], [place], [place]  [place] Phone: [phone]; Fax: [phone]

## 2021-03-19 ENCOUNTER — Encounter: Payer: Self-pay | Admitting: Internal Medicine

## 2021-03-19 ENCOUNTER — Other Ambulatory Visit: Payer: Self-pay

## 2021-03-19 ENCOUNTER — Ambulatory Visit (INDEPENDENT_AMBULATORY_CARE_PROVIDER_SITE_OTHER): Payer: Medicaid Other | Admitting: Internal Medicine

## 2021-03-19 VITALS — BP 104/70 | HR 84 | Ht 62.0 in | Wt 159.0 lb

## 2021-03-19 DIAGNOSIS — R002 Palpitations: Secondary | ICD-10-CM

## 2021-03-19 MED ORDER — METOPROLOL SUCCINATE ER 25 MG PO TB24
12.5000 mg | ORAL_TABLET | ORAL | 11 refills | Status: DC
Start: 2021-03-19 — End: 2022-04-29

## 2021-03-19 MED ORDER — ATORVASTATIN CALCIUM 10 MG PO TABS: 10.0000 mg | ORAL_TABLET | ORAL | 3 refills | Status: DC

## 2021-03-19 NOTE — Patient Instructions (Signed)
Medication Instructions:   Take Lipitor 10 mg Every Other Day  Toprol XL 12.5 mg As Directed   *If you need a refill on your cardiac medications before your next appointment, please call your pharmacy*   Lab Work: NONE   If you have labs (blood work) drawn today and your tests are completely normal, you will receive your results only by: MyChart Message (if you have MyChart) OR A paper copy in the mail If you have any lab test that is abnormal or we need to change your treatment, we will call you to review the results.   Testing/Procedures: NONE    Follow-Up: At [facility], you and your health needs are our priority.  As part of our continuing mission to provide you with exceptional heart care, we have created designated Provider Care Teams.  These Care Teams include your primary Cardiologist (physician) and Advanced Practice Providers (APPs -  Physician Assistants and Nurse Practitioners) who all work together to provide you with the care you need, when you need it.  We recommend signing up for the patient portal called "MyChart".  Sign up information is provided on this After Visit Summary.  MyChart is used to connect with patients for Virtual Visits (Telemedicine).  Patients are able to view lab/test results, encounter notes, upcoming appointments, etc.  Non-urgent messages can be sent to your provider as well.   To learn more about what you can do with MyChart, go to [email or web address].    Your next appointment:    February   The format for your next appointment:   In Person  Provider:   Dietrich Pates, MD    Other Instructions Thank you for choosing [facility]!

## 2021-03-22 LAB — FLOW CYTOMETRY

## 2021-04-12 ENCOUNTER — Inpatient Hospital Stay (HOSPITAL_COMMUNITY): Payer: Medicaid Other | Attending: Hematology

## 2021-04-12 DIAGNOSIS — D696 Thrombocytopenia, unspecified: Secondary | ICD-10-CM | POA: Diagnosis present

## 2021-04-12 DIAGNOSIS — F17210 Nicotine dependence, cigarettes, uncomplicated: Secondary | ICD-10-CM | POA: Diagnosis not present

## 2021-04-12 DIAGNOSIS — Z79899 Other long term (current) drug therapy: Secondary | ICD-10-CM | POA: Insufficient documentation

## 2021-04-12 DIAGNOSIS — R161 Splenomegaly, not elsewhere classified: Secondary | ICD-10-CM

## 2021-04-12 LAB — COMPREHENSIVE METABOLIC PANEL
ALT: 11 U/L (ref 0–44)
AST: 12 U/L — ABNORMAL LOW (ref 15–41)
Albumin: 4.2 g/dL (ref 3.5–5.0)
Alkaline Phosphatase: 75 U/L (ref 38–126)
Anion gap: 7 (ref 5–15)
BUN: 9 mg/dL (ref 6–20)
CO2: 24 mmol/L (ref 22–32)
Calcium: 9 mg/dL (ref 8.9–10.3)
Chloride: 107 mmol/L (ref 98–111)
Creatinine, Ser: 0.65 mg/dL (ref 0.44–1.00)
GFR, Estimated: >60 mL/min (ref >60)
Glucose, Bld: 73 mg/dL (ref 70–99)
Potassium: 3.8 mmol/L (ref 3.5–5.1)
Sodium: 138 mmol/L (ref 135–145)
Total Bilirubin: 0.3 mg/dL (ref 0.3–1.2)
Total Protein: 7.4 g/dL (ref 6.5–8.1)

## 2021-04-12 LAB — CBC WITH DIFFERENTIAL/PLATELET
Abs Immature Granulocytes: 0.04 10*3/uL (ref 0.00–0.07)
Basophils Absolute: 0 10*3/uL (ref 0.0–0.1)
Basophils Relative: 0 %
Eosinophils Absolute: 0.2 10*3/uL (ref 0.0–0.5)
Eosinophils Relative: 1 %
HCT: 41.4 % (ref 36.0–46.0)
Hemoglobin: 13.9 g/dL (ref 12.0–15.0)
Immature Granulocytes: 0 %
Lymphocytes Relative: 14 %
Lymphs Abs: 1.5 10*3/uL (ref 0.7–4.0)
MCH: 29.5 pg (ref 26.0–34.0)
MCHC: 33.6 g/dL (ref 30.0–36.0)
MCV: 87.9 fL (ref 80.0–100.0)
Monocytes Absolute: 0.5 10*3/uL (ref 0.1–1.0)
Monocytes Relative: 5 %
Neutro Abs: 8.2 10*3/uL — ABNORMAL HIGH (ref 1.7–7.7)
Neutrophils Relative %: 80 %
Platelets: 139 10*3/uL — ABNORMAL LOW (ref 150–400)
RBC: 4.71 MIL/uL (ref 3.87–5.11)
RDW: 14.6 % (ref 11.5–15.5)
WBC: 10.4 10*3/uL (ref 4.0–10.5)
nRBC: 0 % (ref 0.0–0.2)

## 2021-04-12 LAB — FOLATE: Folate: 3.9 ng/mL — ABNORMAL LOW (ref >5.9)

## 2021-04-12 LAB — LACTATE DEHYDROGENASE: LDH: 120 U/L (ref 98–192)

## 2021-04-12 LAB — VITAMIN B12: Vitamin B-12: 281 pg/mL (ref 180–914)

## 2021-04-13 LAB — EBV AB TO VIRAL CAPSID AG PNL, IGG+IGM
EBV VCA IgG: 393 U/mL — ABNORMAL HIGH (ref 0.0–17.9)
EBV VCA IgM: <36 U/mL (ref 0.0–35.9)

## 2021-04-18 LAB — METHYLMALONIC ACID, SERUM: Methylmalonic Acid, Quantitative: 276 nmol/L (ref 0–378)

## 2021-04-19 ENCOUNTER — Other Ambulatory Visit: Payer: Self-pay

## 2021-04-19 ENCOUNTER — Inpatient Hospital Stay (HOSPITAL_BASED_OUTPATIENT_CLINIC_OR_DEPARTMENT_OTHER): Payer: Medicaid Other | Admitting: Physician Assistant

## 2021-04-19 VITALS — BP 106/82 | HR 75 | Temp 98.0°F | Resp 18 | Ht 64.96 in | Wt 159.6 lb

## 2021-04-19 DIAGNOSIS — R1012 Left upper quadrant pain: Secondary | ICD-10-CM | POA: Diagnosis not present

## 2021-04-19 DIAGNOSIS — D696 Thrombocytopenia, unspecified: Secondary | ICD-10-CM | POA: Diagnosis not present

## 2021-04-19 DIAGNOSIS — R161 Splenomegaly, not elsewhere classified: Secondary | ICD-10-CM | POA: Diagnosis not present

## 2021-04-19 MED ORDER — TRAMADOL HCL 50 MG PO TABS: 25.0000 mg | ORAL_TABLET | Freq: Four times a day (QID) | ORAL | 0 refills | Status: DC | PRN

## 2021-04-19 NOTE — Patient Instructions (Addendum)
[facility] at [facility] Discharge Instructions  You were seen today by Rojelio Brenner PA-C for your enlarged spleen and low platelets.  We are not sure of the cause of your enlarged spleen and intermittent fevers.  We have ruled out any malignant cause, so we will refer you to an INFECTIOUS DISEASE DOCTOR to see if there is an infectious cause of your enlarged spleen and other symptoms.  Because of your enlarged spleen, it is important that you avoid lifting heavy objects and do not participate in any high impact activities.  Your platelets remain slightly low, which is also most likely related to your enlarged spleen.  Your B12 level has improved, but your folate is still low.  Please make sure that you are taking daily folic acid supplement or prenatal vitamin.  LABS: Return in 4 months for repeat labs  FOLLOW-UP APPOINTMENT: Office visit in 4 months   Thank you for choosing [facility] at [facility] to provide your oncology and hematology care.  To afford each patient quality time with our provider, please arrive at least 15 minutes before your scheduled appointment time.   If you have a lab appointment with the Cancer Center please come in thru the Main Entrance and check in at the main information desk.  You need to re-schedule your appointment should you arrive 10 or more minutes late.  We strive to give you quality time with our providers, and arriving late affects you and other patients whose appointments are after yours.  Also, if you no show three or more times for appointments you may be dismissed from the clinic at the providers discretion.     Again, thank you for choosing [facility].  Our hope is that these requests will decrease the amount of time that you wait before being seen by our physicians.       _____________________________________________________________  Should you have questions after your visit to  [facility], please contact our office at [phone] and follow the prompts.  Our office hours are 8:00 a.m. and 4:30 p.m. Monday - Friday.  Please note that voicemails left after 4:00 p.m. may not be returned until the following business day.  We are closed weekends and major holidays.  You do have access to a nurse 24-7, just call the main number to the clinic [phone] and do not press any options, hold on the line and a nurse will answer the phone.    For prescription refill requests, have your pharmacy contact our office and allow 72 hours.    Due to Covid, you will need to wear a mask upon entering the hospital. If you do not have a mask, a mask will be given to you at the Main Entrance upon arrival. For doctor visits, patients may have 1 support person age 52 or older with them. For treatment visits, patients can not have anyone with them due to social distancing guidelines and our immunocompromised population.

## 2021-04-19 NOTE — Progress Notes (Addendum)
[place], [place] [address]   CLINIC:  Medical Oncology/Hematology  PCP:  Practice, [facility] [place] [address] [phone]   REASON FOR VISIT:  Follow-up for splenomegaly and thrombocytopenia  PRIOR THERAPY: None  CURRENT THERAPY: Surveillance  INTERVAL HISTORY:  [name] 30 y.o. female returns for routine follow-up of her splenomegaly and thrombocytopenia.  She was last seen by Dr. Delton Coombes on 02/19/2021.  At today's visit, she reports feeling poorly.  No recent hospitalizations, surgeries, or changes in baseline health status.  She continues to have left upper quadrant abdominal pain, which she reports is getting progressively worse, particularly before her menstrual cycle started.  She reports stabbing pains, and says that she is unable to lay on her left side.  Her pain is relieved by tramadol.  She continues to have intermittent night sweats, chills, and fevers.  She reports fever at home of 49 F last week, but was reportedly negative for COVID-19.  She had previously complained of unintentional weight loss, but her weight has been stable for the past 2 to 3 months.  She denies any known sick contacts or tick bites.  She continues to have nausea and early satiety.  No bleeding, bruising, or petechial rash.  She has little to no energy and 50% appetite. She endorses that she is maintaining a stable weight.   REVIEW OF SYSTEMS:  Review of Systems  Constitutional:  Positive for appetite change, chills, diaphoresis, fatigue and fever. Negative for unexpected weight change.  HENT:   Positive for trouble swallowing. Negative for lump/mass and nosebleeds.   Eyes:  Negative for eye problems.  Respiratory:  Positive for cough. Negative for hemoptysis and shortness of breath.   Cardiovascular:  Negative for chest pain, leg swelling and palpitations.  Gastrointestinal:  Positive for abdominal pain. Negative for blood in  stool, constipation, diarrhea, nausea and vomiting.  Genitourinary:  Positive for frequency. Negative for hematuria.   Skin: Negative.   Neurological:  Positive for dizziness and numbness. Negative for headaches and light-headedness.  Hematological:  Does not bruise/bleed easily.  Psychiatric/Behavioral:  Positive for depression and sleep disturbance. The patient is nervous/anxious.      PAST MEDICAL/SURGICAL HISTORY:  Past Medical History:  Diagnosis Date   Asthma    Bipolar 1 disorder ([place])    Depression    Past Surgical History:  Procedure Laterality Date   CESAREAN SECTION N/A    CHOLECYSTECTOMY     HERNIA REPAIR     MYRINGOTOMY     TUBAL LIGATION       SOCIAL HISTORY:  Social History   Socioeconomic History   Marital status: Single    Spouse name: Not on file   Number of children: 4   Years of education: Not on file   Highest education level: Not on file  Occupational History   Not on file  Tobacco Use   Smoking status: Every Day    Packs/day: 0.50    Years: 8.00    Pack years: 4.00    Types: Cigarettes    Last attempt to quit: 12/21/2015    Years since quitting: 5.3   Smokeless tobacco: Never  Vaping Use   Vaping Use: Never used  Substance and Sexual Activity   Alcohol use: No   Drug use: Not Currently    Types: Marijuana   Sexual activity: Yes    Birth control/protection: None  Other Topics Concern   Not on file  Social History Narrative   Not on file   Social Determinants of Health   Financial Resource Strain: Low Risk    Difficulty of Paying Living Expenses: Not hard at all  Food Insecurity: No Food Insecurity   Worried About Charity fundraiser in the Last Year: Never true   Arboriculturist in the Last Year: Never true  Transportation Needs: No Transportation Needs   Lack of Transportation (Medical): No   Lack of Transportation (Non-Medical): No  Physical Activity: Sufficiently Active   Days of Exercise per Week: 7 days   Minutes of  Exercise per Session: 60 min  Stress: No Stress Concern Present   Feeling of Stress : Not at all  Social Connections: Moderately Integrated   Frequency of Communication with Friends and Family: More than three times a week   Frequency of Social Gatherings with Friends and Family: Once a week   Attends Religious Services: More than 4 times per year   Active Member of [employer] or Organizations: No   Attends Music therapist: Never   Marital Status: Living with partner  Intimate Partner Violence: Not on file    FAMILY HISTORY:  Family History  Problem Relation Age of Onset   Hypertension Mother    Diabetes Father    Hypertension Father    Cancer Father    Lung cancer Father    Diabetes Other     CURRENT MEDICATIONS:  Outpatient Encounter Medications as of 04/19/2021  Medication Sig   acetaminophen (TYLENOL) 325 MG tablet Take 650 mg by mouth every 6 (six) hours as needed for mild pain.  (Patient not taking: No sig reported)   albuterol (VENTOLIN HFA) 108 (90 Base) MCG/ACT inhaler Inhale 2 puffs into the lungs every 4 (four) hours as needed for wheezing or shortness of breath.   atorvastatin (LIPITOR) 10 MG tablet Take 1 tablet (10 mg total) by mouth every other day.   HYDROcodone-acetaminophen (NORCO/VICODIN) 5-325 MG tablet Take 1 tablet by mouth every 6 (six) hours as needed.   ipratropium (ATROVENT) 0.03 % nasal spray Place 2 sprays into both nostrils 2 (two) times daily.   ipratropium-albuterol (DUONEB) 0.5-2.5 (3) MG/3ML SOLN Take 3 mLs by nebulization every 6 (six) hours as needed.   loperamide (IMODIUM) 2 MG capsule Take 1 capsule (2 mg total) by mouth 4 (four) times daily as needed for diarrhea or loose stools. (Patient not taking: Reported on 03/19/2021)   metoprolol succinate (TOPROL XL) 25 MG 24 hr tablet Take 0.5 tablets (12.5 mg total) by mouth as directed.   promethazine (PHENERGAN) 25 MG tablet Take 1 tablet (25 mg total) by mouth every 6 (six) hours as needed  for nausea or vomiting. (Patient not taking: No sig reported)   traMADol (ULTRAM) 50 MG tablet Take 1 tablet (50 mg total) by mouth every 6 (six) hours as needed.   [DISCONTINUED] cetirizine (ZYRTEC) 10 MG tablet Take 1 tablet (10 mg total) by mouth daily. (Patient not taking: Reported on 03/06/2020)   No facility-administered encounter medications on file as of 04/19/2021.    ALLERGIES:  Allergies  Allergen Reactions   Yellow Jacket Venom Anaphylaxis and Swelling   Amoxicillin Nausea And Vomiting   Celexa [Citalopram] Other (See Comments)    Patient states "makes me go crazy".   Penicillins Diarrhea, Itching and Nausea And Vomiting    Has patient had a PCN reaction causing immediate rash, facial/tongue/throat swelling, SOB or lightheadedness with hypotension: Yes Has patient had a  PCN reaction causing severe rash involving mucus membranes or skin necrosis: Yes Has patient had a PCN reaction that required hospitalization: Yes Has patient had a PCN reaction occurring within the last 10 years: No If all of the above answers are "NO", then may proceed with Cephalosporin use.    Vistaril [Hydroxyzine Hcl] Hives and Itching     PHYSICAL EXAM:  ECOG PERFORMANCE STATUS: 2 - Symptomatic, <50% confined to bed  There were no vitals filed for this visit. There were no vitals filed for this visit. Physical Exam Constitutional:      Appearance: Normal appearance.  HENT:     Head: Normocephalic and atraumatic.     Mouth/Throat:     Mouth: Mucous membranes are moist.  Eyes:     Extraocular Movements: Extraocular movements intact.     Pupils: Pupils are equal, round, and reactive to light.  Cardiovascular:     Rate and Rhythm: Normal rate and regular rhythm.     Pulses: Normal pulses.     Heart sounds: Normal heart sounds.  Pulmonary:     Effort: Pulmonary effort is normal.     Breath sounds: Normal breath sounds.  Abdominal:     General: Bowel sounds are normal.     Palpations:  Abdomen is soft. There is splenomegaly.     Tenderness: There is abdominal tenderness in the left upper quadrant.  Musculoskeletal:        General: No swelling.     Right lower leg: No edema.     Left lower leg: No edema.  Lymphadenopathy:     Head:     Right side of head: No submental, submandibular, tonsillar, preauricular, posterior auricular or occipital adenopathy.     Left side of head: No submental, submandibular, tonsillar, preauricular, posterior auricular or occipital adenopathy.     Cervical: No cervical adenopathy.     Right cervical: No superficial, deep or posterior cervical adenopathy.    Left cervical: No superficial, deep or posterior cervical adenopathy.     Upper Body:     Right upper body: No supraclavicular or axillary adenopathy.     Left upper body: No supraclavicular or axillary adenopathy.     Lower Body: No right inguinal adenopathy. No left inguinal adenopathy.  Skin:    General: Skin is warm and dry.  Neurological:     General: No focal deficit present.     Mental Status: She is alert and oriented to person, place, and time.  Psychiatric:        Mood and Affect: Mood normal.        Behavior: Behavior normal.     LABORATORY DATA:  I have reviewed the labs as listed.  CBC    Component Value Date/Time   WBC 10.4 04/12/2021 1259   RBC 4.71 04/12/2021 1259   HGB 13.9 04/12/2021 1259   HCT 41.4 04/12/2021 1259   PLT 139 (L) 04/12/2021 1259   MCV 87.9 04/12/2021 1259   MCH 29.5 04/12/2021 1259   MCHC 33.6 04/12/2021 1259   RDW 14.6 04/12/2021 1259   LYMPHSABS 1.5 04/12/2021 1259   MONOABS 0.5 04/12/2021 1259   EOSABS 0.2 04/12/2021 1259   BASOSABS 0.0 04/12/2021 1259   CMP Latest Ref Rng & Units 04/12/2021 02/14/2021 12/19/2020  Glucose 70 - 99 mg/dL 73 94 110(H)  BUN 6 - 20 mg/dL 9 8 7   Creatinine 0.44 - 1.00 mg/dL 0.65 0.65 0.64  Sodium 135 - 145 mmol/L 138 137 137  Potassium  3.5 - 5.1 mmol/L 3.8 3.6 3.4(L)  Chloride 98 - 111 mmol/L 107 102 105   CO2 22 - 32 mmol/L 24 27 26   Calcium 8.9 - 10.3 mg/dL 9.0 8.9 8.7(L)  Total Protein 6.5 - 8.1 g/dL 7.4 7.6 7.1  Total Bilirubin 0.3 - 1.2 mg/dL 0.3 0.7 0.7  Alkaline Phos 38 - 126 U/L 75 90 78  AST 15 - 41 U/L 12(L) 12(L) 12(L)  ALT 0 - 44 U/L 11 14 9     DIAGNOSTIC IMAGING:  I have independently reviewed the relevant imaging and discussed with the patient.  ASSESSMENT & PLAN: 1.  Splenomegaly: - Presentation to the ER on 10/22/2020 with abdominal pain, fever 100.4 F, and weight loss.  She reported getting sick in April with tiredness, chills and fevers.  She also reported 25 pound weight loss in the last 6 months. - Prior CT (01/10/2012) shows mild splenomegaly with an estimated volume of 420 cc, with next CT (10/27/2014) showing splenic volume 478 mL - CT in ED (10/22/2020): Splenomegaly with 17.0 cm craniocaudal with transverse dimensions 9.8 x 8.6 cm, splenic volume of 750 mL - No evidence of liver disease on CT scans - PET CT scan on 01/04/2021 did not show any evidence of lymphoma.  Elongated spleen with normal metabolic activity. Spleen measuring 10.7 x 6.4 x 16 cm (volume 570 cm) compared with 11 x 6.3 x 16.7 (volume 610 cm) on 10/22/2020 when remeasured. - CTAP with contrast from 02/14/2021 which showed spleen measuring 17 cm craniocaudal unchanged from prior exam.  No splenic infarct was seen.  Small left ovarian simple appearing cyst - Work-up including ANA and rheumatoid factor was negative.  EBV VCA IgG was elevated at 319, VCA IgM was less than 36.  HIV and JAK2 V617F reflex testing was negative.  Repeat EBV testing (04/12/2021) showed EBV VCA IgG at 393 and undetectable EBV VCA IgM. - Flow cytometry (11/09/2020): Negative for any monoclonal B-cell population or T-cell abnormalities - No lymphadenopathy on exam  - ROS positive for B symptoms (chills, night sweats, unintentional weight loss) as well as early satiety and LUQ abdominal pain (relieved by tramadol) - PLAN: Malignant causes  of splenomegaly have been ruled out.  Referral sent to infectious disease for further work-up of splenomegaly and systemic symptoms. - Patient advised to refrain from lifting heavy weights and engaging in high impact activities. - Repeat labs and RTC in 4 months. - Continue tramadol for management of LUQ pain.  2.  Thrombocytopenia - Most recent CBC (04/12/2021): Platelets 139 - No bleeding, bruising, or petechial rash - Secondary to splenomegaly - PLAN: Continue monitoring with CBC in 4 months.  3.  Low vitamin 123456 and folic acid levels: - She was told to start on vitamin 123456 and folic acid tablets daily. - She has been taking B12, but reports that folic acid makes her nauseous - Most recent labs (04/12/2021) show normal B12 and methylmalonic acid, but low folate at 3.9 we will plan to check at next visit. - PLAN: Continue taking B12.  Instructed to try a different formulation of folic acid or prenatal vitamin.  4.  Weight loss: - She had 25 pound weight loss in the last 6 months. - PET scan did not show any evidence of lymphoma or other malignancies. - Labs today are stable compared to last visit.  5.  Social/family history: - She has not been working since April 2022 when she started feeling sick. - She used to work at  [employer].  She smokes about 3 to 4 cigarettes/day. - Paternal aunt had breast cancer.  Paternal uncle had lung cancer    PLAN SUMMARY & DISPOSITION: -Referral to infectious disease to evaluate for infectious causes of splenomegaly. - Repeat labs and RTC in 4 months  All questions were answered. The patient knows to call the clinic with any problems, questions or concerns.  Medical decision making: Moderate  Time spent on visit: I spent 20 minutes counseling the patient face to face. The total time spent in the appointment was 30 minutes and more than 50% was on counseling.   Carnella Guadalajara, PA-C  04/19/2021 11:26 PM

## 2021-04-25 ENCOUNTER — Emergency Department (HOSPITAL_COMMUNITY)
Admission: EM | Admit: 2021-04-25 | Discharge: 2021-04-26 | Disposition: A | Discharge status: Home / Self Care | Payer: Medicaid Other | Attending: Emergency Medicine | Admitting: Emergency Medicine

## 2021-04-25 ENCOUNTER — Other Ambulatory Visit: Payer: Self-pay

## 2021-04-25 ENCOUNTER — Encounter (HOSPITAL_COMMUNITY): Payer: Self-pay | Admitting: Emergency Medicine

## 2021-04-25 DIAGNOSIS — R110 Nausea: Secondary | ICD-10-CM | POA: Insufficient documentation

## 2021-04-25 DIAGNOSIS — F17210 Nicotine dependence, cigarettes, uncomplicated: Secondary | ICD-10-CM | POA: Diagnosis not present

## 2021-04-25 DIAGNOSIS — R197 Diarrhea, unspecified: Secondary | ICD-10-CM | POA: Insufficient documentation

## 2021-04-25 DIAGNOSIS — Z7951 Long term (current) use of inhaled steroids: Secondary | ICD-10-CM | POA: Diagnosis not present

## 2021-04-25 DIAGNOSIS — J45909 Unspecified asthma, uncomplicated: Secondary | ICD-10-CM | POA: Insufficient documentation

## 2021-04-25 DIAGNOSIS — K922 Gastrointestinal hemorrhage, unspecified: Secondary | ICD-10-CM | POA: Diagnosis present

## 2021-04-25 DIAGNOSIS — R1012 Left upper quadrant pain: Secondary | ICD-10-CM | POA: Insufficient documentation

## 2021-04-25 LAB — COMPREHENSIVE METABOLIC PANEL
ALT: 11 U/L (ref 0–44)
AST: 11 U/L — ABNORMAL LOW (ref 15–41)
Albumin: 4 g/dL (ref 3.5–5.0)
Alkaline Phosphatase: 70 U/L (ref 38–126)
Anion gap: 8 (ref 5–15)
BUN: 9 mg/dL (ref 6–20)
CO2: 25 mmol/L (ref 22–32)
Calcium: 8.4 mg/dL — ABNORMAL LOW (ref 8.9–10.3)
Chloride: 104 mmol/L (ref 98–111)
Creatinine, Ser: 0.58 mg/dL (ref 0.44–1.00)
GFR, Estimated: >60 mL/min (ref >60)
Glucose, Bld: 92 mg/dL (ref 70–99)
Potassium: 3.7 mmol/L (ref 3.5–5.1)
Sodium: 137 mmol/L (ref 135–145)
Total Bilirubin: 0.3 mg/dL (ref 0.3–1.2)
Total Protein: 7.3 g/dL (ref 6.5–8.1)

## 2021-04-25 LAB — TYPE AND SCREEN
ABO/RH(D): O POS
Antibody Screen: NEGATIVE

## 2021-04-25 LAB — POC URINE PREG, ED: Preg Test, Ur: NEGATIVE

## 2021-04-25 LAB — CBC
HCT: 38.7 % (ref 36.0–46.0)
Hemoglobin: 12.8 g/dL (ref 12.0–15.0)
MCH: 29.4 pg (ref 26.0–34.0)
MCHC: 33.1 g/dL (ref 30.0–36.0)
MCV: 89 fL (ref 80.0–100.0)
Platelets: 135 10*3/uL — ABNORMAL LOW (ref 150–400)
RBC: 4.35 MIL/uL (ref 3.87–5.11)
RDW: 14.1 % (ref 11.5–15.5)
WBC: 5.6 10*3/uL (ref 4.0–10.5)
nRBC: 0 % (ref 0.0–0.2)

## 2021-04-25 LAB — POC OCCULT BLOOD, ED: Fecal Occult Bld: NEGATIVE

## 2021-04-25 MED ORDER — ONDANSETRON HCL 4 MG/2ML IJ SOLN
4.0000 mg | Freq: Once | INTRAMUSCULAR | Status: AC
  Administered 2021-04-26: 4 mg via INTRAVENOUS
  Filled 2021-04-25: qty 2

## 2021-04-25 MED ORDER — LOPERAMIDE HCL 2 MG PO CAPS
4.0000 mg | ORAL_CAPSULE | Freq: Once | ORAL | Status: AC
  Administered 2021-04-26: 4 mg via ORAL
  Filled 2021-04-25: qty 2

## 2021-04-25 MED ORDER — LACTATED RINGERS IV BOLUS
1000.0000 mL | Freq: Once | INTRAVENOUS | Status: AC
  Administered 2021-04-26: 1000 mL via INTRAVENOUS

## 2021-04-25 NOTE — ED Notes (Signed)
Pt ambulated to the bathroom and back to room unassisted 

## 2021-04-25 NOTE — ED Provider Notes (Signed)
[facility] EMERGENCY DEPARTMENT Provider Note   CSN: [id number] Arrival date & time: 04/25/21  2227     History Chief Complaint  Patient presents with   GI Bleeding    Brittany Payne is a 30 y.o. female.  The history is provided by the patient.  She has history of bipolar disorder, asthma, splenomegaly and comes in with diarrhea for the last 2 days.  She has been having 5-6 bowel movements a day which initially were yellow.  The last bowel movement was dark red which prompted her to come to the emergency department.  She is feeling a little bit lightheaded.  She denies fever, chills, sweats.  She has had left upper quadrant pain which he rates at 7/10, but she has been having this with her splenomegaly.  She has had nausea but no vomiting.  She denies having eaten anything that was red in color and denies taking Pepto-Bismol, but does admit to having eaten a taco.  She has taken 2 doses of loperamide 2 mg with no improvement in her diarrhea.  She denies any sick contacts who had a GI illness, but did have a sick contact with someone with COVID.  That contact was 2 days ago.   Past Medical History:  Diagnosis Date   Asthma    Bipolar 1 disorder [facility])    Depression     Patient Active Problem List   Diagnosis Date Noted   Umbilical hernia without obstruction and without gangrene 07/26/2019   Positive urine drug screen 12/31/2018   Anemia 02/05/2017   Bipolar disorder ([facility]) 02/05/2017   Depression with anxiety 02/05/2017   Enlargement of spleen 02/05/2017   Migraine 02/05/2017    Past Surgical History:  Procedure Laterality Date   CESAREAN SECTION N/A    CHOLECYSTECTOMY     HERNIA REPAIR     MYRINGOTOMY     TUBAL LIGATION       OB History     Gravida  3   Para  2   Term  2   Preterm      AB      Living  2      SAB      IAB      Ectopic      Multiple      Live Births              Family History  Problem Relation Age of Onset   Hypertension  Mother    Diabetes Father    Hypertension Father    Cancer Father    Lung cancer Father    Diabetes Other     Social History   Tobacco Use   Smoking status: Every Day    Packs/day: 0.50    Years: 8.00    Pack years: 4.00    Types: Cigarettes    Last attempt to quit: 12/21/2015    Years since quitting: 5.3   Smokeless tobacco: Never  Vaping Use   Vaping Use: Never used  Substance Use Topics   Alcohol use: No   Drug use: Not Currently    Types: Marijuana    Home Medications Prior to Admission medications   Medication Sig Start Date End Date Taking? Authorizing Provider  acetaminophen (TYLENOL) 325 MG tablet Take 650 mg by mouth every 6 (six) hours as needed for mild pain.  Patient not taking: Reported on 02/19/2021    [provider]  albuterol (VENTOLIN HFA) 108 (90 Base) MCG/ACT inhaler Inhale  2 puffs into the lungs every 4 (four) hours as needed for wheezing or shortness of breath.    [provider]  atorvastatin (LIPITOR) 10 MG tablet Take 1 tablet (10 mg total) by mouth every other day. 03/19/21 06/17/21  Pricilla Riffle, MD  ipratropium (ATROVENT) 0.03 % nasal spray Place 2 sprays into both nostrils 2 (two) times daily. 07/10/20   Bing Neighbors, FNP  ipratropium-albuterol (DUONEB) 0.5-2.5 (3) MG/3ML SOLN Take 3 mLs by nebulization every 6 (six) hours as needed. Patient not taking: Reported on 04/19/2021 03/08/21   Achille Rich, PA-C  loperamide (IMODIUM) 2 MG capsule Take 1 capsule (2 mg total) by mouth 4 (four) times daily as needed for diarrhea or loose stools. Patient not taking: Reported on 03/19/2021 10/27/20   Burgess Amor, PA-C  metoprolol succinate (TOPROL XL) 25 MG 24 hr tablet Take 0.5 tablets (12.5 mg total) by mouth as directed. 03/19/21   Pricilla Riffle, MD  promethazine (PHENERGAN) 25 MG tablet Take 1 tablet (25 mg total) by mouth every 6 (six) hours as needed for nausea or vomiting. Patient not taking: Reported on 02/19/2021 10/27/20   Burgess Amor, PA-C  traMADol (ULTRAM) 50 MG tablet Take 0.5 tablets (25 mg total) by mouth every 6 (six) hours as needed for severe pain. 04/19/21   Carnella Guadalajara, PA-C  cetirizine (ZYRTEC) 10 MG tablet Take 1 tablet (10 mg total) by mouth daily. Patient not taking: Reported on 03/06/2020 09/07/19 03/16/20  Pauline Aus, PA-C    Allergies    Yellow jacket venom, Amoxicillin, Celexa [citalopram], Penicillins, and Vistaril [hydroxyzine hcl]  Review of Systems   Review of Systems  All other systems reviewed and are negative.  Physical Exam Updated Vital Signs BP 106/69    Pulse 97    Temp 98.3 F (36.8 C)    Resp 18    Ht 5\' 2"  (1.575 m)    Wt 70.8 kg    LMP 04/12/2021    SpO2 93%    BMI 28.53 kg/m   Physical Exam Vitals and nursing note reviewed. Exam conducted with a chaperone present.  30 year old female, resting comfortably and in no acute distress. Vital signs are normal. Oxygen saturation is 93%, which is normal. Head is normocephalic and atraumatic. PERRLA, EOMI. Oropharynx is clear. Neck is nontender and supple without adenopathy or JVD. Back is nontender and there is no CVA tenderness. Lungs are clear without rales, wheezes, or rhonchi. Chest is nontender. Heart has regular rate and rhythm without murmur. Abdomen is soft, flat, with mild to moderate left upper quadrant tenderness.  Splenomegaly is noted.  There is no rebound or guarding.  No other masses are felt.  Peristalsis is diminished. Rectal: Normal sphincter tone, no stool present in the ampulla but mucus that is present is tested and is Hemoccult negative. Extremities have no cyanosis or edema, full range of motion is present. Skin is warm and dry without rash. Neurologic: Mental status is normal, cranial nerves are intact, moves all extremities equally.  ED Results / Procedures / Treatments   Labs (all labs ordered are listed, but only abnormal results are displayed) Labs Reviewed  COMPREHENSIVE METABOLIC PANEL  - Abnormal; Notable for the following components:      Result Value   Calcium 8.4 (*)    AST 11 (*)    All other components within normal limits  CBC - Abnormal; Notable for the following components:   Platelets 135 (*)  All other components within normal limits  POC URINE PREG, ED  POC OCCULT BLOOD, ED  TYPE AND SCREEN   Procedures Procedures   Medications Ordered in ED Medications  lactated ringers bolus 1,000 mL (has no administration in time range)  ondansetron (ZOFRAN) injection 4 mg (has no administration in time range)  loperamide (IMODIUM) capsule 4 mg (has no administration in time range)    ED Course  I have reviewed the triage vital signs and the nursing notes.  Pertinent labs & imaging results that were available during my care of the patient were reviewed by me and considered in my medical decision making (see chart for details).   MDM Rules/Calculators/A&P                         Diarrhea with 1 red stool.  Stool is Hemoccult negative, but there has been a slight drop in hemoglobin compared with recent hemoglobins.  Mild thrombocytopenia is present, not severe enough to increase bleeding risk and unchanged from baseline.  No evidence of significant bleeding.  She will be given IV fluids, ondansetron and appropriate initial dose of loperamide.  Old records reviewed confirming diagnosis of splenomegaly which is currently being evaluated.  Malignant causes have been eliminated and she has been referred to infectious disease.  She continued to have nausea in spite of ondansetron and was given prochlorperazine with significant improvement in nausea.  She feels better after IV hydration.  She is discharged with prescription for prochlorperazine, advised to continue using loperamide as needed.  Return precautions discussed.  Final Clinical Impression(s) / ED Diagnoses Final diagnoses:  Diarrhea of presumed infectious origin  Nausea    Rx / DC Orders ED Discharge  Orders          Ordered    prochlorperazine (COMPAZINE) 10 MG tablet  Every 6 hours PRN        04/26/21 0128             Dione Booze, MD 04/26/21 0129

## 2021-04-25 NOTE — ED Triage Notes (Signed)
Pt c/o dark stools that started today and diarrhea for a few days.

## 2021-04-26 MED ORDER — PROCHLORPERAZINE EDISYLATE 10 MG/2ML IJ SOLN
10.0000 mg | Freq: Once | INTRAMUSCULAR | Status: AC
  Administered 2021-04-26: 10 mg via INTRAVENOUS
  Filled 2021-04-26: qty 2

## 2021-04-26 MED ORDER — PROCHLORPERAZINE MALEATE 10 MG PO TABS: 10.0000 mg | ORAL_TABLET | Freq: Four times a day (QID) | ORAL | 0 refills | Status: DC | PRN

## 2021-04-26 MED ORDER — TRAMADOL HCL 50 MG PO TABS
50.0000 mg | ORAL_TABLET | Freq: Once | ORAL | Status: AC
  Administered 2021-04-26: 50 mg via ORAL
  Filled 2021-04-26: qty 1

## 2021-04-26 NOTE — Discharge Instructions (Signed)
You may continue taking loperamide 4 times a day as needed to help control the diarrhea.

## 2021-05-02 ENCOUNTER — Ambulatory Visit: Payer: Medicaid Other | Admitting: Infectious Diseases

## 2021-05-02 NOTE — Progress Notes (Deleted)
Patient Active Problem List   Diagnosis Date Noted   Umbilical hernia without obstruction and without gangrene 07/26/2019   Positive urine drug screen 12/31/2018   Anemia 02/05/2017   Bipolar disorder (HCC) 02/05/2017   Depression with anxiety 02/05/2017   Enlargement of spleen 02/05/2017   Migraine 02/05/2017    Patient's Medications  New Prescriptions   No medications on file  Previous Medications   ACETAMINOPHEN (TYLENOL) 325 MG TABLET    Take 650 mg by mouth every 6 (six) hours as needed for mild pain.    ALBUTEROL (VENTOLIN HFA) 108 (90 BASE) MCG/ACT INHALER    Inhale 2 puffs into the lungs every 4 (four) hours as needed for wheezing or shortness of breath.   ATORVASTATIN (LIPITOR) 10 MG TABLET    Take 1 tablet (10 mg total) by mouth every other day.   IPRATROPIUM (ATROVENT) 0.03 % NASAL SPRAY    Place 2 sprays into both nostrils 2 (two) times daily.   IPRATROPIUM-ALBUTEROL (DUONEB) 0.5-2.5 (3) MG/3ML SOLN    Take 3 mLs by nebulization every 6 (six) hours as needed.   LOPERAMIDE (IMODIUM) 2 MG CAPSULE    Take 1 capsule (2 mg total) by mouth 4 (four) times daily as needed for diarrhea or loose stools.   METOPROLOL SUCCINATE (TOPROL XL) 25 MG 24 HR TABLET    Take 0.5 tablets (12.5 mg total) by mouth as directed.   PROCHLORPERAZINE (COMPAZINE) 10 MG TABLET    Take 1 tablet (10 mg total) by mouth every 6 (six) hours as needed for nausea or vomiting.   TRAMADOL (ULTRAM) 50 MG TABLET    Take 0.5 tablets (25 mg total) by mouth every 6 (six) hours as needed for severe pain.  Modified Medications   No medications on file  Discontinued Medications   No medications on file    Subjective: 30 Y O female with PMH of Asthma, Bipolar d/o and Depression   Review of Systems: ROS  Past Medical History:  Diagnosis Date   Asthma    Bipolar 1 disorder (HCC)    Depression    Past Surgical History:  Procedure Laterality Date   CESAREAN SECTION N/A    CHOLECYSTECTOMY      HERNIA REPAIR     MYRINGOTOMY     TUBAL LIGATION      Social History   Tobacco Use   Smoking status: Every Day    Packs/day: 0.50    Years: 8.00    Pack years: 4.00    Types: Cigarettes    Last attempt to quit: 12/21/2015    Years since quitting: 5.3   Smokeless tobacco: Never  Vaping Use   Vaping Use: Never used  Substance Use Topics   Alcohol use: No   Drug use: Not Currently    Types: Marijuana    Family History  Problem Relation Age of Onset   Hypertension Mother    Diabetes Father    Hypertension Father    Cancer Father    Lung cancer Father    Diabetes Other     Allergies  Allergen Reactions   Yellow Jacket Venom Anaphylaxis and Swelling   Amoxicillin Nausea And Vomiting   Celexa [Citalopram] Other (See Comments)    Patient states "makes me go crazy".   Penicillins Diarrhea, Itching and Nausea And Vomiting    Has patient had a PCN reaction causing immediate rash, facial/tongue/throat swelling, SOB or lightheadedness with hypotension: Yes Has patient  had a PCN reaction causing severe rash involving mucus membranes or skin necrosis: Yes Has patient had a PCN reaction that required hospitalization: Yes Has patient had a PCN reaction occurring within the last 10 years: No If all of the above answers are "NO", then may proceed with Cephalosporin use.    Vistaril [Hydroxyzine Hcl] Hives and Itching    Health Maintenance  Topic Date Due   COVID-19 Vaccine (1) Never done   Pneumococcal Vaccine 63-23 Years old (1 - PCV) Never done   PAP SMEAR-Modifier  Never done   INFLUENZA VACCINE  06/12/2021 (Originally 12/11/2020)   TETANUS/TDAP  06/02/2029   Hepatitis C Screening  Completed   HIV Screening  Completed   HPV VACCINES  Aged Out    Objective:  There were no vitals filed for this visit. There is no height or weight on file to calculate BMI.  Physical Exam Constitutional:      Appearance: Normal appearance.  HENT:     Head: Normocephalic and atraumatic.       Mouth: Mucous membranes are moist.  Eyes:    Conjunctiva/sclera: Conjunctivae normal.     Pupils: Pupils are equal, round, and reactive to light.   Cardiovascular:     Rate and Rhythm: Normal rate and regular rhythm.     Heart sounds: No murmur heard. No friction rub. No gallop.   Pulmonary:     Effort: Pulmonary effort is normal.     Breath sounds: Normal breath sounds.   Abdominal:     General: Abdomen is flat.     Palpations: Abdomen is soft.   Musculoskeletal:        General: Normal range of motion.   Skin:    General: Skin is warm and dry.     Comments:  Neurological:     General: No focal deficit present.     Mental Status: She is alert and oriented to person, place, and time.   Psychiatric:        Mood and Affect: Mood normal.   Lab Results Lab Results  Component Value Date   WBC 5.6 04/25/2021   HGB 12.8 04/25/2021   HCT 38.7 04/25/2021   MCV 89.0 04/25/2021   PLT 135 (L) 04/25/2021    Lab Results  Component Value Date   CREATININE 0.58 04/25/2021   BUN 9 04/25/2021   NA 137 04/25/2021   K 3.7 04/25/2021   CL 104 04/25/2021   CO2 25 04/25/2021    Lab Results  Component Value Date   ALT 11 04/25/2021   AST 11 (L) 04/25/2021   ALKPHOS 70 04/25/2021   BILITOT 0.3 04/25/2021    No results found for: CHOL, HDL, LDLCALC, LDLDIRECT, TRIG, CHOLHDL No results found for: LABRPR, RPRTITER No results found for: HIV1RNAQUANT, HIV1RNAVL, CD4TABS  Pertinent Imaging      Problem List Items Addressed This Visit   None     Victoriano Lain, MD [facility] 05/02/2021, 1:40 PM

## 2021-05-07 ENCOUNTER — Telehealth: Payer: Medicaid Other

## 2021-05-07 ENCOUNTER — Encounter: Payer: Self-pay | Admitting: Nurse Practitioner

## 2021-06-01 ENCOUNTER — Ambulatory Visit: Payer: Medicaid Other | Admitting: Infectious Diseases

## 2021-06-20 ENCOUNTER — Other Ambulatory Visit: Payer: Self-pay

## 2021-06-20 ENCOUNTER — Ambulatory Visit
Admission: EM | Admit: 2021-06-20 | Discharge: 2021-06-20 | Disposition: A | Discharge status: Home / Self Care | Payer: Medicaid Other | Attending: Family Medicine | Admitting: Family Medicine

## 2021-06-20 ENCOUNTER — Encounter: Payer: Self-pay | Admitting: Emergency Medicine

## 2021-06-20 DIAGNOSIS — J069 Acute upper respiratory infection, unspecified: Secondary | ICD-10-CM

## 2021-06-20 MED ORDER — PROMETHAZINE-DM 6.25-15 MG/5ML PO SYRP: 5.0000 mL | ORAL_SOLUTION | Freq: Four times a day (QID) | ORAL | 0 refills | Status: DC | PRN

## 2021-06-20 NOTE — ED Triage Notes (Signed)
Sore throat, bilateral ear pain, cough x 2 days.

## 2021-06-23 NOTE — ED Provider Notes (Signed)
[facility]   [id number] 06/20/21 Arrival Time: 1244  ASSESSMENT & PLAN:  1. Viral URI with cough     Discussed typical duration of viral illnesses. Viral testing declined. OTC symptom care as needed.  Discharge Medication List as of 06/20/2021  2:25 PM     START taking these medications   Details  promethazine-dextromethorphan (PROMETHAZINE-DM) 6.25-15 MG/5ML syrup Take 5 mLs by mouth 4 (four) times daily as needed for cough., Starting Wed 06/20/2021, Normal         Follow-up Information     Practice, [facility].   Why: As needed. Contact information: [address] [place] [address] [phone]                 Reviewed expectations re: course of current medical issues. Questions answered. Outlined signs and symptoms indicating need for more acute intervention. Understanding verbalized. After Visit Summary given.   SUBJECTIVE: History from: Patient. Brittany Payne is a 31 y.o. female. Reports: ST, ear pressure, cough; x 2 days; children with same. Denies: fever and difficulty breathing. Normal PO intake without n/v/d.  OBJECTIVE:  Vitals:   06/20/21 1337  BP: (!) 135/93  Pulse: 100  Resp: 18  Temp: 99.2 F (37.3 C)  TempSrc: Oral  SpO2: 99%    General appearance: alert; no distress Eyes: PERRLA; EOMI; conjunctiva normal HENT: [place]; AT; with nasal congestion Neck: supple  Lungs: speaks full sentences without difficulty; unlabored; dry cough Extremities: no edema Skin: warm and dry Neurologic: normal gait Psychological: alert and cooperative; normal mood and affect  Labs: Results for orders placed or performed during the hospital encounter of 04/25/21  Comprehensive metabolic panel  Result Value Ref Range   Sodium 137 135 - 145 mmol/L   Potassium 3.7 3.5 - 5.1 mmol/L   Chloride 104 98 - 111 mmol/L   CO2 25 22 - 32 mmol/L   Glucose, Bld 92 70 - 99 mg/dL   BUN 9 6 - 20 mg/dL   Creatinine, Ser 7.51 0.44 - 1.00 mg/dL   Calcium  8.4 (L) 8.9 - 10.3 mg/dL   Total Protein 7.3 6.5 - 8.1 g/dL   Albumin 4.0 3.5 - 5.0 g/dL   AST 11 (L) 15 - 41 U/L   ALT 11 0 - 44 U/L   Alkaline Phosphatase 70 38 - 126 U/L   Total Bilirubin 0.3 0.3 - 1.2 mg/dL   GFR, Estimated >02 >58 mL/min   Anion gap 8 5 - 15  CBC  Result Value Ref Range   WBC 5.6 4.0 - 10.5 K/uL   RBC 4.35 3.87 - 5.11 MIL/uL   Hemoglobin 12.8 12.0 - 15.0 g/dL   HCT 52.7 78.2 - 42.3 %   MCV 89.0 80.0 - 100.0 fL   MCH 29.4 26.0 - 34.0 pg   MCHC 33.1 30.0 - 36.0 g/dL   RDW 53.6 14.4 - 31.5 %   Platelets 135 (L) 150 - 400 K/uL   nRBC 0.0 0.0 - 0.2 %  POC occult blood, ED  Result Value Ref Range   Fecal Occult Bld NEGATIVE NEGATIVE  POC urine preg, ED  Result Value Ref Range   Preg Test, Ur NEGATIVE NEGATIVE  Type and screen [facility]  Result Value Ref Range   ABO/RH(D) O POS    Antibody Screen NEG    Sample Expiration      04/28/2021,2359 Performed at [facility], [address], [place], [place] [address]    Labs Reviewed -  No data to display  Imaging: No results found.  Allergies  Allergen Reactions   Yellow Jacket Venom Anaphylaxis and Swelling   Amoxicillin Nausea And Vomiting   Celexa [Citalopram] Other (See Comments)    Patient states "makes me go crazy".   Penicillins Diarrhea, Itching and Nausea And Vomiting    Has patient had a PCN reaction causing immediate rash, facial/tongue/throat swelling, SOB or lightheadedness with hypotension: Yes Has patient had a PCN reaction causing severe rash involving mucus membranes or skin necrosis: Yes Has patient had a PCN reaction that required hospitalization: Yes Has patient had a PCN reaction occurring within the last 10 years: No If all of the above answers are "NO", then may proceed with Cephalosporin use.    Vistaril [Hydroxyzine Hcl] Hives and Itching    Past Medical History:  Diagnosis Date   Asthma    Bipolar 1 disorder (HCC)    Depression    Social History    Socioeconomic History   Marital status: Single    Spouse name: Not on file   Number of children: 4   Years of education: Not on file   Highest education level: Not on file  Occupational History   Not on file  Tobacco Use   Smoking status: Every Day    Packs/day: 0.50    Years: 8.00    Pack years: 4.00    Types: Cigarettes    Last attempt to quit: 12/21/2015    Years since quitting: 5.5   Smokeless tobacco: Never  Vaping Use   Vaping Use: Never used  Substance and Sexual Activity   Alcohol use: No   Drug use: Not Currently    Types: Marijuana   Sexual activity: Yes    Birth control/protection: None  Other Topics Concern   Not on file  Social History Narrative   Not on file   Social Determinants of Health   Financial Resource Strain: Low Risk    Difficulty of Paying Living Expenses: Not hard at all  Food Insecurity: No Food Insecurity   Worried About Programme researcher, broadcasting/film/video in the Last Year: Never true   Ran Out of Food in the Last Year: Never true  Transportation Needs: No Transportation Needs   Lack of Transportation (Medical): No   Lack of Transportation (Non-Medical): No  Physical Activity: Sufficiently Active   Days of Exercise per Week: 7 days   Minutes of Exercise per Session: 60 min  Stress: No Stress Concern Present   Feeling of Stress : Not at all  Social Connections: Moderately Integrated   Frequency of Communication with Friends and Family: More than three times a week   Frequency of Social Gatherings with Friends and Family: Once a week   Attends Religious Services: More than 4 times per year   Active Member of [employer] or Organizations: No   Attends Banker Meetings: Never   Marital Status: Living with partner  Intimate Partner Violence: Not on file   Family History  Problem Relation Age of Onset   Hypertension Mother    Diabetes Father    Hypertension Father    Cancer Father    Lung cancer Father    Diabetes Other    Past Surgical  History:  Procedure Laterality Date   CESAREAN SECTION N/A    CHOLECYSTECTOMY     HERNIA REPAIR     MYRINGOTOMY     TUBAL LIGATION       Mardella Layman, MD 06/23/21 1025

## 2021-06-27 ENCOUNTER — Ambulatory Visit: Payer: Medicaid Other | Admitting: Infectious Diseases

## 2021-06-27 NOTE — Progress Notes (Deleted)
Patient Active Problem List   Diagnosis Date Noted   Umbilical hernia without obstruction and without gangrene 07/26/2019   Positive urine drug screen 12/31/2018   Anemia 02/05/2017   Bipolar disorder (HCC) 02/05/2017   Depression with anxiety 02/05/2017   Enlargement of spleen 02/05/2017   Migraine 02/05/2017   Past Surgical History:  Procedure Laterality Date   CESAREAN SECTION N/A    CHOLECYSTECTOMY     HERNIA REPAIR     MYRINGOTOMY     TUBAL LIGATION       Patient's Medications  New Prescriptions   No medications on file  Previous Medications   ACETAMINOPHEN (TYLENOL) 325 MG TABLET    Take 650 mg by mouth every 6 (six) hours as needed for mild pain.    ALBUTEROL (VENTOLIN HFA) 108 (90 BASE) MCG/ACT INHALER    Inhale 2 puffs into the lungs every 4 (four) hours as needed for wheezing or shortness of breath.   ATORVASTATIN (LIPITOR) 10 MG TABLET    Take 1 tablet (10 mg total) by mouth every other day.   IPRATROPIUM (ATROVENT) 0.03 % NASAL SPRAY    Place 2 sprays into both nostrils 2 (two) times daily.   IPRATROPIUM-ALBUTEROL (DUONEB) 0.5-2.5 (3) MG/3ML SOLN    Take 3 mLs by nebulization every 6 (six) hours as needed.   LOPERAMIDE (IMODIUM) 2 MG CAPSULE    Take 1 capsule (2 mg total) by mouth 4 (four) times daily as needed for diarrhea or loose stools.   METOPROLOL SUCCINATE (TOPROL XL) 25 MG 24 HR TABLET    Take 0.5 tablets (12.5 mg total) by mouth as directed.   PROCHLORPERAZINE (COMPAZINE) 10 MG TABLET    Take 1 tablet (10 mg total) by mouth every 6 (six) hours as needed for nausea or vomiting.   PROMETHAZINE-DEXTROMETHORPHAN (PROMETHAZINE-DM) 6.25-15 MG/5ML SYRUP    Take 5 mLs by mouth 4 (four) times daily as needed for cough.   TRAMADOL (ULTRAM) 50 MG TABLET    Take 0.5 tablets (25 mg total) by mouth every 6 (six) hours as needed for severe pain.  Modified Medications   No medications on file  Discontinued Medications   No medications on file     Subjective: 31 Y O female with PMH of anxiety, depression,splenomegaly, migrane, anemia   Review of Systems: ROS  Past Medical History:  Diagnosis Date   Asthma    Bipolar 1 disorder (HCC)    Depression     Social History   Tobacco Use   Smoking status: Every Day    Packs/day: 0.50    Years: 8.00    Pack years: 4.00    Types: Cigarettes    Last attempt to quit: 12/21/2015    Years since quitting: 5.5   Smokeless tobacco: Never  Vaping Use   Vaping Use: Never used  Substance Use Topics   Alcohol use: No   Drug use: Not Currently    Types: Marijuana    Family History  Problem Relation Age of Onset   Hypertension Mother    Diabetes Father    Hypertension Father    Cancer Father    Lung cancer Father    Diabetes Other     Allergies  Allergen Reactions   Yellow Jacket Venom Anaphylaxis and Swelling   Amoxicillin Nausea And Vomiting   Celexa [Citalopram] Other (See Comments)    Patient states "makes me go crazy".   Penicillins Diarrhea, Itching and Nausea And  Vomiting    Has patient had a PCN reaction causing immediate rash, facial/tongue/throat swelling, SOB or lightheadedness with hypotension: Yes Has patient had a PCN reaction causing severe rash involving mucus membranes or skin necrosis: Yes Has patient had a PCN reaction that required hospitalization: Yes Has patient had a PCN reaction occurring within the last 10 years: No If all of the above answers are "NO", then may proceed with Cephalosporin use.    Vistaril [Hydroxyzine Hcl] Hives and Itching    Health Maintenance  Topic Date Due   COVID-19 Vaccine (1) Never done   PAP SMEAR-Modifier  Never done   INFLUENZA VACCINE  12/11/2020   TETANUS/TDAP  06/02/2029   Hepatitis C Screening  Completed   HIV Screening  Completed   HPV VACCINES  Aged Out    Objective:  There were no vitals filed for this visit. There is no height or weight on file to calculate BMI.  Physical Exam Constitutional:       Appearance: Normal appearance.  HENT:     Head: Normocephalic and atraumatic.      Mouth: Mucous membranes are moist.  Eyes:    Conjunctiva/sclera: Conjunctivae normal.     Pupils: Pupils are equal, round, and reactive to light.   Cardiovascular:     Rate and Rhythm: Normal rate and regular rhythm.     Heart sounds: No murmur heard. No friction rub. No gallop.   Pulmonary:     Effort: Pulmonary effort is normal.     Breath sounds: Normal breath sounds.   Abdominal:     General: Non distended     Palpations: soft.   Musculoskeletal:        General: Normal range of motion.   Skin:    General: Skin is warm and dry.     Comments:  Neurological:     General: grossly non focal     Mental Status: awake, alert and oriented to person, place, and time.   Psychiatric:        Mood and Affect: Mood normal.   Lab Results Lab Results  Component Value Date   WBC 5.6 04/25/2021   HGB 12.8 04/25/2021   HCT 38.7 04/25/2021   MCV 89.0 04/25/2021   PLT 135 (L) 04/25/2021    Lab Results  Component Value Date   CREATININE 0.58 04/25/2021   BUN 9 04/25/2021   NA 137 04/25/2021   K 3.7 04/25/2021   CL 104 04/25/2021   CO2 25 04/25/2021    Lab Results  Component Value Date   ALT 11 04/25/2021   AST 11 (L) 04/25/2021   ALKPHOS 70 04/25/2021   BILITOT 0.3 04/25/2021    No results found for: CHOL, HDL, LDLCALC, LDLDIRECT, TRIG, CHOLHDL No results found for: LABRPR, RPRTITER No results found for: HIV1RNAQUANT, HIV1RNAVL, CD4TABS   Problem List Items Addressed This Visit   None   I have personally spent more than 70 minutes involved in face-to-face and non-face-to-face activities for this patient on the day of the visit. Professional time spent includes the following activities: Preparing to see the patient (review of tests), Obtaining and/or reviewing separately obtained history (admission/discharge record), Performing a medically appropriate examination and/or evaluation ,  Ordering medications/tests/procedures, referring and communicating with other health care professionals, Documenting clinical information in the EMR, Independently interpreting results (not separately reported), Communicating results to the patient/family/caregiver, Counseling and educating the patient/family/caregiver and Care coordination (not separately reported).   Victoriano Lain, MD [facility]  [facility] 06/27/2021, 11:15 AM

## 2021-06-29 ENCOUNTER — Ambulatory Visit: Payer: Medicaid Other | Admitting: Internal Medicine

## 2021-06-29 ENCOUNTER — Encounter: Payer: Self-pay | Admitting: Internal Medicine

## 2021-08-23 ENCOUNTER — Other Ambulatory Visit: Payer: Self-pay

## 2021-08-23 ENCOUNTER — Other Ambulatory Visit: Payer: Self-pay | Admitting: Obstetrics and Gynecology

## 2021-08-23 ENCOUNTER — Inpatient Hospital Stay (HOSPITAL_COMMUNITY): Payer: Medicaid Other | Attending: Hematology

## 2021-08-23 ENCOUNTER — Encounter (HOSPITAL_COMMUNITY): Payer: Self-pay

## 2021-08-23 ENCOUNTER — Emergency Department (HOSPITAL_COMMUNITY)
Admission: EM | Admit: 2021-08-23 | Discharge: 2021-08-23 | Disposition: A | Discharge status: Home / Self Care | Payer: Medicaid Other | Attending: Emergency Medicine | Admitting: Emergency Medicine

## 2021-08-23 DIAGNOSIS — N6323 Unspecified lump in the left breast, lower outer quadrant: Secondary | ICD-10-CM | POA: Insufficient documentation

## 2021-08-23 DIAGNOSIS — Z79899 Other long term (current) drug therapy: Secondary | ICD-10-CM | POA: Insufficient documentation

## 2021-08-23 DIAGNOSIS — N6313 Unspecified lump in the right breast, lower outer quadrant: Secondary | ICD-10-CM

## 2021-08-23 DIAGNOSIS — Z853 Personal history of malignant neoplasm of breast: Secondary | ICD-10-CM | POA: Diagnosis not present

## 2021-08-23 DIAGNOSIS — R509 Fever, unspecified: Secondary | ICD-10-CM | POA: Diagnosis present

## 2021-08-23 NOTE — ED Triage Notes (Signed)
Patient with complaints of right breast mass and pain for 2 months.  ?

## 2021-08-23 NOTE — ED Provider Notes (Signed)
?[facility] EMERGENCY DEPARTMENT ?Provider Note ? ? ?CSN: [id number] ?Arrival date & time: 08/23/21  1139 ? ?  ? ?History ? ?Chief Complaint  ?Patient presents with  ? Breast Mass  ? ? ?Brittany Payne is a 31 y.o. female. ? ?HPI ? ?Patient without significant medical history presents with complaints of a bump on her right breast.  Patient she had this bump about a month ago and it has gotten a little bit larger and more painful.   states the bumps underneath her right breast  denies any drainage or discharge the area no overlying skin changes,  denies any nipple inversion, no drainage from the nipples themselves, she has family history of breast cancer but none personally, she has had intermittent fevers but denies any body aches generalized weight loss.  She has not seen an OB/GYN for this. ? ? ? ? ? ?Home Medications ?Prior to Admission medications   ?Medication Sig Start Date End Date Taking? Authorizing Provider  ?albuterol (VENTOLIN HFA) 108 (90 Base) MCG/ACT inhaler Inhale 2 puffs into the lungs every 4 (four) hours as needed for wheezing or shortness of breath.   Yes [provider]  ?metoprolol succinate (TOPROL XL) 25 MG 24 hr tablet Take 0.5 tablets (12.5 mg total) by mouth as directed. 03/19/21  Yes Pricilla Riffleoss, Paula V, MD  ?montelukast (SINGULAIR) 10 MG tablet Take 10 mg by mouth at bedtime.   Yes [provider]  ?atorvastatin (LIPITOR) 10 MG tablet Take 1 tablet (10 mg total) by mouth every other day. ?Patient not taking: Reported on 08/23/2021 03/19/21 06/17/21  Pricilla Riffleoss, Paula V, MD  ?ipratropium (ATROVENT) 0.03 % nasal spray Place 2 sprays into both nostrils 2 (two) times daily. ?Patient not taking: Reported on 08/23/2021 07/10/20   Bing NeighborsHarris, Kimberly S, FNP  ?ipratropium-albuterol (DUONEB) 0.5-2.5 (3) MG/3ML SOLN Take 3 mLs by nebulization every 6 (six) hours as needed. ?Patient not taking: Reported on 04/19/2021 03/08/21   Achille Richansom, Riley, PA-C  ?loperamide (IMODIUM) 2 MG capsule Take 1 capsule (2 mg  total) by mouth 4 (four) times daily as needed for diarrhea or loose stools. ?Patient not taking: Reported on 03/19/2021 10/27/20   Burgess AmorIdol, Julie, PA-C  ?prochlorperazine (COMPAZINE) 10 MG tablet Take 1 tablet (10 mg total) by mouth every 6 (six) hours as needed for nausea or vomiting. ?Patient not taking: Reported on 08/23/2021 04/26/21   Dione BoozeGlick, David, MD  ?promethazine-dextromethorphan (PROMETHAZINE-DM) 6.25-15 MG/5ML syrup Take 5 mLs by mouth 4 (four) times daily as needed for cough. ?Patient not taking: Reported on 08/23/2021 06/20/21   Mardella LaymanHagler, Brian, MD  ?traMADol (ULTRAM) 50 MG tablet Take 0.5 tablets (25 mg total) by mouth every 6 (six) hours as needed for severe pain. ?Patient not taking: Reported on 08/23/2021 04/19/21   Carnella GuadalajaraPennington, Rebekah M, PA-C  ?cetirizine (ZYRTEC) 10 MG tablet Take 1 tablet (10 mg total) by mouth daily. ?Patient not taking: Reported on 03/06/2020 09/07/19 03/16/20  Pauline Ausriplett, Tammy, PA-C  ?   ? ?Allergies    ?Yellow jacket venom, Amoxicillin, Celexa [citalopram], Penicillins, and Vistaril [hydroxyzine hcl]   ? ?Review of Systems   ?Review of Systems  ?Constitutional:  Negative for chills and fever.  ?Respiratory:  Negative for shortness of breath.   ?Cardiovascular:  Negative for chest pain.  ?Gastrointestinal:  Negative for abdominal pain.  ?Skin:  Positive for wound.  ?Neurological:  Negative for headaches.  ? ?Physical Exam ?Updated Vital Signs ?BP 127/79 (BP Location: Right Arm)   Pulse 84   Temp  97.9 ?F (36.6 ?C) (Oral)   Resp 19   Ht 5\' 2"  (1.575 m)   Wt 70.8 kg   LMP 07/29/2021 (Approximate)   SpO2 98%   BMI 28.53 kg/m?  ?Physical Exam ?Vitals and nursing note reviewed. Exam conducted with a chaperone present.  ?Constitutional:   ?   General: She is not in acute distress. ?   Appearance: She is not ill-appearing.  ?HENT:  ?   Head: Normocephalic and atraumatic.  ?   Nose: No congestion.  ?Eyes:  ?   Conjunctiva/sclera: Conjunctivae normal.  ?Cardiovascular:  ?   Rate and Rhythm:  Normal rate and regular rhythm.  ?   Pulses: Normal pulses.  ?   Heart sounds: No murmur heard. ?  No friction rub. No gallop.  ?Pulmonary:  ?   Effort: Pulmonary effort is normal.  ?   Breath sounds: Normal breath sounds.  ?Skin: ?   General: Skin is warm and dry.  ?   Comments: With chaperone present breast exam was performed, she has a noted small pustule underneath the right breast, about the size of a pinhead, tender to palpation, no fluctuant areas noted, no overlying skin changes.  No nipple inversion no skin changes around the nipple itself.  No palpable mass during breast exam.  ?Neurological:  ?   Mental Status: She is alert.  ?Psychiatric:     ?   Mood and Affect: Mood normal.  ? ? ?ED Results / Procedures / Treatments   ?Labs ?(all labs ordered are listed, but only abnormal results are displayed) ?Labs Reviewed - No data to display ? ?EKG ?None ? ?Radiology ?No results found. ? ?Procedures ?Procedures  ? ? ?Medications Ordered in ED ?Medications - No data to display ? ?ED Course/ Medical Decision Making/ A&P ?  ?                        ?Medical Decision Making ? ?This patient presents to the ED for concern of breast lump, this involves an extensive number of treatment options, and is a complaint that carries with it a high risk of complications and morbidity.  The differential diagnosis includes abscess, cellulitis, malignancy ? ? ? ?Additional history obtained: ? ?Additional history obtained from N/A ?External records from outside source obtained and reviewed including previous imaging ? ? ?Co morbidities that complicate the patient evaluation ? ?Family history of breast cancer ? ?Social Determinants of Health: ? ?N/A ? ? ? ?Lab Tests: ? ?I Ordered, and personally interpreted labs.  The pertinent results include: N/A ? ? ?Imaging Studies ordered: ? ?I ordered imaging studies including N/A ?I independently visualized and interpreted imaging which showed N/A ?I agree with the radiologist  interpretation ? ? ?Cardiac Monitoring: ? ?The patient was maintained on a cardiac monitor.  I personally viewed and interpreted the cardiac monitored which showed an underlying rhythm of: ? ? ? ?Medicines ordered and prescription drug management: ? ?I ordered medication including N/A ?I have reviewed the patients home medicines and have made adjustments as needed ? ?Critical Interventions: ? ?N/A ? ? ?Reevaluation: ? ?N/A ? ?Consultations Obtained: ? ?N/A ? ? ? ? ?Test Considered: ? ?Ultrasound is our system for abscess is very low at this time ? ? ? ?Rule out ?I have low suspicion for abscess of the breast as there is no overlying skin changes, there is no fluctuant induration present.  I have low suspicion for breast cancer as the tender  area is a papule, which is nonfixed a known spiculated, more consistent likely a cyst.  Low suspicion for cellulitis is no overlying skin changes. ? ? ? ?Dispostion and problem list ? ?After consideration of the diagnostic results and the patients response to treatment, I feel that the patent would benefit from discharge.  ? ?Breast pain-unclear etiology but I suspect likely a cyst, due to the location will defer on I&D at this time, we will have her follow-up with OB for further evaluation.  Strict return precautions. ? ? ? ? ? ? ? ? ? ? ? ?Final Clinical Impression(s) / ED Diagnoses ?Final diagnoses:  ?Mass of lower outer quadrant of right breast  ? ? ?Rx / DC Orders ?ED Discharge Orders   ? ? None  ? ?  ? ? ?  ?Carroll Sage, PA-C ?08/23/21 1348 ? ?  ?Pricilla Loveless, MD ?08/25/21 [phone] ? ?

## 2021-08-23 NOTE — Discharge Instructions (Signed)
Likely this is a cyst, you may use over-the-counter pain medication as needed. ? ?Please follow-up with [facility] and/or the imaging center for further evaluation. ? ?Come back to the emergency department if you develop chest pain, shortness of breath, severe abdominal pain, uncontrolled nausea, vomiting, diarrhea. ? ?

## 2021-08-28 ENCOUNTER — Other Ambulatory Visit (HOSPITAL_COMMUNITY): Payer: Medicaid Other

## 2021-08-29 ENCOUNTER — Ambulatory Visit (HOSPITAL_COMMUNITY): Payer: Medicaid Other | Admitting: Physician Assistant

## 2021-08-29 ENCOUNTER — Other Ambulatory Visit (HOSPITAL_COMMUNITY): Payer: Medicaid Other

## 2021-08-30 ENCOUNTER — Ambulatory Visit (HOSPITAL_COMMUNITY): Payer: Medicaid Other | Admitting: Physician Assistant

## 2021-09-03 ENCOUNTER — Ambulatory Visit
Admission: RE | Admit: 2021-09-03 | Discharge: 2021-09-03 | Disposition: A | Discharge status: Home / Self Care | Payer: Medicaid Other | Source: Ambulatory Visit | Attending: Obstetrics and Gynecology | Admitting: Obstetrics and Gynecology

## 2021-09-03 DIAGNOSIS — N6313 Unspecified lump in the right breast, lower outer quadrant: Secondary | ICD-10-CM

## 2021-09-05 ENCOUNTER — Ambulatory Visit (HOSPITAL_COMMUNITY): Payer: Medicaid Other | Admitting: Physician Assistant

## 2022-01-08 ENCOUNTER — Other Ambulatory Visit: Payer: Self-pay

## 2022-01-08 ENCOUNTER — Emergency Department (HOSPITAL_COMMUNITY)
Admission: EM | Admit: 2022-01-08 | Discharge: 2022-01-09 | Disposition: A | Discharge status: Home / Self Care | Payer: Medicaid Other | Attending: Emergency Medicine | Admitting: Emergency Medicine

## 2022-01-08 ENCOUNTER — Emergency Department (HOSPITAL_COMMUNITY): Payer: Medicaid Other

## 2022-01-08 ENCOUNTER — Encounter (HOSPITAL_COMMUNITY): Payer: Self-pay | Admitting: Emergency Medicine

## 2022-01-08 DIAGNOSIS — R112 Nausea with vomiting, unspecified: Secondary | ICD-10-CM | POA: Diagnosis present

## 2022-01-08 DIAGNOSIS — Z79899 Other long term (current) drug therapy: Secondary | ICD-10-CM | POA: Diagnosis not present

## 2022-01-08 DIAGNOSIS — R350 Frequency of micturition: Secondary | ICD-10-CM

## 2022-01-08 DIAGNOSIS — M545 Low back pain, unspecified: Secondary | ICD-10-CM

## 2022-01-08 LAB — CBC WITH DIFFERENTIAL/PLATELET
Abs Immature Granulocytes: 0.02 10*3/uL (ref 0.00–0.07)
Basophils Absolute: 0 10*3/uL (ref 0.0–0.1)
Basophils Relative: 0 %
Eosinophils Absolute: 0.1 10*3/uL (ref 0.0–0.5)
Eosinophils Relative: 2 %
HCT: 36.2 % (ref 36.0–46.0)
Hemoglobin: 12.3 g/dL (ref 12.0–15.0)
Immature Granulocytes: 0 %
Lymphocytes Relative: 18 %
Lymphs Abs: 1.2 10*3/uL (ref 0.7–4.0)
MCH: 30.2 pg (ref 26.0–34.0)
MCHC: 34 g/dL (ref 30.0–36.0)
MCV: 88.9 fL (ref 80.0–100.0)
Monocytes Absolute: 0.4 10*3/uL (ref 0.1–1.0)
Monocytes Relative: 6 %
Neutro Abs: 4.9 10*3/uL (ref 1.7–7.7)
Neutrophils Relative %: 74 %
Platelets: 128 10*3/uL — ABNORMAL LOW (ref 150–400)
RBC: 4.07 MIL/uL (ref 3.87–5.11)
RDW: 14.9 % (ref 11.5–15.5)
WBC: 6.6 10*3/uL (ref 4.0–10.5)
nRBC: 0 % (ref 0.0–0.2)

## 2022-01-08 LAB — COMPREHENSIVE METABOLIC PANEL
ALT: 26 U/L (ref 0–44)
AST: 32 U/L (ref 15–41)
Albumin: 4.1 g/dL (ref 3.5–5.0)
Alkaline Phosphatase: 83 U/L (ref 38–126)
Anion gap: 8 (ref 5–15)
BUN: 5 mg/dL — ABNORMAL LOW (ref 6–20)
CO2: 26 mmol/L (ref 22–32)
Calcium: 8.6 mg/dL — ABNORMAL LOW (ref 8.9–10.3)
Chloride: 105 mmol/L (ref 98–111)
Creatinine, Ser: 0.49 mg/dL (ref 0.44–1.00)
GFR, Estimated: >60 mL/min (ref >60)
Glucose, Bld: 98 mg/dL (ref 70–99)
Potassium: 3.3 mmol/L — ABNORMAL LOW (ref 3.5–5.1)
Sodium: 139 mmol/L (ref 135–145)
Total Bilirubin: 0.7 mg/dL (ref 0.3–1.2)
Total Protein: 7.5 g/dL (ref 6.5–8.1)

## 2022-01-08 LAB — PREGNANCY, URINE: Preg Test, Ur: NEGATIVE

## 2022-01-08 LAB — URINALYSIS, ROUTINE W REFLEX MICROSCOPIC
Bilirubin Urine: NEGATIVE
Glucose, UA: NEGATIVE mg/dL
Hgb urine dipstick: NEGATIVE
Ketones, ur: NEGATIVE mg/dL
Leukocytes,Ua: NEGATIVE
Nitrite: NEGATIVE
Protein, ur: NEGATIVE mg/dL
Specific Gravity, Urine: 1.01 (ref 1.005–1.030)
pH: 7 (ref 5.0–8.0)

## 2022-01-08 MED ORDER — ONDANSETRON HCL 4 MG/2ML IJ SOLN
4.0000 mg | Freq: Once | INTRAMUSCULAR | Status: AC
Start: 2022-01-08 — End: 2022-01-08
  Administered 2022-01-08: 4 mg via INTRAVENOUS
  Filled 2022-01-08: qty 2

## 2022-01-08 MED ORDER — ONDANSETRON 4 MG PO TBDP: ORAL_TABLET | ORAL | 0 refills | Status: DC

## 2022-01-08 MED ORDER — KETOROLAC TROMETHAMINE 30 MG/ML IJ SOLN
30.0000 mg | Freq: Once | INTRAMUSCULAR | Status: AC
Start: 2022-01-08 — End: 2022-01-08
  Administered 2022-01-08: 30 mg via INTRAVENOUS
  Filled 2022-01-08: qty 1

## 2022-01-08 MED ORDER — SODIUM CHLORIDE 0.9 % IV BOLUS
1000.0000 mL | Freq: Once | INTRAVENOUS | Status: AC
  Administered 2022-01-08: 1000 mL via INTRAVENOUS

## 2022-01-08 MED ORDER — IBUPROFEN 800 MG PO TABS: 800.0000 mg | ORAL_TABLET | Freq: Three times a day (TID) | ORAL | 0 refills | Status: DC | PRN

## 2022-01-08 NOTE — ED Provider Notes (Signed)
11:44 PM Assumed care from Dr. Estell Harpin, please see their note for full history, physical and decision making until this point. In brief this is a 31 y.o. year old female who presented to the ED tonight with Emesis     Here with emesis and flank pain. Concernf or possible stone. Ppending CT and reeval for disposition.   Discharge instructions, including strict return precautions for new or worsening symptoms, given. Patient and/or family verbalized understanding and agreement with the plan as described.   Labs, studies and imaging reviewed by myself and considered in medical decision making if ordered. Imaging interpreted by radiology.  Labs Reviewed  CBC WITH DIFFERENTIAL/PLATELET - Abnormal; Notable for the following components:      Result Value   Platelets 128 (*)    All other components within normal limits  COMPREHENSIVE METABOLIC PANEL - Abnormal; Notable for the following components:   Potassium 3.3 (*)    BUN 5 (*)    Calcium 8.6 (*)    All other components within normal limits  URINALYSIS, ROUTINE W REFLEX MICROSCOPIC  PREGNANCY, URINE    CT Renal Stone Study    (Results Pending)    No follow-ups on file.

## 2022-01-08 NOTE — Discharge Instructions (Addendum)
Follow-up with your family doctor in the next week if not improving °

## 2022-01-08 NOTE — ED Triage Notes (Signed)
Pt c/o n/v x 2 days and left flank and lower back pain since yesterday. Pt states she is having urinary frequency. Pt was covid positive one week ago.

## 2022-01-08 NOTE — ED Provider Notes (Signed)
[facility] EMERGENCY DEPARTMENT Provider Note   CSN: [id number] Arrival date & time: 01/08/22  2123     History {Add pertinent medical, surgical, social history, OB history to HPI:1} Chief Complaint  Patient presents with   Emesis    Brittany Payne is a 31 y.o. female.  Patient complains of left flank pain with frequency and nausea.  Patient has a history of bipolar  The history is provided by the patient. No language interpreter was used.  Emesis Severity:  Moderate Timing:  Intermittent Quality:  Bilious material Able to tolerate:  Liquids Progression:  Unchanged Chronicity:  New Recent urination:  Normal Relieved by:  Nothing Worsened by:  Nothing Ineffective treatments:  None tried Associated symptoms: abdominal pain   Associated symptoms: no cough, no diarrhea and no headaches        Home Medications Prior to Admission medications   Medication Sig Start Date End Date Taking? Authorizing Provider  albuterol (VENTOLIN HFA) 108 (90 Base) MCG/ACT inhaler Inhale 2 puffs into the lungs every 4 (four) hours as needed for wheezing or shortness of breath.    [provider]  atorvastatin (LIPITOR) 10 MG tablet Take 1 tablet (10 mg total) by mouth every other day. Patient not taking: Reported on 08/23/2021 03/19/21 06/17/21  Pricilla Riffle, MD  ipratropium (ATROVENT) 0.03 % nasal spray Place 2 sprays into both nostrils 2 (two) times daily. Patient not taking: Reported on 08/23/2021 07/10/20   Bing Neighbors, FNP  ipratropium-albuterol (DUONEB) 0.5-2.5 (3) MG/3ML SOLN Take 3 mLs by nebulization every 6 (six) hours as needed. Patient not taking: Reported on 04/19/2021 03/08/21   Achille Rich, PA-C  loperamide (IMODIUM) 2 MG capsule Take 1 capsule (2 mg total) by mouth 4 (four) times daily as needed for diarrhea or loose stools. Patient not taking: Reported on 03/19/2021 10/27/20   Burgess Amor, PA-C  metoprolol succinate (TOPROL XL) 25 MG 24 hr tablet Take 0.5 tablets  (12.5 mg total) by mouth as directed. 03/19/21   Pricilla Riffle, MD  montelukast (SINGULAIR) 10 MG tablet Take 10 mg by mouth at bedtime.    [provider]  prochlorperazine (COMPAZINE) 10 MG tablet Take 1 tablet (10 mg total) by mouth every 6 (six) hours as needed for nausea or vomiting. Patient not taking: Reported on 08/23/2021 04/26/21   Dione Booze, MD  promethazine-dextromethorphan (PROMETHAZINE-DM) 6.25-15 MG/5ML syrup Take 5 mLs by mouth 4 (four) times daily as needed for cough. Patient not taking: Reported on 08/23/2021 06/20/21   Mardella Layman, MD  traMADol (ULTRAM) 50 MG tablet Take 0.5 tablets (25 mg total) by mouth every 6 (six) hours as needed for severe pain. Patient not taking: Reported on 08/23/2021 04/19/21   Carnella Guadalajara, PA-C  cetirizine (ZYRTEC) 10 MG tablet Take 1 tablet (10 mg total) by mouth daily. Patient not taking: Reported on 03/06/2020 09/07/19 03/16/20  Pauline Aus, PA-C      Allergies    Yellow jacket venom, Amoxicillin, Celexa [citalopram], Penicillins, and Vistaril [hydroxyzine hcl]    Review of Systems   Review of Systems  Constitutional:  Negative for appetite change and fatigue.  HENT:  Negative for congestion, ear discharge and sinus pressure.   Eyes:  Negative for discharge.  Respiratory:  Negative for cough.   Cardiovascular:  Negative for chest pain.  Gastrointestinal:  Positive for abdominal pain and vomiting. Negative for diarrhea.  Genitourinary:  Negative for frequency and hematuria.       Left flank pain  Musculoskeletal:  Negative for back pain.  Skin:  Negative for rash.  Neurological:  Negative for seizures and headaches.  Psychiatric/Behavioral:  Negative for hallucinations.     Physical Exam Updated Vital Signs BP (!) 144/77   Pulse 69   Temp 98.3 F (36.8 C)   Resp (!) 37   Ht 5\' 1"  (1.549 m)   Wt 67.1 kg   LMP 12/29/2021   SpO2 99%   BMI 27.96 kg/m  Physical Exam Vitals and nursing note reviewed.   Constitutional:      Appearance: She is well-developed.  HENT:     Head: Normocephalic.     Nose: Nose normal.  Eyes:     General: No scleral icterus.    Conjunctiva/sclera: Conjunctivae normal.  Neck:     Thyroid: No thyromegaly.  Cardiovascular:     Rate and Rhythm: Normal rate and regular rhythm.     Heart sounds: No murmur heard.    No friction rub. No gallop.  Pulmonary:     Breath sounds: No stridor. No wheezing or rales.  Chest:     Chest wall: No tenderness.  Abdominal:     General: There is no distension.     Tenderness: There is no abdominal tenderness. There is no rebound.  Genitourinary:    Comments: Tender left flank Musculoskeletal:        General: Normal range of motion.     Cervical back: Neck supple.  Lymphadenopathy:     Cervical: No cervical adenopathy.  Skin:    Findings: No erythema or rash.  Neurological:     Mental Status: She is alert and oriented to person, place, and time.     Motor: No abnormal muscle tone.     Coordination: Coordination normal.  Psychiatric:        Behavior: Behavior normal.     ED Results / Procedures / Treatments   Labs (all labs ordered are listed, but only abnormal results are displayed) Labs Reviewed  CBC WITH DIFFERENTIAL/PLATELET - Abnormal; Notable for the following components:      Result Value   Platelets 128 (*)    All other components within normal limits  COMPREHENSIVE METABOLIC PANEL - Abnormal; Notable for the following components:   Potassium 3.3 (*)    BUN 5 (*)    Calcium 8.6 (*)    All other components within normal limits  URINALYSIS, ROUTINE W REFLEX MICROSCOPIC  PREGNANCY, URINE    EKG None  Radiology No results found.  Procedures Procedures  {Document cardiac monitor, telemetry assessment procedure when appropriate:1}  Medications Ordered in ED Medications  sodium chloride 0.9 % bolus 1,000 mL (1,000 mLs Intravenous New Bag/Given 01/08/22 2204)  ondansetron (ZOFRAN) injection 4 mg  (4 mg Intravenous Given 01/08/22 2204)  ketorolac (TORADOL) 30 MG/ML injection 30 mg (30 mg Intravenous Given 01/08/22 2204)    ED Course/ Medical Decision Making/ A&P                           Medical Decision Making Amount and/or Complexity of Data Reviewed Labs: ordered. Radiology: ordered.  Risk Prescription drug management.   ***  {Document critical care time when appropriate:1} {Document review of labs and clinical decision tools ie heart score, Chads2Vasc2 etc:1}  {Document your independent review of radiology images, and any outside records:1} {Document your discussion with family members, caretakers, and with consultants:1} {Document social determinants of health affecting pt's care:1} {Document your decision making why  or why not admission, treatments were needed:1} Final Clinical Impression(s) / ED Diagnoses Final diagnoses:  None    Rx / DC Orders ED Discharge Orders     None

## 2022-01-09 MED ORDER — ONDANSETRON 4 MG PO TBDP: 4.0000 mg | ORAL_TABLET | Freq: Three times a day (TID) | ORAL | 0 refills | Status: DC | PRN

## 2022-01-09 MED ORDER — CYCLOBENZAPRINE HCL 10 MG PO TABS: 10.0000 mg | ORAL_TABLET | Freq: Two times a day (BID) | ORAL | 0 refills | Status: DC | PRN

## 2022-01-09 MED ORDER — METHOCARBAMOL 500 MG PO TABS
500.0000 mg | ORAL_TABLET | Freq: Once | ORAL | Status: AC
  Administered 2022-01-09: 500 mg via ORAL
  Filled 2022-01-09: qty 1

## 2022-01-09 MED ORDER — OXYCODONE-ACETAMINOPHEN 5-325 MG PO TABS
2.0000 | ORAL_TABLET | Freq: Once | ORAL | Status: AC
  Administered 2022-01-09: 2 via ORAL
  Filled 2022-01-09: qty 2

## 2022-01-09 MED ORDER — IBUPROFEN 400 MG PO TABS: 400.0000 mg | ORAL_TABLET | Freq: Four times a day (QID) | ORAL | 0 refills | Status: AC

## 2022-01-16 ENCOUNTER — Ambulatory Visit
Admission: EM | Admit: 2022-01-16 | Discharge: 2022-01-16 | Disposition: A | Discharge status: Home / Self Care | Payer: Medicaid Other | Attending: Family Medicine | Admitting: Family Medicine

## 2022-01-16 ENCOUNTER — Encounter: Payer: Self-pay | Admitting: Emergency Medicine

## 2022-01-16 ENCOUNTER — Other Ambulatory Visit: Payer: Self-pay

## 2022-01-16 DIAGNOSIS — Z20822 Contact with and (suspected) exposure to covid-19: Secondary | ICD-10-CM | POA: Diagnosis not present

## 2022-01-16 DIAGNOSIS — J069 Acute upper respiratory infection, unspecified: Secondary | ICD-10-CM

## 2022-01-16 DIAGNOSIS — R051 Acute cough: Secondary | ICD-10-CM

## 2022-01-16 LAB — RESP PANEL BY RT-PCR (FLU A&B, COVID) ARPGX2
Influenza A by PCR: NEGATIVE
Influenza B by PCR: NEGATIVE
SARS Coronavirus 2 by RT PCR: NEGATIVE

## 2022-01-16 MED ORDER — PROMETHAZINE-DM 6.25-15 MG/5ML PO SYRP: 5.0000 mL | ORAL_SOLUTION | Freq: Four times a day (QID) | ORAL | 0 refills | Status: DC | PRN

## 2022-01-16 NOTE — ED Triage Notes (Signed)
Pt reports cough, fever, fatigue since this am. Pt reports several coworkers have tested positive for covid. Last dose of tylenol this am.

## 2022-01-16 NOTE — ED Provider Notes (Signed)
[facility]    CSN: [id number] Arrival date & time: 01/16/22  1446      History   Chief Complaint Chief Complaint  Patient presents with   Cough    HPI Brittany Payne is a 31 y.o. female.   Presenting today with 1 day history of cough, fever, fatigue, body aches, chills, sweats, congestion.  Denies chest pain, shortness of breath, abdominal pain, nausea vomiting or diarrhea.  So far trying Tylenol and Mucinex fast max with minimal relief.  Multiple close contacts with COVID recently.  No known history of chronic pulmonary disease.    Past Medical History:  Diagnosis Date   Asthma    Bipolar 1 disorder [facility])    Depression     Patient Active Problem List   Diagnosis Date Noted   Umbilical hernia without obstruction and without gangrene 07/26/2019   Positive urine drug screen 12/31/2018   Anemia 02/05/2017   Bipolar disorder ([place]) 02/05/2017   Depression with anxiety 02/05/2017   Enlargement of spleen 02/05/2017   Migraine 02/05/2017    Past Surgical History:  Procedure Laterality Date   CESAREAN SECTION N/A    CHOLECYSTECTOMY     HERNIA REPAIR     MYRINGOTOMY     TUBAL LIGATION      OB History     Gravida  3   Para  2   Term  2   Preterm      AB      Living  2      SAB      IAB      Ectopic      Multiple      Live Births               Home Medications    Prior to Admission medications   Medication Sig Start Date End Date Taking? Authorizing Provider  promethazine-dextromethorphan (PROMETHAZINE-DM) 6.25-15 MG/5ML syrup Take 5 mLs by mouth 4 (four) times daily as needed. 01/16/22  Yes Volney American, PA-C  albuterol (VENTOLIN HFA) 108 (90 Base) MCG/ACT inhaler Inhale 2 puffs into the lungs every 4 (four) hours as needed for wheezing or shortness of breath.    [provider]  atorvastatin (LIPITOR) 10 MG tablet Take 1 tablet (10 mg total) by mouth every other day. Patient not taking: Reported on 08/23/2021  03/19/21 06/17/21  Fay Records, MD  cyclobenzaprine (FLEXERIL) 10 MG tablet Take 1 tablet (10 mg total) by mouth 2 (two) times daily as needed for muscle spasms. 01/09/22   Mesner, Corene Cornea, MD  ibuprofen (ADVIL) 800 MG tablet Take 1 tablet (800 mg total) by mouth every 8 (eight) hours as needed for moderate pain. 01/08/22   Milton Ferguson, MD  ipratropium (ATROVENT) 0.03 % nasal spray Place 2 sprays into both nostrils 2 (two) times daily. Patient not taking: Reported on 08/23/2021 07/10/20   Scot Jun, FNP  ipratropium-albuterol (DUONEB) 0.5-2.5 (3) MG/3ML SOLN Take 3 mLs by nebulization every 6 (six) hours as needed. Patient not taking: Reported on 04/19/2021 03/08/21   Sherrell Puller, PA-C  loperamide (IMODIUM) 2 MG capsule Take 1 capsule (2 mg total) by mouth 4 (four) times daily as needed for diarrhea or loose stools. Patient not taking: Reported on 03/19/2021 10/27/20   Evalee Jefferson, PA-C  metoprolol succinate (TOPROL XL) 25 MG 24 hr tablet Take 0.5 tablets (12.5 mg total) by mouth as directed. 03/19/21   Fay Records, MD  montelukast (SINGULAIR) 10 MG tablet  Take 10 mg by mouth at bedtime.    [provider]  ondansetron (ZOFRAN-ODT) 4 MG disintegrating tablet 4mg  ODT q4 hours prn nausea/vomit 01/08/22   01/10/22, MD  ondansetron (ZOFRAN-ODT) 4 MG disintegrating tablet Take 1 tablet (4 mg total) by mouth every 8 (eight) hours as needed for vomiting. 4mg  ODT q4 hours prn nausea/vomit 01/09/22   Mesner, , MD  prochlorperazine (COMPAZINE) 10 MG tablet Take 1 tablet (10 mg total) by mouth every 6 (six) hours as needed for nausea or vomiting. Patient not taking: Reported on 08/23/2021 04/26/21   08/25/2021, MD  promethazine-dextromethorphan (PROMETHAZINE-DM) 6.25-15 MG/5ML syrup Take 5 mLs by mouth 4 (four) times daily as needed for cough. Patient not taking: Reported on 08/23/2021 06/20/21   08/25/2021, MD  traMADol (ULTRAM) 50 MG tablet Take 0.5 tablets (25 mg total) by mouth  every 6 (six) hours as needed for severe pain. Patient not taking: Reported on 08/23/2021 04/19/21   08/25/2021, PA-C  cetirizine (ZYRTEC) 10 MG tablet Take 1 tablet (10 mg total) by mouth daily. Patient not taking: Reported on 03/06/2020 09/07/19 03/16/20  09/09/19, PA-C    Family History Family History  Problem Relation Age of Onset   Hypertension Mother    Diabetes Father    Hypertension Father    Cancer Father    Lung cancer Father    Breast cancer Maternal Uncle 66   Diabetes Other     Social History Social History   Tobacco Use   Smoking status: Every Day    Packs/day: 0.50    Years: 8.00    Total pack years: 4.00    Types: Cigarettes    Last attempt to quit: 12/21/2015    Years since quitting: 6.0   Smokeless tobacco: Never  Vaping Use   Vaping Use: Never used  Substance Use Topics   Alcohol use: No   Drug use: Not Currently    Types: Marijuana     Allergies   Yellow jacket venom, Amoxicillin, Celexa [citalopram], Penicillins, and Vistaril [hydroxyzine hcl]   Review of Systems Review of Systems Per HPI  Physical Exam Triage Vital Signs ED Triage Vitals  Enc Vitals Group     BP 01/16/22 1717 (!) 138/91     Pulse Rate 01/16/22 1717 92     Resp 01/16/22 1717 20     Temp 01/16/22 1717 98.3 F (36.8 C)     Temp Source 01/16/22 1717 Oral     SpO2 01/16/22 1717 97 %     Weight --      Height --      Head Circumference --      Peak Flow --      Pain Score 01/16/22 1718 7     Pain Loc --      Pain Edu? --      Excl. in GC? --    No data found.  Updated Vital Signs BP (!) 138/91 (BP Location: Right Arm)   Pulse 92   Temp 98.3 F (36.8 C) (Oral)   Resp 20   LMP 12/29/2021   SpO2 97%   Visual Acuity Right Eye Distance:   Left Eye Distance:   Bilateral Distance:    Right Eye Near:   Left Eye Near:    Bilateral Near:     Physical Exam Vitals and nursing note reviewed.  Constitutional:      Appearance: Normal appearance.   HENT:     Head: Atraumatic.  Right Ear: Tympanic membrane and external ear normal.     Left Ear: Tympanic membrane and external ear normal.     Nose: Rhinorrhea present.     Mouth/Throat:     Mouth: Mucous membranes are moist.     Pharynx: Posterior oropharyngeal erythema present.  Eyes:     Extraocular Movements: Extraocular movements intact.     Conjunctiva/sclera: Conjunctivae normal.  Cardiovascular:     Rate and Rhythm: Normal rate and regular rhythm.     Heart sounds: Normal heart sounds.  Pulmonary:     Effort: Pulmonary effort is normal.     Breath sounds: Normal breath sounds. No wheezing.  Musculoskeletal:        General: Normal range of motion.     Cervical back: Normal range of motion and neck supple.  Skin:    General: Skin is warm and dry.  Neurological:     Mental Status: She is alert and oriented to person, place, and time.  Psychiatric:        Mood and Affect: Mood normal.        Thought Content: Thought content normal.      UC Treatments / Results  Labs (all labs ordered are listed, but only abnormal results are displayed) Labs Reviewed  RESP PANEL BY RT-PCR (FLU A&B, COVID) ARPGX2    EKG   Radiology No results found.  Procedures Procedures (including critical care time)  Medications Ordered in UC Medications - No data to display  Initial Impression / Assessment and Plan / UC Course  I have reviewed the triage vital signs and the nursing notes.  Pertinent labs & imaging results that were available during my care of the patient were reviewed by me and considered in my medical decision making (see chart for details).     Vitals and exam reassuring and suspicious for viral upper respiratory infection.  COVID and flu testing pending, patient is open to antiviral therapy with molnupiravir if COVID-positive.  Treat with Phenergan DM, supportive over-the-counter medications and home care.  Return for worsening symptoms.  Note given.  Final  Clinical Impressions(s) / UC Diagnoses   Final diagnoses:  Acute cough  Viral URI with cough  Exposure to COVID-19 virus   Discharge Instructions   None    ED Prescriptions     Medication Sig Dispense Auth. Provider   promethazine-dextromethorphan (PROMETHAZINE-DM) 6.25-15 MG/5ML syrup Take 5 mLs by mouth 4 (four) times daily as needed. 100 mL Particia Nearing, [place]      PDMP not reviewed this encounter.   Particia Nearing, [place] 01/16/22 1805

## 2022-02-16 ENCOUNTER — Other Ambulatory Visit: Payer: Self-pay

## 2022-02-16 ENCOUNTER — Emergency Department (HOSPITAL_COMMUNITY)
Admission: EM | Admit: 2022-02-16 | Discharge: 2022-02-16 | Disposition: A | Discharge status: Home / Self Care | Payer: Medicaid Other | Attending: Emergency Medicine | Admitting: Emergency Medicine

## 2022-02-16 ENCOUNTER — Emergency Department (HOSPITAL_COMMUNITY): Payer: Medicaid Other

## 2022-02-16 ENCOUNTER — Encounter (HOSPITAL_COMMUNITY): Payer: Self-pay | Admitting: Emergency Medicine

## 2022-02-16 DIAGNOSIS — Z79899 Other long term (current) drug therapy: Secondary | ICD-10-CM | POA: Diagnosis not present

## 2022-02-16 DIAGNOSIS — B349 Viral infection, unspecified: Secondary | ICD-10-CM | POA: Insufficient documentation

## 2022-02-16 DIAGNOSIS — R112 Nausea with vomiting, unspecified: Secondary | ICD-10-CM | POA: Diagnosis present

## 2022-02-16 DIAGNOSIS — Z20822 Contact with and (suspected) exposure to covid-19: Secondary | ICD-10-CM | POA: Insufficient documentation

## 2022-02-16 LAB — PREGNANCY, URINE: Preg Test, Ur: NEGATIVE

## 2022-02-16 LAB — RESP PANEL BY RT-PCR (FLU A&B, COVID) ARPGX2
Influenza A by PCR: NEGATIVE
Influenza B by PCR: NEGATIVE
SARS Coronavirus 2 by RT PCR: NEGATIVE

## 2022-02-16 MED ORDER — ONDANSETRON HCL 4 MG PO TABS: 4.0000 mg | ORAL_TABLET | Freq: Four times a day (QID) | ORAL | 0 refills | Status: DC

## 2022-02-16 MED ORDER — ONDANSETRON 4 MG PO TBDP
4.0000 mg | ORAL_TABLET | Freq: Once | ORAL | Status: AC
  Administered 2022-02-16: 4 mg via ORAL
  Filled 2022-02-16: qty 1

## 2022-02-16 MED ORDER — ALBUTEROL SULFATE HFA 108 (90 BASE) MCG/ACT IN AERS
2.0000 | INHALATION_SPRAY | Freq: Once | RESPIRATORY_TRACT | Status: AC
  Administered 2022-02-16: 2 via RESPIRATORY_TRACT
  Filled 2022-02-16: qty 6.7

## 2022-02-16 NOTE — Discharge Instructions (Addendum)
Please retake your COVID test in 2 to 3 days.  Isolate from others.  Eat bland foods, and use nausea medication as needed.  Make sure you are keeping yourself hydrated and push lots of fluids.  Return to the ER if you are unable to keep anything down, or stop making urine. 

## 2022-02-16 NOTE — ED Provider Notes (Signed)
[facility] EMERGENCY DEPARTMENT Provider Note   CSN: [id number] Arrival date & time: 02/16/22  2035     History  Chief Complaint  Patient presents with   Emesis   Diarrhea    Brittany Payne is a 31 y.o. female, history of tobacco abuse, who presents to the ED secondary to nausea, vomiting, and diarrhea for 1 day.  She also states that she is a bit short of breath.  She states that her daughter is sick as well, and it was sick yesterday, and then she started feeling unwell today.  She has had around 4 episodes of vomiting, and 2 episodes of diarrhea.  Feels very sick to her stomach, but still can eat and drink.  Has not had any fevers or chills.  No runny nose, sore throat. Diarrhea and vomitus is non-bloody.    Emesis Associated symptoms: diarrhea   Associated symptoms: no abdominal pain, no chills and no fever   Diarrhea Associated symptoms: vomiting   Associated symptoms: no abdominal pain, no chills and no fever        Home Medications Prior to Admission medications   Medication Sig Start Date End Date Taking? Authorizing Provider  ondansetron (ZOFRAN) 4 MG tablet Take 1 tablet (4 mg total) by mouth every 6 (six) hours. 02/16/22  Yes Georgana Romain L, PA  albuterol (VENTOLIN HFA) 108 (90 Base) MCG/ACT inhaler Inhale 2 puffs into the lungs every 4 (four) hours as needed for wheezing or shortness of breath.    [provider]  atorvastatin (LIPITOR) 10 MG tablet Take 1 tablet (10 mg total) by mouth every other day. Patient not taking: Reported on 08/23/2021 03/19/21 06/17/21  Pricilla Riffle, MD  cyclobenzaprine (FLEXERIL) 10 MG tablet Take 1 tablet (10 mg total) by mouth 2 (two) times daily as needed for muscle spasms. 01/09/22   Mesner, Barbara Cower, MD  ibuprofen (ADVIL) 800 MG tablet Take 1 tablet (800 mg total) by mouth every 8 (eight) hours as needed for moderate pain. 01/08/22   Bethann Berkshire, MD  ipratropium (ATROVENT) 0.03 % nasal spray Place 2 sprays into both nostrils 2  (two) times daily. Patient not taking: Reported on 08/23/2021 07/10/20   Bing Neighbors, FNP  ipratropium-albuterol (DUONEB) 0.5-2.5 (3) MG/3ML SOLN Take 3 mLs by nebulization every 6 (six) hours as needed. Patient not taking: Reported on 04/19/2021 03/08/21   Achille Rich, PA-C  loperamide (IMODIUM) 2 MG capsule Take 1 capsule (2 mg total) by mouth 4 (four) times daily as needed for diarrhea or loose stools. Patient not taking: Reported on 03/19/2021 10/27/20   Burgess Amor, PA-C  metoprolol succinate (TOPROL XL) 25 MG 24 hr tablet Take 0.5 tablets (12.5 mg total) by mouth as directed. 03/19/21   Pricilla Riffle, MD  montelukast (SINGULAIR) 10 MG tablet Take 10 mg by mouth at bedtime.    [provider]  ondansetron (ZOFRAN-ODT) 4 MG disintegrating tablet 4mg  ODT q4 hours prn nausea/vomit 01/08/22   01/10/22, MD  ondansetron (ZOFRAN-ODT) 4 MG disintegrating tablet Take 1 tablet (4 mg total) by mouth every 8 (eight) hours as needed for vomiting. 4mg  ODT q4 hours prn nausea/vomit 01/09/22   Mesner, , MD  prochlorperazine (COMPAZINE) 10 MG tablet Take 1 tablet (10 mg total) by mouth every 6 (six) hours as needed for nausea or vomiting. Patient not taking: Reported on 08/23/2021 04/26/21   08/25/2021, MD  promethazine-dextromethorphan (PROMETHAZINE-DM) 6.25-15 MG/5ML syrup Take 5 mLs by mouth 4 (four) times daily  as needed for cough. Patient not taking: Reported on 08/23/2021 06/20/21   Vanessa Kick, MD  promethazine-dextromethorphan (PROMETHAZINE-DM) 6.25-15 MG/5ML syrup Take 5 mLs by mouth 4 (four) times daily as needed. 01/16/22   Volney American, PA-C  traMADol (ULTRAM) 50 MG tablet Take 0.5 tablets (25 mg total) by mouth every 6 (six) hours as needed for severe pain. Patient not taking: Reported on 08/23/2021 04/19/21   Harriett Rush, PA-C  cetirizine (ZYRTEC) 10 MG tablet Take 1 tablet (10 mg total) by mouth daily. Patient not taking: Reported on 03/06/2020 09/07/19  03/16/20  Kem Parkinson, PA-C      Allergies    Yellow jacket venom, Amoxicillin, Celexa [citalopram], Penicillins, and Vistaril [hydroxyzine hcl]    Review of Systems   Review of Systems  Constitutional:  Negative for chills and fever.  Gastrointestinal:  Positive for diarrhea, nausea and vomiting. Negative for abdominal pain.    Physical Exam Updated Vital Signs BP 128/82 (BP Location: Right Arm)   Pulse 78   Temp 97.9 F (36.6 C) (Oral)   Resp 18   LMP 02/01/2022   SpO2 99%  Physical Exam Vitals and nursing note reviewed.  Constitutional:      Appearance: Normal appearance.  HENT:     Head: Normocephalic and atraumatic.     Nose: Nose normal.     Mouth/Throat:     Mouth: Mucous membranes are moist.  Eyes:     Extraocular Movements: Extraocular movements intact.     Conjunctiva/sclera: Conjunctivae normal.     Pupils: Pupils are equal, round, and reactive to light.  Cardiovascular:     Rate and Rhythm: Normal rate and regular rhythm.  Pulmonary:     Effort: No tachypnea, accessory muscle usage, respiratory distress or retractions.     Comments: Coarse breath sounds throughout Abdominal:     General: Abdomen is flat. Bowel sounds are normal.     Palpations: Abdomen is soft.  Musculoskeletal:        General: Normal range of motion.     Cervical back: Normal range of motion and neck supple.  Skin:    General: Skin is warm and dry.     Capillary Refill: Capillary refill takes less than 2 seconds.  Neurological:     General: No focal deficit present.     Mental Status: She is alert.  Psychiatric:        Mood and Affect: Mood normal.        Thought Content: Thought content normal.     ED Results / Procedures / Treatments   Labs (all labs ordered are listed, but only abnormal results are displayed) Labs Reviewed  RESP PANEL BY RT-PCR (FLU A&B, COVID) ARPGX2  PREGNANCY, URINE    EKG None  Radiology DG Chest 2 View  Result Date: 02/16/2022 CLINICAL  DATA:  Shortness of breath. EXAM: CHEST - 2 VIEW COMPARISON:  March 08, 2021 FINDINGS: The heart size and mediastinal contours are within normal limits. Both lungs are clear. The visualized skeletal structures are unremarkable. IMPRESSION: No active cardiopulmonary disease. Electronically Signed   By: Virgina Norfolk M.D.   On: 02/16/2022 21:38    Procedures Procedures   Medications Ordered in ED Medications  ondansetron (ZOFRAN-ODT) disintegrating tablet 4 mg (4 mg Oral Given 02/16/22 2110)  albuterol (VENTOLIN HFA) 108 (90 Base) MCG/ACT inhaler 2 puff (2 puffs Inhalation Given 02/16/22 2115)    ED Course/ Medical Decision Making/ A&P  Medical Decision Making Amount and/or Complexity of Data Reviewed Labs: ordered. Radiology: ordered.  Risk Prescription drug management.  Patient is a 31 year old female, here for nausea, vomiting, diarrhea, started today.  Has been able to eat and drink appropriately.  Coarse lung sounds of the lungs, chest x-ray normal.  Likely secondary to upper respiratory viral infection, versus smoking.  COVID/flu negative, however concern for possible COVID as her daughter was recently sitting by a COVID-positive child on Friday.  Discussed repeat testing, and isolation to prevent spread.  Zofran sent to pharmacy.  Able to p.o. trial successfully.  Well-appearing.  Not warrant admission secondary to well-appearing status, and able to tolerate p.o. intake.  Final Clinical Impression(s) / ED Diagnoses Final diagnoses:  Systemic viral illness    Rx / DC Orders ED Discharge Orders          Ordered    ondansetron (ZOFRAN) 4 MG tablet  Every 6 hours        02/16/22 2210              Makaylah Oddo Elbert Ewings, [place] 02/16/22 2322    Gerhard Munch, MD 02/17/22 1826

## 2022-02-16 NOTE — ED Triage Notes (Signed)
Pt here with daughter who is also being seen for same. Pt c/o n/v/d. Denies any pain and states she is able to hold fluids down. Symptoms since last night. Color wnl. Nad. A/o

## 2022-03-01 DIAGNOSIS — M25561 Pain in right knee: Secondary | ICD-10-CM | POA: Diagnosis present

## 2022-03-01 DIAGNOSIS — X509XXA Other and unspecified overexertion or strenuous movements or postures, initial encounter: Secondary | ICD-10-CM | POA: Insufficient documentation

## 2022-03-01 DIAGNOSIS — J45909 Unspecified asthma, uncomplicated: Secondary | ICD-10-CM | POA: Insufficient documentation

## 2022-03-02 ENCOUNTER — Other Ambulatory Visit: Payer: Self-pay

## 2022-03-02 ENCOUNTER — Emergency Department (HOSPITAL_COMMUNITY)
Admission: EM | Admit: 2022-03-02 | Discharge: 2022-03-02 | Disposition: A | Discharge status: Home / Self Care | Payer: Medicaid Other | Attending: Emergency Medicine | Admitting: Emergency Medicine

## 2022-03-02 ENCOUNTER — Encounter (HOSPITAL_COMMUNITY): Payer: Self-pay

## 2022-03-02 ENCOUNTER — Emergency Department (HOSPITAL_COMMUNITY): Payer: Medicaid Other

## 2022-03-02 DIAGNOSIS — M25561 Pain in right knee: Secondary | ICD-10-CM

## 2022-03-02 NOTE — Discharge Instructions (Signed)
You were seen today for knee pain.  Your x-ray is negative for fracture.  You may have a mild sprain.  Ice and elevate.  Take ibuprofen as needed for pain.  You may return to work as tolerated.

## 2022-03-02 NOTE — ED Provider Notes (Signed)
[facility] EMERGENCY DEPARTMENT Provider Note   CSN: [id number] Arrival date & time: 03/01/22  2357     History  Chief Complaint  Patient presents with   Knee Pain    Brittany Payne is a 31 y.o. female.  HPI     This is a 31 year old female who presents with right knee pain.  Patient reports that she was at work and she noted that her knee "popped and gave way."  She did not fall.  Denies any prior issues or injury to that knee.  She states that it is sore.  She did take ibuprofen prior to arrival.  She has been ambulatory.  Her employer recommended she be evaluated to be cleared to come back to work.  Home Medications Prior to Admission medications   Medication Sig Start Date End Date Taking? Authorizing Provider  albuterol (VENTOLIN HFA) 108 (90 Base) MCG/ACT inhaler Inhale 2 puffs into the lungs every 4 (four) hours as needed for wheezing or shortness of breath.    [provider]  atorvastatin (LIPITOR) 10 MG tablet Take 1 tablet (10 mg total) by mouth every other day. Patient not taking: Reported on 08/23/2021 03/19/21 06/17/21  Pricilla Riffle, MD  cyclobenzaprine (FLEXERIL) 10 MG tablet Take 1 tablet (10 mg total) by mouth 2 (two) times daily as needed for muscle spasms. 01/09/22   Mesner, Barbara Cower, MD  ibuprofen (ADVIL) 800 MG tablet Take 1 tablet (800 mg total) by mouth every 8 (eight) hours as needed for moderate pain. 01/08/22   Bethann Berkshire, MD  ipratropium (ATROVENT) 0.03 % nasal spray Place 2 sprays into both nostrils 2 (two) times daily. Patient not taking: Reported on 08/23/2021 07/10/20   Bing Neighbors, FNP  ipratropium-albuterol (DUONEB) 0.5-2.5 (3) MG/3ML SOLN Take 3 mLs by nebulization every 6 (six) hours as needed. Patient not taking: Reported on 04/19/2021 03/08/21   Achille Rich, PA-C  loperamide (IMODIUM) 2 MG capsule Take 1 capsule (2 mg total) by mouth 4 (four) times daily as needed for diarrhea or loose stools. Patient not taking: Reported on  03/19/2021 10/27/20   Burgess Amor, PA-C  metoprolol succinate (TOPROL XL) 25 MG 24 hr tablet Take 0.5 tablets (12.5 mg total) by mouth as directed. 03/19/21   Pricilla Riffle, MD  montelukast (SINGULAIR) 10 MG tablet Take 10 mg by mouth at bedtime.    [provider]  ondansetron (ZOFRAN) 4 MG tablet Take 1 tablet (4 mg total) by mouth every 6 (six) hours. 02/16/22   Small, Brooke L, PA  ondansetron (ZOFRAN-ODT) 4 MG disintegrating tablet 4mg  ODT q4 hours prn nausea/vomit 01/08/22   01/10/22, MD  ondansetron (ZOFRAN-ODT) 4 MG disintegrating tablet Take 1 tablet (4 mg total) by mouth every 8 (eight) hours as needed for vomiting. 4mg  ODT q4 hours prn nausea/vomit 01/09/22   Mesner, , MD  prochlorperazine (COMPAZINE) 10 MG tablet Take 1 tablet (10 mg total) by mouth every 6 (six) hours as needed for nausea or vomiting. Patient not taking: Reported on 08/23/2021 04/26/21   08/25/2021, MD  promethazine-dextromethorphan (PROMETHAZINE-DM) 6.25-15 MG/5ML syrup Take 5 mLs by mouth 4 (four) times daily as needed for cough. Patient not taking: Reported on 08/23/2021 06/20/21   08/25/2021, MD  promethazine-dextromethorphan (PROMETHAZINE-DM) 6.25-15 MG/5ML syrup Take 5 mLs by mouth 4 (four) times daily as needed. 01/16/22   [birth year]1990, PA-C  traMADol (ULTRAM) 50 MG tablet Take 0.5 tablets (25 mg total) by mouth every 6 (six) hours  as needed for severe pain. Patient not taking: Reported on 08/23/2021 04/19/21   Carnella Guadalajara, PA-C  cetirizine (ZYRTEC) 10 MG tablet Take 1 tablet (10 mg total) by mouth daily. Patient not taking: Reported on 03/06/2020 09/07/19 03/16/20  Pauline Aus, PA-C      Allergies    Yellow jacket venom, Amoxicillin, Celexa [citalopram], Penicillins, and Vistaril [hydroxyzine hcl]    Review of Systems   Review of Systems  Musculoskeletal:        Knee pain  All other systems reviewed and are negative.   Physical Exam Updated Vital Signs BP 117/76    Pulse 78   Temp 97.9 F (36.6 C)   Resp 16   Ht 1.575 m (5\' 2" )   Wt 66.2 kg   LMP 02/16/2022 (Exact Date)   SpO2 100%   BMI 26.70 kg/m  Physical Exam Vitals and nursing note reviewed.  Constitutional:      Appearance: She is well-developed. She is not ill-appearing.  HENT:     Head: Normocephalic and atraumatic.  Eyes:     Pupils: Pupils are equal, round, and reactive to light.  Cardiovascular:     Rate and Rhythm: Normal rate and regular rhythm.  Pulmonary:     Effort: Pulmonary effort is normal. No respiratory distress.  Abdominal:     Palpations: Abdomen is soft.  Musculoskeletal:     Cervical back: Neck supple.     Comments: Normal range of motion of the right knee, tenderness to palpation over the lateral joint line, no obvious effusion, no overlying skin changes, no obvious deformities  Skin:    General: Skin is warm and dry.  Neurological:     Mental Status: She is alert and oriented to person, place, and time.  Psychiatric:        Mood and Affect: Mood normal.     ED Results / Procedures / Treatments   Labs (all labs ordered are listed, but only abnormal results are displayed) Labs Reviewed - No data to display  EKG None  Radiology DG Knee Complete 4 Views Right  Result Date: 03/02/2022 CLINICAL DATA:  Right knee pain after a pop. EXAM: RIGHT KNEE - COMPLETE 4+ VIEW COMPARISON:  None Available. FINDINGS: No evidence of fracture, dislocation, or joint effusion. No evidence of arthropathy or other focal bone abnormality. Soft tissues are unremarkable. IMPRESSION: Negative. Electronically Signed   By: 03/04/2022 M.D.   On: 03/02/2022 01:19    Procedures Procedures    Medications Ordered in ED Medications - No data to display  ED Course/ Medical Decision Making/ A&P                           Medical Decision Making Amount and/or Complexity of Data Reviewed Radiology: ordered.   This patient presents to the ED for concern of knee pain, this  involves an extensive number of treatment options, and is a complaint that carries with it a high risk of complications and morbidity.  I considered the following differential and admission for this acute, potentially life threatening condition.  The differential diagnosis includes sprain, meniscus injury, less likely fracture  MDM:    This is a 31 year old female who presents with some right knee pain.  Reports a pop at work.  Denies any significant traumatic injury.  She has been ambulatory.  She took ibuprofen prior to arrival.  Knee exam is largely reassuring.  There is no joint line tenderness  but no effusion or deformities.  She can bear weight.  X-rays obtained and show no evidence of fracture.  Recommend ice and elevation.  Ibuprofen for pain.  (Labs, imaging, consults)  Labs: I Ordered, and personally interpreted labs.  The pertinent results include: None  Imaging Studies ordered: I ordered imaging studies including x-ray right knee I independently visualized and interpreted imaging. I agree with the radiologist interpretation  Additional history obtained from chart review.  External records from outside source obtained and reviewed including prior evaluations  Cardiac Monitoring: The patient was maintained on a cardiac monitor.  I personally viewed and interpreted the cardiac monitored which showed an underlying rhythm of: Sinus rhythm  Reevaluation: After the interventions noted above, I reevaluated the patient and found that they have :stayed the same  Social Determinants of Health: Lives independently  Disposition: Discharge  Co morbidities that complicate the patient evaluation  Past Medical History:  Diagnosis Date   Asthma    Bipolar 1 disorder ([place])    Depression      Medicines No orders of the defined types were placed in this encounter.   I have reviewed the patients home medicines and have made adjustments as needed  Problem List / ED Course: Problem  List Items Addressed This Visit   None Visit Diagnoses     Acute pain of right knee    -  Primary                   Final Clinical Impression(s) / ED Diagnoses Final diagnoses:  Acute pain of right knee    Rx / DC Orders ED Discharge Orders     None         Merryl Hacker, MD 03/02/22 0134

## 2022-03-02 NOTE — ED Triage Notes (Signed)
Pt to ED by POV from home with c/o R knee pain. Pt states her "knee popped" while she was at work, and that her employer wont let her work until she is medically cleared to do so. Pt took Ibuprofen prior to arrival.

## 2022-03-03 ENCOUNTER — Encounter (HOSPITAL_COMMUNITY): Payer: Self-pay

## 2022-03-03 ENCOUNTER — Emergency Department (HOSPITAL_COMMUNITY)
Admission: EM | Admit: 2022-03-03 | Discharge: 2022-03-03 | Disposition: A | Discharge status: Home / Self Care | Payer: Medicaid Other | Attending: Emergency Medicine | Admitting: Emergency Medicine

## 2022-03-03 ENCOUNTER — Other Ambulatory Visit: Payer: Self-pay

## 2022-03-03 DIAGNOSIS — M25561 Pain in right knee: Secondary | ICD-10-CM | POA: Diagnosis not present

## 2022-03-03 MED ORDER — HYDROCODONE-ACETAMINOPHEN 5-325 MG PO TABS: 1.0000 | ORAL_TABLET | ORAL | 0 refills | Status: DC | PRN

## 2022-03-03 NOTE — Discharge Instructions (Signed)
Continue to take the ibuprofen as directed.  Elevate and apply ice packs on and off to your knee.  Wear the brace when walking or standing for added support.  Use crutches as needed.  Call the orthopedic provider later this week to arrange a follow-up appointment if your symptoms are not improving.

## 2022-03-03 NOTE — ED Triage Notes (Signed)
Pt reports right knee pain. Pt states that she can not stand on her right leg. Pain getting worse.

## 2022-03-03 NOTE — ED Provider Notes (Signed)
[facility] EMERGENCY DEPARTMENT Provider Note   CSN: [id number] Arrival date & time: 03/03/22  1517     History  No chief complaint on file.   Brittany Payne is a 31 y.o. female.  HPI     Brittany Payne is a 31 y.o. female who presents to the Emergency Department complaining of worsening of right knee pain.  Seen here yesterday for same.  Pain gradually worsening this morning.  States she is having pain to the lateral and posterior aspect of her knee.  She feels that her knee is going to "give away" with her.  No recent injury.  But states that she believes she hurt her knee at her job.  She denies discoloration, radiating pain or swelling of her knee.  Taking ibuprofen with minimal relief.  Home Medications Prior to Admission medications   Medication Sig Start Date End Date Taking? Authorizing Provider  albuterol (VENTOLIN HFA) 108 (90 Base) MCG/ACT inhaler Inhale 2 puffs into the lungs every 4 (four) hours as needed for wheezing or shortness of breath.    [provider]  atorvastatin (LIPITOR) 10 MG tablet Take 1 tablet (10 mg total) by mouth every other day. Patient not taking: Reported on 08/23/2021 03/19/21 06/17/21  Fay Records, MD  cyclobenzaprine (FLEXERIL) 10 MG tablet Take 1 tablet (10 mg total) by mouth 2 (two) times daily as needed for muscle spasms. 01/09/22   Mesner, Corene Cornea, MD  ibuprofen (ADVIL) 800 MG tablet Take 1 tablet (800 mg total) by mouth every 8 (eight) hours as needed for moderate pain. 01/08/22   Milton Ferguson, MD  ipratropium (ATROVENT) 0.03 % nasal spray Place 2 sprays into both nostrils 2 (two) times daily. Patient not taking: Reported on 08/23/2021 07/10/20   Scot Jun, FNP  ipratropium-albuterol (DUONEB) 0.5-2.5 (3) MG/3ML SOLN Take 3 mLs by nebulization every 6 (six) hours as needed. Patient not taking: Reported on 04/19/2021 03/08/21   Sherrell Puller, PA-C  loperamide (IMODIUM) 2 MG capsule Take 1 capsule (2 mg total) by mouth 4 (four)  times daily as needed for diarrhea or loose stools. Patient not taking: Reported on 03/19/2021 10/27/20   Evalee Jefferson, PA-C  metoprolol succinate (TOPROL XL) 25 MG 24 hr tablet Take 0.5 tablets (12.5 mg total) by mouth as directed. 03/19/21   Fay Records, MD  montelukast (SINGULAIR) 10 MG tablet Take 10 mg by mouth at bedtime.    [provider]  ondansetron (ZOFRAN) 4 MG tablet Take 1 tablet (4 mg total) by mouth every 6 (six) hours. 02/16/22   Small, Brooke L, PA  ondansetron (ZOFRAN-ODT) 4 MG disintegrating tablet 4mg  ODT q4 hours prn nausea/vomit 01/08/22   Milton Ferguson, MD  ondansetron (ZOFRAN-ODT) 4 MG disintegrating tablet Take 1 tablet (4 mg total) by mouth every 8 (eight) hours as needed for vomiting. 4mg  ODT q4 hours prn nausea/vomit 01/09/22   Mesner, Corene Cornea, MD  prochlorperazine (COMPAZINE) 10 MG tablet Take 1 tablet (10 mg total) by mouth every 6 (six) hours as needed for nausea or vomiting. Patient not taking: Reported on 0/86/5784 69/62/95   Delora Fuel, MD  promethazine-dextromethorphan (PROMETHAZINE-DM) 6.25-15 MG/5ML syrup Take 5 mLs by mouth 4 (four) times daily as needed for cough. Patient not taking: Reported on 08/23/2021 06/20/21   Vanessa Kick, MD  promethazine-dextromethorphan (PROMETHAZINE-DM) 6.25-15 MG/5ML syrup Take 5 mLs by mouth 4 (four) times daily as needed. 01/16/22   Volney American, PA-C  traMADol (ULTRAM) 50 MG tablet Take 0.5  tablets (25 mg total) by mouth every 6 (six) hours as needed for severe pain. Patient not taking: Reported on 08/23/2021 04/19/21   Carnella Guadalajara, PA-C  cetirizine (ZYRTEC) 10 MG tablet Take 1 tablet (10 mg total) by mouth daily. Patient not taking: Reported on 03/06/2020 09/07/19 03/16/20  Pauline Aus, PA-C      Allergies    Yellow jacket venom, Amoxicillin, Celexa [citalopram], Penicillins, and Vistaril [hydroxyzine hcl]    Review of Systems   Review of Systems  Constitutional:  Negative for appetite change, chills  and fever.  Respiratory:  Negative for cough and shortness of breath.   Cardiovascular:  Negative for chest pain.  Gastrointestinal:  Negative for abdominal pain.  Musculoskeletal:  Positive for arthralgias. Negative for back pain, neck pain and neck stiffness.  Skin:  Negative for rash and wound.  Neurological:  Negative for dizziness, syncope, weakness and numbness.    Physical Exam Updated Vital Signs BP 102/78 (BP Location: Right Arm)   Pulse 81   Temp 98.4 F (36.9 C) (Oral)   Resp 18   Ht 5\' 2"  (1.575 m)   Wt 66.2 kg   LMP 02/16/2022 (Exact Date)   SpO2 100%   BMI 26.70 kg/m  Physical Exam Vitals and nursing note reviewed.  Constitutional:      General: She is not in acute distress.    Appearance: Normal appearance. She is not ill-appearing or toxic-appearing.  Cardiovascular:     Rate and Rhythm: Normal rate and regular rhythm.     Pulses: Normal pulses.  Pulmonary:     Effort: Pulmonary effort is normal.     Breath sounds: Normal breath sounds.  Abdominal:     Palpations: Abdomen is soft.     Tenderness: There is no abdominal tenderness.  Musculoskeletal:        General: Tenderness and signs of injury present. No swelling. Normal range of motion.     Right knee: Crepitus present. No swelling, effusion or erythema. Normal range of motion.     Comments: Patient able to perform full range of motion at the knee, crepitus on exam.  No palpable effusion excessive warmth or erythema.  Skin:    General: Skin is warm.     Capillary Refill: Capillary refill takes less than 2 seconds.  Neurological:     General: No focal deficit present.     Mental Status: She is alert.     Sensory: No sensory deficit.     Motor: No weakness.     ED Results / Procedures / Treatments   Labs (all labs ordered are listed, but only abnormal results are displayed) Labs Reviewed - No data to display  EKG None  Radiology DG Knee Complete 4 Views Right  Result Date:  03/02/2022 CLINICAL DATA:  Right knee pain after a pop. EXAM: RIGHT KNEE - COMPLETE 4+ VIEW COMPARISON:  None Available. FINDINGS: No evidence of fracture, dislocation, or joint effusion. No evidence of arthropathy or other focal bone abnormality. Soft tissues are unremarkable. IMPRESSION: Negative. Electronically Signed   By: 03/04/2022 M.D.   On: 03/02/2022 01:19    Procedures Procedures    Medications Ordered in ED Medications - No data to display  ED Course/ Medical Decision Making/ A&P                           Medical Decision Making Patient here for continued right knee pain.  Seen here yesterday  for same.  Initially was having improvement after taking over-the-counter ibuprofen.  Today pain worse.  Denies swelling or redness  On exam, patient has some crepitus with range of motion but able to perform full range of motion and bear weight.  Doubt any acute emergent process.  I do not appreciate any erythema or palpable effusion of the knee joint.  Low clinical suspicion for septic joint.  Differential would include but not limited to musculoskeletal injury, Baker's cyst, septic joint, musculoskeletal injury.  She had x-ray yesterday that was negative for acute process.  No indication for repeat imaging today.  Amount and/or Complexity of Data Reviewed Discussion of management or test interpretation with external provider(s): Patient reassured.  Will give knee sleeve and crutches for added support and comfort.  She will continue ibuprofen and ice.  Given follow-up information for orthopedics.  She is agreeable to plan.  Risk Prescription drug management.           Final Clinical Impression(s) / ED Diagnoses Final diagnoses:  Acute pain of right knee    Rx / DC Orders ED Discharge Orders     None         Pauline Aus, PA-C 03/03/22 2314    Lonell Grandchild, MD 03/14/22 [id number]

## 2022-03-03 NOTE — ED Notes (Signed)
Pt moved to treatment room. Pt observed by this RN standing at bedside and ambulating in room w/o assistance.

## 2022-03-04 MED FILL — Hydrocodone-Acetaminophen Tab 5-325 MG: ORAL | Qty: 6 | Status: AC

## 2022-03-11 ENCOUNTER — Telehealth: Payer: Self-pay

## 2022-03-11 NOTE — Telephone Encounter (Signed)
Patient called to schedule an appointment for a ER fol/up. I tried calling patient back to discuss appointment, looking at ER note patient states she may have hurt her knee at work. I was going to let her know that her employer needs to contact us with Worker's Comp information, before we can schedule an appointment. Patient's voicemail box is full.

## 2022-03-13 ENCOUNTER — Ambulatory Visit (INDEPENDENT_AMBULATORY_CARE_PROVIDER_SITE_OTHER): Payer: Medicaid Other | Admitting: Orthopaedic Surgery

## 2022-03-13 ENCOUNTER — Encounter: Payer: Self-pay | Admitting: Orthopaedic Surgery

## 2022-03-13 VITALS — BP 132/69 | HR 84 | Ht 62.0 in | Wt 147.4 lb

## 2022-03-13 DIAGNOSIS — G8929 Other chronic pain: Secondary | ICD-10-CM

## 2022-03-13 DIAGNOSIS — M25561 Pain in right knee: Secondary | ICD-10-CM

## 2022-03-13 MED ORDER — HYDROCODONE-ACETAMINOPHEN 5-325 MG PO TABS: 1.0000 | ORAL_TABLET | ORAL | 0 refills | Status: AC | PRN

## 2022-03-13 MED ORDER — METHYLPREDNISOLONE ACETATE 40 MG/ML IJ SUSP
40.0000 mg | Freq: Once | INTRAMUSCULAR | Status: AC
  Administered 2022-03-13: 40 mg via INTRA_ARTICULAR

## 2022-03-13 NOTE — Addendum Note (Signed)
Addended by: Obie Dredge A on: 03/13/2022 12:29 PM   Modules accepted: Orders

## 2022-03-13 NOTE — Patient Instructions (Signed)
Address: [address] [place], [place] [address] Phone: [phone]  Call and schedule your appointment. See if you are able to get it scheduled for next week. 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  We will work on the authorization.    Where your ace bandage we have provided while you are working.

## 2022-03-13 NOTE — Progress Notes (Signed)
Subjective:    Patient ID: Brittany Payne, female    DOB: 08/29/1990, 31 y.o.   MRN: [id number]  HPI She has had pain in the right knee for over two to three weeks.  She has popping, swelling and giving way.  The giving way is worse and happens often.  She had a twisting event that caused it.  She has been to the ER on 03-02-22 and again on 03-03-22 for this.  I have reviewed the notes.  I have reviewed the X-rays.  She was given a brace and pain medicine and told to come here.  She has no redness, no numbness, no distal edema.  She is not improving, in fact, she is getting worse.  She has tried Tylenol, Advil, heat and ice with no help.  She is concerned that it is not getting better and is worse and she falls.   I have independently reviewed and interpreted x-rays of this patient done at another site by another physician or qualified health professional.   Review of Systems  Constitutional:  Positive for activity change.  Respiratory:  Positive for shortness of breath.   Musculoskeletal:  Positive for arthralgias, gait problem and joint swelling.  All other systems reviewed and are negative. For Review of Systems, all other systems reviewed and are negative.  The following is a summary of the past history medically, past history surgically, known current medicines, social history and family history.  This information is gathered electronically by the computer from prior information and documentation.  I review this each visit and have found including this information at this point in the chart is beneficial and informative.   Past Medical History:  Diagnosis Date   Asthma    Bipolar 1 disorder (HCC)    Depression     Past Surgical History:  Procedure Laterality Date   CESAREAN SECTION N/A    CHOLECYSTECTOMY     HERNIA REPAIR     MYRINGOTOMY     TUBAL LIGATION      Current Outpatient Medications on File Prior to Visit  Medication Sig Dispense Refill   albuterol  (VENTOLIN HFA) 108 (90 Base) MCG/ACT inhaler Inhale 2 puffs into the lungs every 4 (four) hours as needed for wheezing or shortness of breath.     metoprolol succinate (TOPROL XL) 25 MG 24 hr tablet Take 0.5 tablets (12.5 mg total) by mouth as directed. 30 tablet 11   montelukast (SINGULAIR) 10 MG tablet Take 10 mg by mouth at bedtime.     ondansetron (ZOFRAN-ODT) 4 MG disintegrating tablet Take 1 tablet (4 mg total) by mouth every 8 (eight) hours as needed for vomiting. 4mg  ODT q4 hours prn nausea/vomit 30 tablet 0   atorvastatin (LIPITOR) 10 MG tablet Take 1 tablet (10 mg total) by mouth every other day. (Patient not taking: Reported on 08/23/2021) 45 tablet 3   cyclobenzaprine (FLEXERIL) 10 MG tablet Take 1 tablet (10 mg total) by mouth 2 (two) times daily as needed for muscle spasms. (Patient not taking: Reported on 03/13/2022) 20 tablet 0   ibuprofen (ADVIL) 800 MG tablet Take 1 tablet (800 mg total) by mouth every 8 (eight) hours as needed for moderate pain. 21 tablet 0   ipratropium (ATROVENT) 0.03 % nasal spray Place 2 sprays into both nostrils 2 (two) times daily. (Patient not taking: Reported on 08/23/2021) 30 mL 0   ipratropium-albuterol (DUONEB) 0.5-2.5 (3) MG/3ML SOLN Take 3 mLs by nebulization every 6 (six) hours as needed. (  Patient not taking: Reported on 04/19/2021) 360 mL 0   loperamide (IMODIUM) 2 MG capsule Take 1 capsule (2 mg total) by mouth 4 (four) times daily as needed for diarrhea or loose stools. (Patient not taking: Reported on 03/19/2021) 12 capsule 0   ondansetron (ZOFRAN) 4 MG tablet Take 1 tablet (4 mg total) by mouth every 6 (six) hours. (Patient not taking: Reported on 03/13/2022) 12 tablet 0   ondansetron (ZOFRAN-ODT) 4 MG disintegrating tablet 4mg  ODT q4 hours prn nausea/vomit (Patient not taking: Reported on 03/13/2022) 10 tablet 0   prochlorperazine (COMPAZINE) 10 MG tablet Take 1 tablet (10 mg total) by mouth every 6 (six) hours as needed for nausea or vomiting. (Patient  not taking: Reported on 08/23/2021) 20 tablet 0   promethazine-dextromethorphan (PROMETHAZINE-DM) 6.25-15 MG/5ML syrup Take 5 mLs by mouth 4 (four) times daily as needed for cough. (Patient not taking: Reported on 08/23/2021) 118 mL 0   promethazine-dextromethorphan (PROMETHAZINE-DM) 6.25-15 MG/5ML syrup Take 5 mLs by mouth 4 (four) times daily as needed. 100 mL 0   [DISCONTINUED] cetirizine (ZYRTEC) 10 MG tablet Take 1 tablet (10 mg total) by mouth daily. (Patient not taking: Reported on 03/06/2020) 21 tablet 0   No current facility-administered medications on file prior to visit.    Social History   Socioeconomic History   Marital status: Single    Spouse name: Not on file   Number of children: 4   Years of education: Not on file   Highest education level: Not on file  Occupational History   Not on file  Tobacco Use   Smoking status: Every Day    Packs/day: 0.50    Years: 8.00    Total pack years: 4.00    Types: Cigarettes    Last attempt to quit: 12/21/2015    Years since quitting: 6.2   Smokeless tobacco: Never  Vaping Use   Vaping Use: Never used  Substance and Sexual Activity   Alcohol use: No   Drug use: Not Currently    Types: Marijuana   Sexual activity: Yes    Birth control/protection: None  Other Topics Concern   Not on file  Social History Narrative   Not on file   Social Determinants of Health   Financial Resource Strain: Low Risk  (11/07/2020)   Overall Financial Resource Strain (CARDIA)    Difficulty of Paying Living Expenses: Not hard at all  Food Insecurity: No Food Insecurity (11/07/2020)   Hunger Vital Sign    Worried About Running Out of Food in the Last Year: Never true    Ran Out of Food in the Last Year: Never true  Transportation Needs: No Transportation Needs (11/07/2020)   PRAPARE - Hydrologist (Medical): No    Lack of Transportation (Non-Medical): No  Physical Activity: Sufficiently Active (11/07/2020)   Exercise  Vital Sign    Days of Exercise per Week: 7 days    Minutes of Exercise per Session: 60 min  Stress: No Stress Concern Present (11/07/2020)   [place]    Feeling of Stress : Not at all  Social Connections: Moderately Integrated (11/07/2020)   Social Connection and Isolation Panel [NHANES]    Frequency of Communication with Friends and Family: More than three times a week    Frequency of Social Gatherings with Friends and Family: Once a week    Attends Religious Services: More than 4 times per year  Active Member of Clubs or Organizations: No    Attends Banker Meetings: Never    Marital Status: Living with partner  Intimate Partner Violence: Not on file    Family History  Problem Relation Age of Onset   Hypertension Mother    Diabetes Father    Hypertension Father    Cancer Father    Lung cancer Father    Breast cancer Maternal Uncle 34   Diabetes Other     BP 132/69   Pulse 84   Ht 5\' 2"  (1.575 m)   Wt 147 lb 6.4 oz (66.9 kg)   LMP 02/16/2022 (Exact Date)   BMI 26.96 kg/m   Body mass index is 26.96 kg/m.      Objective:   Physical Exam Vitals and nursing note reviewed. Exam conducted with a chaperone present.  Constitutional:      Appearance: She is well-developed.  HENT:     Head: Normocephalic and atraumatic.  Eyes:     Conjunctiva/sclera: Conjunctivae normal.     Pupils: Pupils are equal, round, and reactive to light.  Cardiovascular:     Rate and Rhythm: Normal rate and regular rhythm.  Pulmonary:     Effort: Pulmonary effort is normal.  Abdominal:     Palpations: Abdomen is soft.  Musculoskeletal:     Cervical back: Normal range of motion and neck supple.       Legs:  Skin:    General: Skin is warm and dry.  Neurological:     Mental Status: She is alert and oriented to person, place, and time.     Cranial Nerves: No cranial nerve deficit.     Motor: No abnormal  muscle tone.     Coordination: Coordination normal.     Deep Tendon Reflexes: Reflexes are normal and symmetric. Reflexes normal.  Psychiatric:        Behavior: Behavior normal.        Thought Content: Thought content normal.        Judgment: Judgment normal.           Assessment & Plan:   Encounter Diagnosis  Name Primary?   Chronic pain of right knee Yes   I am concerned about ligament strain and meniscus tear laterally of the knee.  I feel she needs MRI as she has not improved over the last 10 days with treatment.  I will give pain medicine.  I have reviewed the 04/18/2022 Controlled Substance Reporting System web site prior to prescribing narcotic medicine for this patient.  PROCEDURE NOTE:  The patient requests injections of the right knee , verbal consent was obtained.  The right knee was prepped appropriately after time out was performed.   Sterile technique was observed and injection of 1 cc of DepoMedrol 40mg  with several cc's of plain xylocaine. Anesthesia was provided by ethyl chloride and a 20-gauge needle was used to inject the knee area. The injection was tolerated well.  A band aid dressing was applied.  The patient was advised to apply ice later today and tomorrow to the injection sight as needed.  Get the MRI.  Ace bandages given.  Return in one week.  Call if any problem.  Precautions discussed.  Electronically Signed [place], MD 11/1/202310:43 AM

## 2022-03-15 ENCOUNTER — Ambulatory Visit (HOSPITAL_BASED_OUTPATIENT_CLINIC_OR_DEPARTMENT_OTHER): Payer: Medicaid Other

## 2022-03-19 ENCOUNTER — Ambulatory Visit: Payer: Medicaid Other | Admitting: Orthopaedic Surgery

## 2022-03-21 ENCOUNTER — Encounter: Payer: Self-pay | Admitting: Orthopaedic Surgery

## 2022-03-22 ENCOUNTER — Ambulatory Visit (HOSPITAL_BASED_OUTPATIENT_CLINIC_OR_DEPARTMENT_OTHER): Admission: RE | Admit: 2022-03-22 | Payer: Medicaid Other | Source: Ambulatory Visit

## 2022-03-26 ENCOUNTER — Encounter: Payer: Self-pay | Admitting: Orthopaedic Surgery

## 2022-03-26 ENCOUNTER — Ambulatory Visit: Payer: Medicaid Other | Admitting: Orthopaedic Surgery

## 2022-03-26 ENCOUNTER — Ambulatory Visit (INDEPENDENT_AMBULATORY_CARE_PROVIDER_SITE_OTHER): Payer: Medicaid Other | Admitting: Orthopaedic Surgery

## 2022-03-26 VITALS — Ht 61.0 in | Wt 146.0 lb

## 2022-03-26 DIAGNOSIS — M25561 Pain in right knee: Secondary | ICD-10-CM | POA: Diagnosis not present

## 2022-03-26 DIAGNOSIS — G8929 Other chronic pain: Secondary | ICD-10-CM | POA: Diagnosis not present

## 2022-03-26 MED ORDER — METHYLPREDNISOLONE ACETATE 40 MG/ML IJ SUSP
40.0000 mg | Freq: Once | INTRAMUSCULAR | Status: AC
  Administered 2022-03-26: 40 mg via INTRA_ARTICULAR

## 2022-03-26 MED ORDER — HYDROCODONE-ACETAMINOPHEN 5-325 MG PO TABS: ORAL_TABLET | ORAL | 0 refills | Status: DC

## 2022-03-26 NOTE — Progress Notes (Signed)
I could not get my MRI  She had severe car trouble going to the MRI for the right knee.  It has been rescheduled on December 1.  She has right knee pain.  PROCEDURE NOTE:  The patient requests injections of the right knee , verbal consent was obtained.  The right knee was prepped appropriately after time out was performed.   Sterile technique was observed and injection of 1 cc of DepoMedrol 40mg  with several cc's of plain xylocaine. Anesthesia was provided by ethyl chloride and a 20-gauge needle was used to inject the knee area. The injection was tolerated well.  A band aid dressing was applied.  The patient was advised to apply ice later today and tomorrow to the injection sight as needed.  Encounter Diagnosis  Name Primary?   Chronic pain of right knee Yes   I have reviewed the Controlled Substance Reporting System web site prior to prescribing narcotic medicine for this patient.  Return in three weeks.  Get the MRI of the right knee.  Call if any problem.  Precautions discussed.  Electronically Signed [place], MD 11/14/20239:34 AM

## 2022-03-26 NOTE — Addendum Note (Signed)
Addended by: Recardo Evangelist A on: 03/26/2022 11:43 AM   Modules accepted: Orders

## 2022-03-26 NOTE — Patient Instructions (Addendum)
Note for work: no bending due to knee pain.  Smoking Tobacco Information, Adult Smoking tobacco can be harmful to your health. Tobacco contains a toxic colorless chemical called nicotine. Nicotine causes changes in your brain that make you want more and more. This is called addiction. This can make it hard to stop smoking once you start. Tobacco also has other toxic chemicals that can hurt your body and raise your risk of many cancers. Menthol or "lite" tobacco or cigarette brands are not safer than regular brands. How can smoking tobacco affect me? Smoking tobacco puts you at risk for: Cancer. Smoking is most commonly associated with lung cancer, but can also lead to cancer in other parts of the body. Chronic obstructive pulmonary disease (COPD). This is a long-term lung condition that makes it hard to breathe. It also gets worse over time. High blood pressure (hypertension), heart disease, stroke, heart attack, and lung infections, such as pneumonia. Cataracts. This is when the lenses in the eyes become clouded. Digestive problems. This may include peptic ulcers, heartburn, and gastroesophageal reflux disease (GERD). Oral health problems, such as gum disease, mouth sores, and tooth loss. Loss of taste and smell. Smoking also affects how you look and smell. Smoking may cause: Wrinkles. Yellow or stained teeth, fingers, and fingernails. Bad breath. Bad-smelling clothes and hair. Smoking tobacco can also affect your social life, because: It may be challenging to find places to smoke when away from home. Many workplaces, [employer], hotels, and public places are tobacco-free. Smoking is expensive. This is due to the cost of tobacco and the long-term costs of treating health problems from smoking. Secondhand smoke may affect those around you. Secondhand smoke can cause lung cancer, breathing problems, and heart disease. Children of smokers have a higher risk for: Sudden infant death syndrome  (SIDS). Ear infections. Lung infections. What actions can I take to prevent health problems? Quit smoking  Do not start smoking. Quit if you already smoke. Do not replace cigarette smoking with vaping devices, such as e-cigarettes. Make a plan to quit smoking and commit to it. Look for programs to help you, and ask your health care provider for recommendations and ideas. Set a date and write down all the reasons you want to quit. Let your friends and family know you are quitting so they can help and support you. Consider finding friends who also want to quit. It can be easier to quit with someone else, so that you can support each other. Talk with your health care provider about using nicotine replacement medicines to help you quit. These include gum, lozenges, patches, sprays, or pills. If you try to quit but return to smoking, stay positive. It is common to slip up when you first quit, so take it one day at a time. Be prepared for cravings. When you feel the urge to smoke, chew gum or suck on hard candy. Lifestyle Stay busy. Take care of your body. Get plenty of exercise, eat a healthy diet, and drink plenty of water. Find ways to manage your stress, such as meditation, yoga, exercise, or time spent with friends and family. Ask your health care provider about having regular tests (screenings) to check for cancer. This may include blood tests, imaging tests, and other tests. Where to find support To get support to quit smoking, consider: Asking your health care provider for more information and resources. Joining a support group for people who want to quit smoking in your local community. There are many effective programs  that may help you to quit. Calling the smokefree.gov counselor helpline at 1-800-QUIT-NOW 351-658-3741). Where to find more information You may find more information about quitting smoking from: Centers for Disease Control and Prevention: https://www.chang-huffman.com/ https://hall.com/:  smokefree.gov American Lung Association: freedomfromsmoking.org Contact a health care provider if: You have problems breathing. Your lips, nose, or fingers turn blue. You have chest pain. You are coughing up blood. You feel like you will faint. You have other health changes that cause you to worry. Summary Smoking tobacco can negatively affect your health, the health of those around you, your finances, and your social life. Do not start smoking. Quit if you already smoke. If you need help quitting, ask your health care provider. Consider joining a support group for people in your local community who want to quit smoking. There are many effective programs that may help you to quit. This information is not intended to replace advice given to you by your health care provider. Make sure you discuss any questions you have with your health care provider. Document Revised: 04/24/2021 Document Reviewed: 04/24/2021 Elsevier Patient Education  [place].

## 2022-04-01 ENCOUNTER — Encounter: Payer: Self-pay | Admitting: Radiology

## 2022-04-07 ENCOUNTER — Other Ambulatory Visit: Payer: Self-pay

## 2022-04-07 ENCOUNTER — Encounter (HOSPITAL_COMMUNITY): Payer: Self-pay | Admitting: *Deleted

## 2022-04-07 ENCOUNTER — Emergency Department (HOSPITAL_COMMUNITY)
Admission: EM | Admit: 2022-04-07 | Discharge: 2022-04-08 | Disposition: A | Discharge status: Home / Self Care | Payer: Medicaid Other | Attending: Emergency Medicine | Admitting: Emergency Medicine

## 2022-04-07 DIAGNOSIS — D696 Thrombocytopenia, unspecified: Secondary | ICD-10-CM | POA: Insufficient documentation

## 2022-04-07 DIAGNOSIS — E876 Hypokalemia: Secondary | ICD-10-CM | POA: Insufficient documentation

## 2022-04-07 DIAGNOSIS — T18108A Unspecified foreign body in esophagus causing other injury, initial encounter: Secondary | ICD-10-CM | POA: Diagnosis present

## 2022-04-07 DIAGNOSIS — J45909 Unspecified asthma, uncomplicated: Secondary | ICD-10-CM | POA: Insufficient documentation

## 2022-04-07 DIAGNOSIS — X58XXXA Exposure to other specified factors, initial encounter: Secondary | ICD-10-CM | POA: Diagnosis not present

## 2022-04-07 LAB — CBC
HCT: 37.6 % (ref 36.0–46.0)
Hemoglobin: 13.1 g/dL (ref 12.0–15.0)
MCH: 31 pg (ref 26.0–34.0)
MCHC: 34.8 g/dL (ref 30.0–36.0)
MCV: 89.1 fL (ref 80.0–100.0)
Platelets: 107 10*3/uL — ABNORMAL LOW (ref 150–400)
RBC: 4.22 MIL/uL (ref 3.87–5.11)
RDW: 14.7 % (ref 11.5–15.5)
WBC: 9.1 10*3/uL (ref 4.0–10.5)
nRBC: 0 % (ref 0.0–0.2)

## 2022-04-07 LAB — COMPREHENSIVE METABOLIC PANEL
ALT: 13 U/L (ref 0–44)
AST: 11 U/L — ABNORMAL LOW (ref 15–41)
Albumin: 4 g/dL (ref 3.5–5.0)
Alkaline Phosphatase: 65 U/L (ref 38–126)
Anion gap: 7 (ref 5–15)
BUN: 9 mg/dL (ref 6–20)
CO2: 24 mmol/L (ref 22–32)
Calcium: 8.8 mg/dL — ABNORMAL LOW (ref 8.9–10.3)
Chloride: 107 mmol/L (ref 98–111)
Creatinine, Ser: 0.52 mg/dL (ref 0.44–1.00)
GFR, Estimated: >60 mL/min (ref >60)
Glucose, Bld: 112 mg/dL — ABNORMAL HIGH (ref 70–99)
Potassium: 2.8 mmol/L — ABNORMAL LOW (ref 3.5–5.1)
Sodium: 138 mmol/L (ref 135–145)
Total Bilirubin: 0.4 mg/dL (ref 0.3–1.2)
Total Protein: 7 g/dL (ref 6.5–8.1)

## 2022-04-07 LAB — LIPASE, BLOOD: Lipase: 45 U/L (ref 11–51)

## 2022-04-07 NOTE — ED Triage Notes (Signed)
Pt with emesis x 1 today- felt like food stuck at her stomach, pt vomited x 1 and noted blood.  Felt left ear popped with vomiting earlier today and states she has clear substance draining from her left ear.

## 2022-04-08 LAB — URINALYSIS, ROUTINE W REFLEX MICROSCOPIC
Bilirubin Urine: NEGATIVE
Glucose, UA: NEGATIVE mg/dL
Ketones, ur: NEGATIVE mg/dL
Nitrite: NEGATIVE
Protein, ur: 30 mg/dL — AB
Specific Gravity, Urine: 1.015 (ref 1.005–1.030)
pH: 6 (ref 5.0–8.0)

## 2022-04-08 LAB — POC URINE PREG, ED: Preg Test, Ur: NEGATIVE

## 2022-04-08 LAB — MAGNESIUM: Magnesium: 1.8 mg/dL (ref 1.7–2.4)

## 2022-04-08 MED ORDER — POTASSIUM CHLORIDE CRYS ER 20 MEQ PO TBCR
20.0000 meq | EXTENDED_RELEASE_TABLET | Freq: Two times a day (BID) | ORAL | 0 refills | Status: DC
Start: 2022-04-08 — End: 2022-05-28

## 2022-04-08 MED ORDER — PANTOPRAZOLE SODIUM 40 MG PO TBEC: 40.0000 mg | DELAYED_RELEASE_TABLET | Freq: Every day | ORAL | 0 refills | Status: DC

## 2022-04-08 MED ORDER — MAGNESIUM SULFATE 2 GM/50ML IV SOLN
2.0000 g | Freq: Once | INTRAVENOUS | Status: AC
  Administered 2022-04-08: 2 g via INTRAVENOUS
  Filled 2022-04-08: qty 50

## 2022-04-08 MED ORDER — POTASSIUM CHLORIDE 10 MEQ/100ML IV SOLN
10.0000 meq | INTRAVENOUS | Status: AC
  Administered 2022-04-08 (×2): 10 meq via INTRAVENOUS
  Filled 2022-04-08 (×2): qty 100

## 2022-04-08 MED ORDER — PANTOPRAZOLE SODIUM 40 MG IV SOLR
40.0000 mg | Freq: Once | INTRAVENOUS | Status: AC
  Administered 2022-04-08: 40 mg via INTRAVENOUS
  Filled 2022-04-08: qty 10

## 2022-04-08 MED ORDER — GLUCAGON HCL RDNA (DIAGNOSTIC) 1 MG IJ SOLR
1.0000 mg | Freq: Once | INTRAMUSCULAR | Status: AC
  Administered 2022-04-08: 1 mg via INTRAVENOUS
  Filled 2022-04-08: qty 1

## 2022-04-08 MED ORDER — POTASSIUM CHLORIDE CRYS ER 20 MEQ PO TBCR
40.0000 meq | EXTENDED_RELEASE_TABLET | Freq: Once | ORAL | Status: AC
  Administered 2022-04-08: 40 meq via ORAL
  Filled 2022-04-08: qty 2

## 2022-04-08 NOTE — ED Provider Notes (Signed)
[facility] EMERGENCY DEPARTMENT Provider Note   CSN: [id number] Arrival date & time: 04/07/22  2248     History  Chief Complaint  Patient presents with   Emesis    Brittany Payne is a 31 y.o. female.  The history is provided by the patient.  Emesis She has history of asthma, bipolar disorder and comes in because some pork that she ate felt like it got stuck in her lower chest.  She vomited once and that sensation is gone but she now has some burning in her chest.  She also felt her left ear pop and there has been some drainage from the ear since then.  She is a smoker and uses NSAIDs and has history of peptic ulcer disease in the past.   Home Medications Prior to Admission medications   Medication Sig Start Date End Date Taking? Authorizing Provider  albuterol (VENTOLIN HFA) 108 (90 Base) MCG/ACT inhaler Inhale 2 puffs into the lungs every 4 (four) hours as needed for wheezing or shortness of breath.    [provider]  atorvastatin (LIPITOR) 10 MG tablet Take 1 tablet (10 mg total) by mouth every other day. Patient not taking: Reported on 08/23/2021 03/19/21 06/17/21  Pricilla Riffle, MD  cyclobenzaprine (FLEXERIL) 10 MG tablet Take 1 tablet (10 mg total) by mouth 2 (two) times daily as needed for muscle spasms. 01/09/22   Mesner, Barbara Cower, MD  HYDROcodone-acetaminophen (NORCO/VICODIN) 5-325 MG tablet One tablet every six hours for pain.  Limit 7 days. 03/26/22   Darreld Mclean, MD  ibuprofen (ADVIL) 800 MG tablet Take 1 tablet (800 mg total) by mouth every 8 (eight) hours as needed for moderate pain. 01/08/22   Bethann Berkshire, MD  ipratropium (ATROVENT) 0.03 % nasal spray Place 2 sprays into both nostrils 2 (two) times daily. 07/10/20   Bing Neighbors, FNP  ipratropium-albuterol (DUONEB) 0.5-2.5 (3) MG/3ML SOLN Take 3 mLs by nebulization every 6 (six) hours as needed. 03/08/21   Achille Rich, PA-C  loperamide (IMODIUM) 2 MG capsule Take 1 capsule (2 mg total) by mouth 4 (four)  times daily as needed for diarrhea or loose stools. 10/27/20   Burgess Amor, PA-C  metoprolol succinate (TOPROL XL) 25 MG 24 hr tablet Take 0.5 tablets (12.5 mg total) by mouth as directed. 03/19/21   Pricilla Riffle, MD  montelukast (SINGULAIR) 10 MG tablet Take 10 mg by mouth at bedtime.    [provider]  ondansetron (ZOFRAN) 4 MG tablet Take 1 tablet (4 mg total) by mouth every 6 (six) hours. 02/16/22   Small, Brooke L, PA  ondansetron (ZOFRAN-ODT) 4 MG disintegrating tablet 4mg  ODT q4 hours prn nausea/vomit 01/08/22   01/10/22, MD  ondansetron (ZOFRAN-ODT) 4 MG disintegrating tablet Take 1 tablet (4 mg total) by mouth every 8 (eight) hours as needed for vomiting. 4mg  ODT q4 hours prn nausea/vomit 01/09/22   Mesner, , MD  prochlorperazine (COMPAZINE) 10 MG tablet Take 1 tablet (10 mg total) by mouth every 6 (six) hours as needed for nausea or vomiting. 04/26/21   Barbara Cower, MD  promethazine-dextromethorphan (PROMETHAZINE-DM) 6.25-15 MG/5ML syrup Take 5 mLs by mouth 4 (four) times daily as needed for cough. 06/20/21   07-15-1990, MD  promethazine-dextromethorphan (PROMETHAZINE-DM) 6.25-15 MG/5ML syrup Take 5 mLs by mouth 4 (four) times daily as needed. 01/16/22   07-15-1990, PA-C  cetirizine (ZYRTEC) 10 MG tablet Take 1 tablet (10 mg total) by mouth daily. Patient not taking: Reported on  03/06/2020 09/07/19 03/16/20  Triplett, Tammy, PA-C      Allergies    Yellow jacket venom, Amoxicillin, Celexa [citalopram], Penicillins, and Vistaril [hydroxyzine hcl]    Review of Systems   Review of Systems  Gastrointestinal:  Positive for vomiting.  All other systems reviewed and are negative.   Physical Exam Updated Vital Signs BP (!) 129/97 (BP Location: Right Arm)   Pulse (!) 117   Temp 98.2 F (36.8 C) (Oral)   Resp 20   Ht 5\' 1"  (1.549 m)   Wt 66.2 kg   LMP 04/03/2022   SpO2 100%   BMI 27.59 kg/m  Physical Exam Vitals and nursing note reviewed.  31 year old  female, resting comfortably and in no acute distress. Vital signs are significant for elevated blood pressure and heart rate. Oxygen saturation is 100%, which is normal. Head is normocephalic and atraumatic. PERRLA, EOMI. Oropharynx is clear.  Tympanic membranes are clear. Neck is nontender and supple without adenopathy or JVD. Back is nontender and there is no CVA tenderness. Lungs are clear without rales, wheezes, or rhonchi. Chest is nontender. Heart has regular rate and rhythm without murmur. Abdomen is soft, flat, nontender. Extremities have no cyanosis or edema, full range of motion is present. Skin is warm and dry without rash. Neurologic: Mental status is normal, cranial nerves are intact, there are no motor or sensory deficits.  ED Results / Procedures / Treatments   Labs (all labs ordered are listed, but only abnormal results are displayed) Labs Reviewed  COMPREHENSIVE METABOLIC PANEL - Abnormal; Notable for the following components:      Result Value   Potassium 2.8 (*)    Glucose, Bld 112 (*)    Calcium 8.8 (*)    AST 11 (*)    All other components within normal limits  CBC - Abnormal; Notable for the following components:   Platelets 107 (*)    All other components within normal limits  URINALYSIS, ROUTINE W REFLEX MICROSCOPIC - Abnormal; Notable for the following components:   APPearance HAZY (*)    Hgb urine dipstick LARGE (*)    Protein, ur 30 (*)    Leukocytes,Ua SMALL (*)    Bacteria, UA RARE (*)    All other components within normal limits  LIPASE, BLOOD  MAGNESIUM  POC URINE PREG, ED   Procedures Procedures    Medications Ordered in ED Medications  magnesium sulfate IVPB 2 g 50 mL (2 g Intravenous New Bag/Given 04/08/22 0332)  potassium chloride 10 mEq in 100 mL IVPB (10 mEq Intravenous New Bag/Given 04/08/22 0222)  glucagon (human recombinant) (GLUCAGEN) injection 1 mg (1 mg Intravenous Given 04/08/22 0112)  potassium chloride SA (KLOR-CON M) CR  tablet 40 mEq (40 mEq Oral Given 04/08/22 0333)  pantoprazole (PROTONIX) injection 40 mg (40 mg Intravenous Given 04/08/22 04/10/22)    ED Course/ Medical Decision Making/ A&P                           Medical Decision Making Amount and/or Complexity of Data Reviewed Labs: ordered.  Risk Prescription drug management.   Probable esophageal food impaction which seems like it has spontaneously resolved following emesis.  I have ordered an oral fluid challenge.  Following oral fluids, she has not vomited but states that it still feels funny in her epigastric area.  I have reviewed and interpreted her laboratory test, and my interpretation is severe hypokalemia with potassium 2.8, mild elevation  of random glucose, mild thrombocytopenia.  Thrombocytopenia has been present previously, and mild hypokalemia had been present previously.  I have ordered a dose of glucagon as well as some intravenous potassium and a magnesium level.  Following glucagon, patient feels like she is back to normal.  Magnesium level has come back normal, but at the low end of normal.  I have ordered a dose of intravenous magnesium and a dose of pantoprazole.  I am discharging her with prescriptions for K-Dur and pantoprazole with instructions to follow-up with her PCP regarding her potassium, and with gastroenterology regarding her episode of esophageal obstruction and concern for possible esophageal stricture.  Final Clinical Impression(s) / ED Diagnoses Final diagnoses:  Impacted esophageal foreign body, initial encounter  Hypokalemia  Thrombocytopenia (HCC)    Rx / DC Orders ED Discharge Orders          Ordered    pantoprazole (PROTONIX) 40 MG tablet  Daily        04/08/22 0231    potassium chloride SA (KLOR-CON M) 20 MEQ tablet  2 times daily        04/08/22 0231              Dione Booze, MD 04/08/22 [id number]

## 2022-04-08 NOTE — Discharge Instructions (Addendum)
When you are eating meat, make sure to take small bites and chew it very thoroughly before swallowing.  Your esophagus is likely scarred from acid reflux.  I am giving you a prescription for pantoprazole which will block stomach acid.  You will also need to follow-up with the gastroenterologist.  Your potassium is very low, I am giving you a prescription for potassium to take for the next 2 weeks.  Smoking makes stomach acid problems worse.  If you can stop smoking, it will be very helpful for you.

## 2022-04-09 ENCOUNTER — Telehealth: Payer: Self-pay

## 2022-04-09 MED ORDER — HYDROCODONE-ACETAMINOPHEN 5-325 MG PO TABS: ORAL_TABLET | ORAL | 0 refills | Status: DC

## 2022-04-09 NOTE — Telephone Encounter (Signed)
Filled

## 2022-04-11 ENCOUNTER — Other Ambulatory Visit: Payer: Self-pay

## 2022-04-11 ENCOUNTER — Emergency Department (HOSPITAL_COMMUNITY)
Admission: EM | Admit: 2022-04-11 | Discharge: 2022-04-11 | Disposition: A | Discharge status: Home / Self Care | Payer: Medicaid Other | Attending: Emergency Medicine | Admitting: Emergency Medicine

## 2022-04-11 ENCOUNTER — Encounter (HOSPITAL_COMMUNITY): Payer: Self-pay | Admitting: Emergency Medicine

## 2022-04-11 DIAGNOSIS — R112 Nausea with vomiting, unspecified: Secondary | ICD-10-CM | POA: Insufficient documentation

## 2022-04-11 DIAGNOSIS — R1012 Left upper quadrant pain: Secondary | ICD-10-CM | POA: Insufficient documentation

## 2022-04-11 DIAGNOSIS — R197 Diarrhea, unspecified: Secondary | ICD-10-CM | POA: Diagnosis not present

## 2022-04-11 DIAGNOSIS — Z1152 Encounter for screening for COVID-19: Secondary | ICD-10-CM | POA: Diagnosis not present

## 2022-04-11 HISTORY — DX: Other psychoactive substance abuse, uncomplicated: F19.10

## 2022-04-11 LAB — CBC WITH DIFFERENTIAL/PLATELET
Abs Immature Granulocytes: 0.03 10*3/uL (ref 0.00–0.07)
Basophils Absolute: 0 10*3/uL (ref 0.0–0.1)
Basophils Relative: 0 %
Eosinophils Absolute: 0.1 10*3/uL (ref 0.0–0.5)
Eosinophils Relative: 1 %
HCT: 46.8 % — ABNORMAL HIGH (ref 36.0–46.0)
Hemoglobin: 15.7 g/dL — ABNORMAL HIGH (ref 12.0–15.0)
Immature Granulocytes: 0 %
Lymphocytes Relative: 4 %
Lymphs Abs: 0.4 10*3/uL — ABNORMAL LOW (ref 0.7–4.0)
MCH: 30 pg (ref 26.0–34.0)
MCHC: 33.5 g/dL (ref 30.0–36.0)
MCV: 89.5 fL (ref 80.0–100.0)
Monocytes Absolute: 0.4 10*3/uL (ref 0.1–1.0)
Monocytes Relative: 4 %
Neutro Abs: 9.8 10*3/uL — ABNORMAL HIGH (ref 1.7–7.7)
Neutrophils Relative %: 91 %
Platelets: 141 10*3/uL — ABNORMAL LOW (ref 150–400)
RBC: 5.23 MIL/uL — ABNORMAL HIGH (ref 3.87–5.11)
RDW: 14.9 % (ref 11.5–15.5)
WBC: 10.7 10*3/uL — ABNORMAL HIGH (ref 4.0–10.5)
nRBC: 0 % (ref 0.0–0.2)

## 2022-04-11 LAB — COMPREHENSIVE METABOLIC PANEL
ALT: 13 U/L (ref 0–44)
AST: 13 U/L — ABNORMAL LOW (ref 15–41)
Albumin: 4.8 g/dL (ref 3.5–5.0)
Alkaline Phosphatase: 77 U/L (ref 38–126)
Anion gap: 12 (ref 5–15)
BUN: 8 mg/dL (ref 6–20)
CO2: 25 mmol/L (ref 22–32)
Calcium: 9 mg/dL (ref 8.9–10.3)
Chloride: 102 mmol/L (ref 98–111)
Creatinine, Ser: 0.63 mg/dL (ref 0.44–1.00)
GFR, Estimated: >60 mL/min (ref >60)
Glucose, Bld: 122 mg/dL — ABNORMAL HIGH (ref 70–99)
Potassium: 3.3 mmol/L — ABNORMAL LOW (ref 3.5–5.1)
Sodium: 139 mmol/L (ref 135–145)
Total Bilirubin: 1 mg/dL (ref 0.3–1.2)
Total Protein: 8.7 g/dL — ABNORMAL HIGH (ref 6.5–8.1)

## 2022-04-11 LAB — URINALYSIS, ROUTINE W REFLEX MICROSCOPIC
Bacteria, UA: NONE SEEN
Bilirubin Urine: NEGATIVE
Glucose, UA: NEGATIVE mg/dL
Hgb urine dipstick: NEGATIVE
Ketones, ur: 5 mg/dL — AB
Nitrite: NEGATIVE
Protein, ur: 100 mg/dL — AB
Specific Gravity, Urine: 1.021 (ref 1.005–1.030)
pH: 5 (ref 5.0–8.0)

## 2022-04-11 LAB — LIPASE, BLOOD: Lipase: 30 U/L (ref 11–51)

## 2022-04-11 LAB — RESP PANEL BY RT-PCR (FLU A&B, COVID) ARPGX2
Influenza A by PCR: NEGATIVE
Influenza B by PCR: NEGATIVE
SARS Coronavirus 2 by RT PCR: NEGATIVE

## 2022-04-11 LAB — RAPID URINE DRUG SCREEN, HOSP PERFORMED
Amphetamines: NOT DETECTED
Barbiturates: NOT DETECTED
Benzodiazepines: NOT DETECTED
Cocaine: POSITIVE — AB
Opiates: POSITIVE — AB
Tetrahydrocannabinol: POSITIVE — AB

## 2022-04-11 MED ORDER — ONDANSETRON 4 MG PO TBDP: ORAL_TABLET | ORAL | 0 refills | Status: DC

## 2022-04-11 MED ORDER — SODIUM CHLORIDE 0.9 % IV BOLUS
1000.0000 mL | Freq: Once | INTRAVENOUS | Status: AC
  Administered 2022-04-11: 1000 mL via INTRAVENOUS

## 2022-04-11 MED ORDER — METOCLOPRAMIDE HCL 5 MG/ML IJ SOLN
10.0000 mg | Freq: Once | INTRAMUSCULAR | Status: AC
  Administered 2022-04-11: 10 mg via INTRAVENOUS
  Filled 2022-04-11: qty 2

## 2022-04-11 MED ORDER — ONDANSETRON HCL 4 MG/2ML IJ SOLN
4.0000 mg | Freq: Once | INTRAMUSCULAR | Status: AC
  Administered 2022-04-11: 4 mg via INTRAVENOUS
  Filled 2022-04-11: qty 2

## 2022-04-11 NOTE — Discharge Instructions (Signed)
Use Imodium, which is over-the-counter, as needed for diarrhea.  Use the Zofran that has been prescribed as needed for nausea and vomiting.  Stay hydrated.

## 2022-04-11 NOTE — ED Provider Notes (Signed)
[facility] EMERGENCY DEPARTMENT Provider Note   CSN: [id number] Arrival date & time: 04/11/22  0116     History  Chief Complaint  Patient presents with   Emesis   Abdominal Pain    Brittany Payne is a 31 y.o. female.  Patient presents to the emergency department for evaluation of nausea, vomiting and diarrhea.  Patient reports that she was seen 3 days ago after she got food stuck in her esophagus.  She has "never been right" since then.  Patient reports multiple episodes of vomiting today, cannot hold anything down.  She has had watery, nonbloody diarrhea as well.       Home Medications Prior to Admission medications   Medication Sig Start Date End Date Taking? Authorizing Provider  ondansetron (ZOFRAN-ODT) 4 MG disintegrating tablet 4mg  ODT q4 hours prn nausea/vomit 04/11/22  Yes Martine Bleecker, 04/13/22, MD  albuterol (VENTOLIN HFA) 108 (90 Base) MCG/ACT inhaler Inhale 2 puffs into the lungs every 4 (four) hours as needed for wheezing or shortness of breath.    [provider]  atorvastatin (LIPITOR) 10 MG tablet Take 1 tablet (10 mg total) by mouth every other day. Patient not taking: Reported on 08/23/2021 03/19/21 06/17/21  08/15/21, MD  cyclobenzaprine (FLEXERIL) 10 MG tablet Take 1 tablet (10 mg total) by mouth 2 (two) times daily as needed for muscle spasms. 01/09/22   Mesner, 01/11/22, MD  HYDROcodone-acetaminophen (NORCO/VICODIN) 5-325 MG tablet One tablet every six hours for pain.  Limit 7 days. 04/09/22   04/11/22, MD  ibuprofen (ADVIL) 800 MG tablet Take 1 tablet (800 mg total) by mouth every 8 (eight) hours as needed for moderate pain. 01/08/22   01/10/22, MD  ipratropium (ATROVENT) 0.03 % nasal spray Place 2 sprays into both nostrils 2 (two) times daily. 07/10/20   07/12/20, FNP  ipratropium-albuterol (DUONEB) 0.5-2.5 (3) MG/3ML SOLN Take 3 mLs by nebulization every 6 (six) hours as needed. 03/08/21   03/10/21, PA-C  loperamide  (IMODIUM) 2 MG capsule Take 1 capsule (2 mg total) by mouth 4 (four) times daily as needed for diarrhea or loose stools. 10/27/20   10/29/20, PA-C  metoprolol succinate (TOPROL XL) 25 MG 24 hr tablet Take 0.5 tablets (12.5 mg total) by mouth as directed. 03/19/21   13/7/22, MD  montelukast (SINGULAIR) 10 MG tablet Take 10 mg by mouth at bedtime.    [provider]  ondansetron (ZOFRAN) 4 MG tablet Take 1 tablet (4 mg total) by mouth every 6 (six) hours. 02/16/22   Small, Brooke L, PA  pantoprazole (PROTONIX) 40 MG tablet Take 1 tablet (40 mg total) by mouth daily. 04/08/22   04/10/22, MD  potassium chloride SA (KLOR-CON M) 20 MEQ tablet Take 1 tablet (20 mEq total) by mouth 2 (two) times daily. 04/08/22   04/10/22, MD  prochlorperazine (COMPAZINE) 10 MG tablet Take 1 tablet (10 mg total) by mouth every 6 (six) hours as needed for nausea or vomiting. 04/26/21   04/28/21, MD  promethazine-dextromethorphan (PROMETHAZINE-DM) 6.25-15 MG/5ML syrup Take 5 mLs by mouth 4 (four) times daily as needed for cough. 06/20/21   08/18/21, MD  promethazine-dextromethorphan (PROMETHAZINE-DM) 6.25-15 MG/5ML syrup Take 5 mLs by mouth 4 (four) times daily as needed. 01/16/22   03/18/22, PA-C  cetirizine (ZYRTEC) 10 MG tablet Take 1 tablet (10 mg total) by mouth daily. Patient not taking: Reported on 03/06/2020 09/07/19 03/16/20  13/4/21, PA-C  Allergies    Yellow jacket venom, Amoxicillin, Celexa [citalopram], Penicillins, and Vistaril [hydroxyzine hcl]    Review of Systems   Review of Systems  Physical Exam Updated Vital Signs BP 116/73   Pulse 83   Temp 98.1 F (36.7 C) (Oral)   Resp 20   Ht 5\' 1"  (1.549 m)   Wt 64.9 kg   LMP 04/03/2022   SpO2 98%   BMI 27.02 kg/m  Physical Exam Vitals and nursing note reviewed.  Constitutional:      General: She is not in acute distress.    Appearance: She is well-developed.  HENT:     Head: Normocephalic and  atraumatic.     Mouth/Throat:     Mouth: Mucous membranes are moist.  Eyes:     General: Vision grossly intact. Gaze aligned appropriately.     Extraocular Movements: Extraocular movements intact.     Conjunctiva/sclera: Conjunctivae normal.  Cardiovascular:     Rate and Rhythm: Normal rate and regular rhythm.     Pulses: Normal pulses.     Heart sounds: Normal heart sounds, S1 normal and S2 normal. No murmur heard.    No friction rub. No gallop.  Pulmonary:     Effort: Pulmonary effort is normal. No respiratory distress.     Breath sounds: Normal breath sounds.  Abdominal:     General: Bowel sounds are normal.     Palpations: Abdomen is soft.     Tenderness: There is abdominal tenderness in the left upper quadrant. There is no guarding or rebound.     Hernia: No hernia is present.  Musculoskeletal:        General: No swelling.     Cervical back: Full passive range of motion without pain, normal range of motion and neck supple. No spinous process tenderness or muscular tenderness. Normal range of motion.     Right lower leg: No edema.     Left lower leg: No edema.  Skin:    General: Skin is warm and dry.     Capillary Refill: Capillary refill takes less than 2 seconds.     Findings: No ecchymosis, erythema, rash or wound.  Neurological:     General: No focal deficit present.     Mental Status: She is alert and oriented to person, place, and time.     GCS: GCS eye subscore is 4. GCS verbal subscore is 5. GCS motor subscore is 6.     Cranial Nerves: Cranial nerves 2-12 are intact.     Sensory: Sensation is intact.     Motor: Motor function is intact.     Coordination: Coordination is intact.  Psychiatric:        Attention and Perception: Attention normal.        Mood and Affect: Mood normal.        Speech: Speech normal.        Behavior: Behavior normal.     ED Results / Procedures / Treatments   Labs (all labs ordered are listed, but only abnormal results are  displayed) Labs Reviewed  CBC WITH DIFFERENTIAL/PLATELET - Abnormal; Notable for the following components:      Result Value   WBC 10.7 (*)    RBC 5.23 (*)    Hemoglobin 15.7 (*)    HCT 46.8 (*)    Platelets 141 (*)    Neutro Abs 9.8 (*)    Lymphs Abs 0.4 (*)    All other components within normal limits  COMPREHENSIVE METABOLIC  PANEL - Abnormal; Notable for the following components:   Potassium 3.3 (*)    Glucose, Bld 122 (*)    Total Protein 8.7 (*)    AST 13 (*)    All other components within normal limits  URINALYSIS, ROUTINE W REFLEX MICROSCOPIC - Abnormal; Notable for the following components:   Color, Urine AMBER (*)    APPearance HAZY (*)    Ketones, ur 5 (*)    Protein, ur 100 (*)    Leukocytes,Ua TRACE (*)    All other components within normal limits  RAPID URINE DRUG SCREEN, HOSP PERFORMED - Abnormal; Notable for the following components:   Opiates POSITIVE (*)    Cocaine POSITIVE (*)    Tetrahydrocannabinol POSITIVE (*)    All other components within normal limits  RESP PANEL BY RT-PCR (FLU A&B, COVID) ARPGX2  LIPASE, BLOOD    EKG None  Radiology No results found.  Procedures Procedures    Medications Ordered in ED Medications  sodium chloride 0.9 % bolus 1,000 mL (0 mLs Intravenous Stopped 04/11/22 0345)  ondansetron (ZOFRAN) injection 4 mg (4 mg Intravenous Given 04/11/22 0144)  metoCLOPramide (REGLAN) injection 10 mg (10 mg Intravenous Given 04/11/22 0144)    ED Course/ Medical Decision Making/ A&P                           Medical Decision Making Amount and/or Complexity of Data Reviewed External Data Reviewed: labs and notes. Labs: ordered. Decision-making details documented in ED Course.  Risk Prescription drug management.   Presents to the emergency department with nausea vomiting and diarrhea.  Patient indicates that she was in the ED several days ago.  Notes from that visit were reviewed, it seems that she had some element of  esophageal impaction that spontaneously resolved.  It is unclear if this is related.  She is now experiencing watery diarrhea with persistent vomiting.  No signs of obstruction.  Patient's lab work was unremarkable.  Vital signs are unremarkable.  Patient administered IV fluids and antiemetics and has done well, no further nausea or vomiting.  Urine drug screen positive for multiple agents, likely contributory.  Patient stabilized, doing well, appropriate for discharge.  Continued symptomatic treatment.        Final Clinical Impression(s) / ED Diagnoses Final diagnoses:  Nausea vomiting and diarrhea    Rx / DC Orders ED Discharge Orders          Ordered    ondansetron (ZOFRAN-ODT) 4 MG disintegrating tablet        04/11/22 0420              Gilda Crease, MD 04/11/22 [phone]

## 2022-04-11 NOTE — ED Notes (Signed)
Pt ambulated to restroom with steady gait. Nitin Mckowen Demaris Leavell,RN 

## 2022-04-11 NOTE — ED Triage Notes (Addendum)
Pt c/o generalized abdominal pain x 2-3 days that  is sharp in character. She reports emesis x 10 in last 24 hours and multiple episodes of watery diarrhea. She reports being seen a few days ago for hematemesis. Zofran taken 2 hours ago. Wardell Heath Gerrianne Scale

## 2022-04-11 NOTE — ED Notes (Signed)
Pt alert, oriented, and ambulatory upon DC. She calls for a ride and waits in lobby. She is aware that she has Zofran at the pharmacy to pick  up and to take Imodium for diarrhea. Wardell Heath Gerrianne Scale

## 2022-04-12 ENCOUNTER — Ambulatory Visit (HOSPITAL_COMMUNITY)
Admission: RE | Admit: 2022-04-12 | Discharge: 2022-04-12 | Disposition: A | Discharge status: Home / Self Care | Payer: Medicaid Other | Source: Ambulatory Visit | Attending: Orthopaedic Surgery | Admitting: Orthopaedic Surgery

## 2022-04-12 DIAGNOSIS — G8929 Other chronic pain: Secondary | ICD-10-CM | POA: Diagnosis present

## 2022-04-12 DIAGNOSIS — M25561 Pain in right knee: Secondary | ICD-10-CM | POA: Diagnosis not present

## 2022-04-16 ENCOUNTER — Ambulatory Visit: Payer: Medicaid Other | Admitting: Orthopaedic Surgery

## 2022-04-23 ENCOUNTER — Ambulatory Visit: Payer: Medicaid Other | Admitting: Orthopaedic Surgery

## 2022-04-24 ENCOUNTER — Encounter: Payer: Self-pay | Admitting: Orthopaedic Surgery

## 2022-04-24 ENCOUNTER — Ambulatory Visit (INDEPENDENT_AMBULATORY_CARE_PROVIDER_SITE_OTHER): Payer: Medicaid Other | Admitting: Orthopaedic Surgery

## 2022-04-24 ENCOUNTER — Telehealth: Payer: Self-pay

## 2022-04-24 VITALS — BP 120/90 | HR 88

## 2022-04-24 DIAGNOSIS — M25561 Pain in right knee: Secondary | ICD-10-CM

## 2022-04-24 DIAGNOSIS — G8929 Other chronic pain: Secondary | ICD-10-CM

## 2022-04-24 MED ORDER — NAPROXEN 500 MG PO TABS: 500.0000 mg | ORAL_TABLET | Freq: Two times a day (BID) | ORAL | 5 refills | Status: DC

## 2022-04-24 NOTE — Patient Instructions (Addendum)
WE WILL GET YOU SCHEDULED TO SEE DR.HARRISON TO DISCUSS SURGERY FOR YOUR TORN MENISCUS  DR.HARRISON IS AWESOME!!!! HE HAS BEEN DOING THIS FOR YEARS SO YOU ARE IN GOOD HANDS!!!!

## 2022-04-24 NOTE — Progress Notes (Signed)
My knee still hurts.  She had MRI done of the right knee.  It showed: IMPRESSION: 1. Oblique tear of the peripheral aspect of the body of the lateral meniscus extend to the inferior articular surface.  I have explained the findings to her.  I have shown her a model of the knee.  I will have Dr. Romeo Apple see her about arthroscopy of the knee.  She is agreeable to this.  I have independently reviewed the MRI.    Right knee has pain laterally, small effusion, crepitus, limp right, ROM 0 to 110, positive McMurray.  NV intact.  No distal edema.  Encounter Diagnosis  Name Primary?   Chronic pain of right knee Yes   To see Dr. Romeo Apple.  Call if any problem.  Precautions discussed.  Electronically Signed Darreld Mclean, MD 12/13/20238:22 AM;

## 2022-04-24 NOTE — Addendum Note (Signed)
Addended by: Earnstine Regal on: 04/24/2022 08:26 AM   Modules accepted: Orders

## 2022-04-25 MED ORDER — HYDROCODONE-ACETAMINOPHEN 5-325 MG PO TABS: ORAL_TABLET | ORAL | 0 refills | Status: DC

## 2022-04-29 ENCOUNTER — Ambulatory Visit (INDEPENDENT_AMBULATORY_CARE_PROVIDER_SITE_OTHER): Payer: Medicaid Other | Admitting: Orthopedic Surgery

## 2022-04-29 ENCOUNTER — Encounter: Payer: Self-pay | Admitting: Orthopedic Surgery

## 2022-04-29 VITALS — BP 142/90 | HR 88 | Ht 61.0 in | Wt 136.0 lb

## 2022-04-29 DIAGNOSIS — M23300 Other meniscus derangements, unspecified lateral meniscus, right knee: Secondary | ICD-10-CM | POA: Diagnosis not present

## 2022-04-29 NOTE — Addendum Note (Signed)
Addended byCaffie Damme on: 04/29/2022 03:22 PM   Modules accepted: Orders

## 2022-04-29 NOTE — Progress Notes (Addendum)
Chief Complaint  Patient presents with   surgery consultation    RT knee/ torn meniscus// referred by Dr.Keeling    HPI:   Dr. [name] saw the patient referred her to me for possible meniscus surgery  31 year old female history of knee popping in and out over her lifetime but a few months back she walked into work the knee buckled and popped but she could not pop it back in place eventually workup included an MRI which showed a torn lateral meniscus.  I reviewed the MRI scan images with radiologist who agrees that there is a lateral meniscus tear is peripheral  Her popping seems to be coming from the patellofemoral joint at about 20 degrees of flexion  She would like to proceed with surgical intervention  Past Medical History:  Diagnosis Date   Asthma    Bipolar 1 disorder (HCC)    Depression    Substance abuse (HCC)     BP (!) 142/90   Pulse 88   Ht 5\' 1"  (1.549 m)   Wt 136 lb (61.7 kg)   LMP 04/03/2022   SpO2 99%   BMI 25.70 kg/m    General appearance: Well-developed well-nourished no gross deformities  Cardiovascular normal pulse and perfusion normal color without edema  Neurologically no sensation loss or deficits or pathologic reflexes  Psychological: Awake alert and oriented x3 mood and affect normal  Skin no lacerations or ulcerations no nodularity no palpable masses, no erythema or nodularity  Musculoskeletal: Examination of the right knee reveals tenderness along the lateral joint line but she also has peripatellar tenderness and the clicking and popping occurs as you go from flexion to extension at about 20 degrees.  She has peripatellar tenderness as well.  She also has stable knee in terms of her ligaments and pain when we extend the knee fully  Imaging MRI again was reviewed and the radiologist help with the review.  She has a lateral meniscal tear its peripheral its in the body of the meniscus  A/P  Torn lateral meniscus right knee Popping right  knee  The procedure has been fully reviewed with the patient; The risks and benefits of surgery have been discussed and explained and understood. Alternative treatment has also been reviewed, questions were encouraged and answered. The postoperative plan is also been reviewed. \    RE: Surgery Received: Today 04/05/2022, MD  Doreatha Massed, MD Last platelet count on 04/11/2022 was 141.  With that platelet count, she is unlikely to have any problems.  You may proceed with surgery.       Previous Messages    ----- Message ----- From: 04/13/2022, MD Sent: 04/29/2022   3:11 PM EST To: 05/01/2022, MD Subject: Surgery                                        Brittany Payne needs an arthroscopy and a partial meniscectomy I noted that she had some issues with her spleen  Can we proceed with the surgery or do we need to have platelets available

## 2022-04-29 NOTE — Patient Instructions (Addendum)
  Dr Ellin Saba will have to clear you for surgery, Dr Romeo Apple will send correspondence to him.  Your surgery will be at [facility] by Dr Romeo Apple  The hospital will contact you with a preoperative appointment to discuss Anesthesia.  Please arrive on time or 15 minutes early for the preoperative appointment, they have a very tight schedule if you are late or do not come in your surgery will be cancelled.  The phone number is [phone]. Please bring your medications with you for the appointment. They will tell you the arrival time and medication instructions when you have your preoperative evaluation. Do not wear nail polish the day of your surgery and if you take Phentermine you need to stop this medication ONE WEEK prior to your surgery. If you take Docia Barrier, Jardiance, or Steglatro) - Hold 72 hours before the procedure.  If you take Ozempic,  Bydureon or Trulicity do not take for 8 days before your surgery. If you take Victoza, Rybelsis, Saxenda or Adlyxi stop 24 hours before the procedure.  Please arrive at the hospital 2 hours before procedure if scheduled at 9:30 or later in the day or at the time the nurse tells you at your preoperative visit.   If you have my chart do not use the time given in my chart use the time given to you by the nurse during your preoperative visit.   Your surgery  time may change. Please be available for phone calls the day of your surgery and the day before. The Short Stay department may need to discuss changes about your surgery time. Not reaching the you could lead to procedure delays and possible cancellation.  You must have a ride home and someone to stay with you for 24 to 48 hours. The person taking you home will receive and sign for the your discharge instructions.  Please be prepared to give your support person's name and telephone number to [facility] [employer]. Dr Romeo Apple will need that name and phone number post procedure.

## 2022-04-29 NOTE — Addendum Note (Signed)
Addended byCaffie Damme on: 04/29/2022 03:23 PM   Modules accepted: Orders

## 2022-04-30 ENCOUNTER — Telehealth: Payer: Self-pay | Admitting: Radiology

## 2022-04-30 NOTE — Telephone Encounter (Signed)
-----   Message from Vickki Hearing, MD sent at 04/29/2022  4:52 PM EST ----- Regarding: FW: Surgery We re good on surgery !!  ----- Message ----- From: Doreatha Massed, MD Sent: 04/29/2022   4:51 PM EST To: Vickki Hearing, MD Subject: RE: Surgery                                    Last platelet count on 04/11/2022 was 141.  With that platelet count, she is unlikely to have any problems.  You may proceed with surgery. ----- Message ----- From: Vickki Hearing, MD Sent: 04/29/2022   3:11 PM EST To: Doreatha Massed, MD Subject: Surgery                                        Brittany Payne needs an arthroscopy and a partial meniscectomy I noted that she had some issues with her spleen  Can we proceed with the surgery or do we need to have platelets available

## 2022-04-30 NOTE — Telephone Encounter (Signed)
Great I will let Eber Jones know

## 2022-04-30 NOTE — Telephone Encounter (Signed)
I have forwarded staff message to Eber Jones and documented in her chart now as a phone message, surgery is sch for January

## 2022-05-03 ENCOUNTER — Other Ambulatory Visit: Payer: Self-pay | Admitting: Orthopedic Surgery

## 2022-05-03 NOTE — Telephone Encounter (Signed)
Patient called, requesting a refill on her Hydrocodone 5-325, 20 quantity, one every six hours to be called to [facility] in [place].

## 2022-05-03 NOTE — Addendum Note (Signed)
Addended byCaffie Damme on: 05/03/2022 10:31 AM   Modules accepted: Orders

## 2022-05-08 ENCOUNTER — Other Ambulatory Visit: Payer: Self-pay

## 2022-05-08 MED ORDER — HYDROCODONE-ACETAMINOPHEN 5-325 MG PO TABS: ORAL_TABLET | ORAL | 0 refills | Status: DC

## 2022-05-09 NOTE — Telephone Encounter (Signed)
done

## 2022-05-22 ENCOUNTER — Other Ambulatory Visit: Payer: Self-pay | Admitting: Orthopedic Surgery

## 2022-05-22 DIAGNOSIS — G894 Chronic pain syndrome: Secondary | ICD-10-CM

## 2022-05-22 MED ORDER — GABAPENTIN 100 MG PO CAPS: 100.0000 mg | ORAL_CAPSULE | Freq: Three times a day (TID) | ORAL | 2 refills | Status: DC

## 2022-05-22 MED ORDER — METHOCARBAMOL 500 MG PO TABS: 500.0000 mg | ORAL_TABLET | Freq: Three times a day (TID) | ORAL | 1 refills | Status: DC

## 2022-05-22 MED ORDER — PREDNISONE 10 MG (48) PO TBPK: ORAL_TABLET | Freq: Every day | ORAL | 0 refills | Status: DC

## 2022-05-22 NOTE — Patient Instructions (Signed)
Brittany Payne  05/22/2022     @PREFPERIOPPHARMACY @   Your procedure is scheduled on  05/28/2022.   Report to 05/30/2022 at  0830  A.M.   Call this number if you have problems the morning of surgery:  [phone]  If you experience any cold or flu symptoms such as cough, fever, chills, shortness of breath, etc. between now and your scheduled surgery, please notify [phone] at the above number.   Remember:  Do not eat or drink after midnight.         Use your inhaler before you come and bring your rescue inhaler with you.     Take these medicines the morning of surgery with A SIP OF WATER                    hydrocodone(if needed), pantoprazole.     Do not wear jewelry, make-up or nail polish.  Do not wear lotions, powders, or perfumes, or deodorant.  Do not shave 48 hours prior to surgery.  Men may shave face and neck.  Do not bring valuables to the hospital.  [facility] is not responsible for any belongings or valuables.  Contacts, dentures or bridgework may not be worn into surgery.  Leave your suitcase in the car.  After surgery it may be brought to your room.  For patients admitted to the hospital, discharge time will be determined by your treatment team.  Patients discharged the day of surgery will not be allowed to drive home and must have someone with them for 24 hours.    Special instructions:   DO NOT smoke tobacco or vape for 24 hours before your procedure.  Please read over the following fact sheets that you were given. Pain Booklet, Coughing and Deep Breathing, Surgical Site Infection Prevention, Anesthesia Post-op Instructions, and Care and Recovery After Surgery      Arthroscopic Knee Ligament Repair, Care After This sheet gives you information about how to care for yourself after your procedure. Your health care provider may also give you more specific instructions. If you have problems or questions, contact your health care provider. What can  I expect after the procedure? After the procedure, it is common to have: Soreness or pain in your knee. Bruising and swelling on your knee, calf, and ankle for 3-4 days. A small amount of fluid coming from the incisions. Follow these instructions at home: Medicines Take over-the-counter and prescription medicines only as told by your health care provider. Ask your health care provider if the medicine prescribed to you: Requires you to avoid driving or using machinery. Can cause constipation. You may need to take these actions to prevent or treat constipation: Drink enough fluid to keep your urine pale yellow. Take over-the-counter or prescription medicines. Eat foods that are high in fiber, such as beans, whole grains, and fresh fruits and vegetables. Limit foods that are high in fat and processed sugars, such as fried or sweet foods. If you have a brace or immobilizer: Wear it as told by your health care provider. Remove it only as told by your health care provider. Loosen it if your toes tingle, become numb, or turn cold and blue. Keep it clean and dry. Ask your health care provider when it is safe to drive. Bathing Do not take baths, swim, or use a hot tub until your health care provider approves. Keep your bandage (dressing) dry until your health care provider  says that it can be removed. If the brace or immobilizer is not waterproof: Do not let it get wet. Cover it with a watertight covering when you take a bath or shower. Incision care  Follow instructions from your health care provider about how to take care of your incisions. Make sure you: Wash your hands with soap and water for at least 20 seconds before and after you change your dressing. If soap and water are not available, use hand sanitizer. Change your dressing as told by your health care provider. Leave stitches (sutures), skin glue, or adhesive strips in place. These skin closures may need to stay in place for 2 weeks  or longer. If adhesive strip edges start to loosen and curl up, you may trim the loose edges. Do not remove adhesive strips completely unless your health care provider tells you to do that. Check your incision areas every day for signs of infection. Check for: Redness. More swelling or pain. Blood or more fluid. Warmth. Pus or a bad smell. Managing pain, stiffness, and swelling  If directed, put ice on the affected area. To do this: If you have a removable brace or immobilizer, remove it as told by your health care provider. Put ice in a plastic bag. Place a towel between your skin and the bag. Leave the ice on for 20 minutes, 2-3 times a day. Remove the ice if your skin turns bright red. This is very important. If you cannot feel pain, heat, or cold, you have a greater risk of damage to the area. Move your toes often to reduce stiffness and swelling. Raise (elevate) the injured area above the level of your heart while you are sitting or lying down. Activity Do not use your knee to support your body weight until your health care provider says that you can. Use crutches or other devices as told by your health care provider. Do physical therapy exercises as told by your health care provider. Physical therapy will help you regain movement and strength in your knee. Follow instructions from your health care provider about: When you may start motion exercises. When you may start riding a stationary bike and doing other low-impact activities. When you may start to jog and do other high-impact activities. Do not lift anything that is heavier than 10 lb (4.5 kg), or the limit that you are told, until your health care provider says that it is safe. Ask your health care provider what activities are safe for you. General instructions Do not use any products that contain nicotine or tobacco, such as cigarettes, e-cigarettes, and chewing tobacco. These can delay healing. If you need help quitting, ask  your health care provider. Wear compression stockings as told by your health care provider. These stockings help to prevent blood clots and reduce swelling in your legs. Keep all follow-up visits. This is important. Contact a health care provider if: You have any of these signs of infection: Redness around an incision. Blood or more fluid coming from an incision. Warmth coming from an incision. Pus or a bad smell coming from an incision. More swelling or pain in your knee. A fever or chills. You have pain that does not get better with medicine. Your incision opens up. Get help right away if: You have trouble breathing. You have chest pain. You have increased pain or swelling in your calf or at the back of your knee. You have numbness and tingling near the knee joint or in the foot, ankle,  or toes. You notice that your foot or toes look darker than normal or are cooler than normal. These symptoms may represent a serious problem that is an emergency. Do not wait to see if the symptoms will go away. Get medical help right away. Call your local emergency services (911 in the [place] Do not drive yourself to the hospital. Summary After the procedure, it is common to have knee pain with bruising and swelling on your knee, calf, and ankle. Icing your knee and raising your leg above the level of your heart will help control the pain and swelling. Do physical therapy exercises as told by your health care provider. Physical therapy will help you regain movement and strength in your knee. This information is not intended to replace advice given to you by your health care provider. Make sure you discuss any questions you have with your health care provider. Document Revised: 09/27/2019 Document Reviewed: 09/27/2019 Elsevier Patient Education  [facility] Anesthesia, Adult, Care After The following information offers guidance on how to care for yourself after your procedure. Your health  care provider may also give you more specific instructions. If you have problems or questions, contact your health care provider. What can I expect after the procedure? After the procedure, it is common for people to: Have pain or discomfort at the IV site. Have nausea or vomiting. Have a sore throat or hoarseness. Have trouble concentrating. Feel cold or chills. Feel weak, sleepy, or tired (fatigue). Have soreness and body aches. These can affect parts of the body that were not involved in surgery. Follow these instructions at home: For the time period you were told by your health care provider:  Rest. Do not participate in activities where you could fall or become injured. Do not drive or use machinery. Do not drink alcohol. Do not take sleeping pills or medicines that cause drowsiness. Do not make important decisions or sign legal documents. Do not take care of children on your own. General instructions Drink enough fluid to keep your urine pale yellow. If you have sleep apnea, surgery and certain medicines can increase your risk for breathing problems. Follow instructions from your health care provider about wearing your sleep device: Anytime you are sleeping, including during daytime naps. While taking prescription pain medicines, sleeping medicines, or medicines that make you drowsy. Return to your normal activities as told by your health care provider. Ask your health care provider what activities are safe for you. Take over-the-counter and prescription medicines only as told by your health care provider. Do not use any products that contain nicotine or tobacco. These products include cigarettes, chewing tobacco, and vaping devices, such as e-cigarettes. These can delay incision healing after surgery. If you need help quitting, ask your health care provider. Contact a health care provider if: You have nausea or vomiting that does not get better with medicine. You vomit every time  you eat or drink. You have pain that does not get better with medicine. You cannot urinate or have bloody urine. You develop a skin rash. You have a fever. Get help right away if: You have trouble breathing. You have chest pain. You vomit blood. These symptoms may be an emergency. Get help right away. Call 911. Do not wait to see if the symptoms will go away. Do not drive yourself to the hospital. Summary After the procedure, it is common to have a sore throat, hoarseness, nausea, vomiting, or to feel weak, sleepy, or fatigue. For  the time period you were told by your health care provider, do not drive or use machinery. Get help right away if you have difficulty breathing, have chest pain, or vomit blood. These symptoms may be an emergency. This information is not intended to replace advice given to you by your health care provider. Make sure you discuss any questions you have with your health care provider. Document Revised: 07/27/2021 Document Reviewed: 07/27/2021 Elsevier Patient Education  2023 [employer] How to Use Chlorhexidine Before Surgery Chlorhexidine gluconate (CHG) is a germ-killing (antiseptic) solution that is used to clean the skin. It can get rid of the bacteria that normally live on the skin and can keep them away for about 24 hours. To clean your skin with CHG, you may be given: A CHG solution to use in the shower or as part of a sponge bath. A prepackaged cloth that contains CHG. Cleaning your skin with CHG may help lower the risk for infection: While you are staying in the intensive care unit of the hospital. If you have a vascular access, such as a central line, to provide short-term or long-term access to your veins. If you have a catheter to drain urine from your bladder. If you are on a ventilator. A ventilator is a machine that helps you breathe by moving air in and out of your lungs. After surgery. What are the risks? Risks of using CHG include: A skin  reaction. Hearing loss, if CHG gets in your ears and you have a perforated eardrum. Eye injury, if CHG gets in your eyes and is not rinsed out. The CHG product catching fire. Make sure that you avoid smoking and flames after applying CHG to your skin. Do not use CHG: If you have a chlorhexidine allergy or have previously reacted to chlorhexidine. On babies younger than 6 months of age. How to use CHG solution Use CHG only as told by your health care provider, and follow the instructions on the label. Use the full amount of CHG as directed. Usually, this is one bottle. During a shower Follow these steps when using CHG solution during a shower (unless your health care provider gives you different instructions): Start the shower. Use your normal soap and shampoo to wash your face and hair. Turn off the shower or move out of the shower stream. Pour the CHG onto a clean washcloth. Do not use any type of brush or rough-edged sponge. Starting at your neck, lather your body down to your toes. Make sure you follow these instructions: If you will be having surgery, pay special attention to the part of your body where you will be having surgery. Scrub this area for at least 1 minute. Do not use CHG on your head or face. If the solution gets into your ears or eyes, rinse them well with water. Avoid your genital area. Avoid any areas of skin that have broken skin, cuts, or scrapes. Scrub your back and under your arms. Make sure to wash skin folds. Let the lather sit on your skin for 1-2 minutes or as long as told by your health care provider. Thoroughly rinse your entire body in the shower. Make sure that all body creases and crevices are rinsed well. Dry off with a clean towel. Do not put any substances on your body afterward--such as powder, lotion, or perfume--unless you are told to do so by your health care provider. Only use lotions that are recommended by the manufacturer. Put on clean clothes or  pajamas. If it is the night before your surgery, sleep in clean sheets.  During a sponge bath Follow these steps when using CHG solution during a sponge bath (unless your health care provider gives you different instructions): Use your normal soap and shampoo to wash your face and hair. Pour the CHG onto a clean washcloth. Starting at your neck, lather your body down to your toes. Make sure you follow these instructions: If you will be having surgery, pay special attention to the part of your body where you will be having surgery. Scrub this area for at least 1 minute. Do not use CHG on your head or face. If the solution gets into your ears or eyes, rinse them well with water. Avoid your genital area. Avoid any areas of skin that have broken skin, cuts, or scrapes. Scrub your back and under your arms. Make sure to wash skin folds. Let the lather sit on your skin for 1-2 minutes or as long as told by your health care provider. Using a different clean, wet washcloth, thoroughly rinse your entire body. Make sure that all body creases and crevices are rinsed well. Dry off with a clean towel. Do not put any substances on your body afterward--such as powder, lotion, or perfume--unless you are told to do so by your health care provider. Only use lotions that are recommended by the manufacturer. Put on clean clothes or pajamas. If it is the night before your surgery, sleep in clean sheets. How to use CHG prepackaged cloths Only use CHG cloths as told by your health care provider, and follow the instructions on the label. Use the CHG cloth on clean, dry skin. Do not use the CHG cloth on your head or face unless your health care provider tells you to. When washing with the CHG cloth: Avoid your genital area. Avoid any areas of skin that have broken skin, cuts, or scrapes. Before surgery Follow these steps when using a CHG cloth to clean before surgery (unless your health care provider gives you  different instructions): Using the CHG cloth, vigorously scrub the part of your body where you will be having surgery. Scrub using a back-and-forth motion for 3 minutes. The area on your body should be completely wet with CHG when you are done scrubbing. Do not rinse. Discard the cloth and let the area air-dry. Do not put any substances on the area afterward, such as powder, lotion, or perfume. Put on clean clothes or pajamas. If it is the night before your surgery, sleep in clean sheets.  For general bathing Follow these steps when using CHG cloths for general bathing (unless your health care provider gives you different instructions). Use a separate CHG cloth for each area of your body. Make sure you wash between any folds of skin and between your fingers and toes. Wash your body in the following order, switching to a new cloth after each step: The front of your neck, shoulders, and chest. Both of your arms, under your arms, and your hands. Your stomach and groin area, avoiding the genitals. Your right leg and foot. Your left leg and foot. The back of your neck, your back, and your buttocks. Do not rinse. Discard the cloth and let the area air-dry. Do not put any substances on your body afterward--such as powder, lotion, or perfume--unless you are told to do so by your health care provider. Only use lotions that are recommended by the manufacturer. Put on clean clothes or pajamas. Contact a  health care provider if: Your skin gets irritated after scrubbing. You have questions about using your solution or cloth. You swallow any chlorhexidine. Call your local poison control center [phone] [phone] in the [place] Get help right away if: Your eyes itch badly, or they become very red or swollen. Your skin itches badly and is red or swollen. Your hearing changes. You have trouble seeing. You have swelling or tingling in your mouth or throat. You have trouble breathing. These symptoms may  represent a serious problem that is an emergency. Do not wait to see if the symptoms will go away. Get medical help right away. Call your local emergency services (911 in the [place] Do not drive yourself to the hospital. Summary Chlorhexidine gluconate (CHG) is a germ-killing (antiseptic) solution that is used to clean the skin. Cleaning your skin with CHG may help to lower your risk for infection. You may be given CHG to use for bathing. It may be in a bottle or in a prepackaged cloth to use on your skin. Carefully follow your health care provider's instructions and the instructions on the product label. Do not use CHG if you have a chlorhexidine allergy. Contact your health care provider if your skin gets irritated after scrubbing. This information is not intended to replace advice given to you by your health care provider. Make sure you discuss any questions you have with your health care provider. Document Revised: 08/27/2021 Document Reviewed: 07/10/2020 Elsevier Patient Education  2023 [employer].

## 2022-05-22 NOTE — Progress Notes (Signed)
Encounter Diagnosis  Name Primary?   Chronic pain syndrome Yes   Meds ordered this encounter  Medications   methocarbamol (ROBAXIN) 500 MG tablet    Sig: Take 1 tablet (500 mg total) by mouth 3 (three) times daily.    Dispense:  60 tablet    Refill:  1   predniSONE (STERAPRED UNI-PAK 48 TAB) 10 MG (48) TBPK tablet    Sig: Take by mouth daily. 10 mg 12 days as directed    Dispense:  48 tablet    Refill:  0   gabapentin (NEURONTIN) 100 MG capsule    Sig: Take 1 capsule (100 mg total) by mouth 3 (three) times daily.    Dispense:  90 capsule    Refill:  2

## 2022-05-24 ENCOUNTER — Encounter (HOSPITAL_COMMUNITY)
Admission: RE | Admit: 2022-05-24 | Discharge: 2022-05-24 | Disposition: A | Discharge status: Home / Self Care | Payer: Medicaid Other | Source: Ambulatory Visit | Attending: Orthopedic Surgery | Admitting: Orthopedic Surgery

## 2022-05-24 DIAGNOSIS — F1411 Cocaine abuse, in remission: Secondary | ICD-10-CM

## 2022-05-24 DIAGNOSIS — F17200 Nicotine dependence, unspecified, uncomplicated: Secondary | ICD-10-CM

## 2022-05-24 DIAGNOSIS — Z01818 Encounter for other preprocedural examination: Secondary | ICD-10-CM

## 2022-05-27 ENCOUNTER — Other Ambulatory Visit: Payer: Self-pay

## 2022-05-27 ENCOUNTER — Encounter (HOSPITAL_COMMUNITY): Payer: Self-pay

## 2022-05-27 ENCOUNTER — Encounter (HOSPITAL_COMMUNITY)
Admission: RE | Admit: 2022-05-27 | Discharge: 2022-05-27 | Disposition: A | Discharge status: Home / Self Care | Payer: Medicaid Other | Source: Ambulatory Visit | Attending: Orthopedic Surgery | Admitting: Orthopedic Surgery

## 2022-05-27 DIAGNOSIS — F1411 Cocaine abuse, in remission: Secondary | ICD-10-CM | POA: Diagnosis not present

## 2022-05-27 DIAGNOSIS — Z01818 Encounter for other preprocedural examination: Secondary | ICD-10-CM | POA: Diagnosis present

## 2022-05-27 DIAGNOSIS — F17200 Nicotine dependence, unspecified, uncomplicated: Secondary | ICD-10-CM | POA: Insufficient documentation

## 2022-05-27 DIAGNOSIS — M23300 Other meniscus derangements, unspecified lateral meniscus, right knee: Secondary | ICD-10-CM | POA: Insufficient documentation

## 2022-05-27 LAB — CBC WITH DIFFERENTIAL/PLATELET
Abs Immature Granulocytes: 0.03 10*3/uL (ref 0.00–0.07)
Basophils Absolute: 0 10*3/uL (ref 0.0–0.1)
Basophils Relative: 0 %
Eosinophils Absolute: 0.1 10*3/uL (ref 0.0–0.5)
Eosinophils Relative: 2 %
HCT: 35.8 % — ABNORMAL LOW (ref 36.0–46.0)
Hemoglobin: 11.7 g/dL — ABNORMAL LOW (ref 12.0–15.0)
Immature Granulocytes: 1 %
Lymphocytes Relative: 18 %
Lymphs Abs: 1.2 10*3/uL (ref 0.7–4.0)
MCH: 30.2 pg (ref 26.0–34.0)
MCHC: 32.7 g/dL (ref 30.0–36.0)
MCV: 92.5 fL (ref 80.0–100.0)
Monocytes Absolute: 0.3 10*3/uL (ref 0.1–1.0)
Monocytes Relative: 5 %
Neutro Abs: 4.7 10*3/uL (ref 1.7–7.7)
Neutrophils Relative %: 74 %
Platelets: 126 10*3/uL — ABNORMAL LOW (ref 150–400)
RBC: 3.87 MIL/uL (ref 3.87–5.11)
RDW: 15.4 % (ref 11.5–15.5)
WBC: 6.3 10*3/uL (ref 4.0–10.5)
nRBC: 0 % (ref 0.0–0.2)

## 2022-05-27 LAB — RAPID URINE DRUG SCREEN, HOSP PERFORMED
Amphetamines: NOT DETECTED
Barbiturates: NOT DETECTED
Benzodiazepines: NOT DETECTED
Cocaine: NOT DETECTED
Opiates: NOT DETECTED
Tetrahydrocannabinol: POSITIVE — AB

## 2022-05-27 LAB — COMPREHENSIVE METABOLIC PANEL
ALT: 14 U/L (ref 0–44)
AST: 14 U/L — ABNORMAL LOW (ref 15–41)
Albumin: 4 g/dL (ref 3.5–5.0)
Alkaline Phosphatase: 62 U/L (ref 38–126)
Anion gap: 11 (ref 5–15)
BUN: 18 mg/dL (ref 6–20)
CO2: 22 mmol/L (ref 22–32)
Calcium: 8.5 mg/dL — ABNORMAL LOW (ref 8.9–10.3)
Chloride: 104 mmol/L (ref 98–111)
Creatinine, Ser: 0.54 mg/dL (ref 0.44–1.00)
GFR, Estimated: >60 mL/min (ref >60)
Glucose, Bld: 88 mg/dL (ref 70–99)
Potassium: 3.4 mmol/L — ABNORMAL LOW (ref 3.5–5.1)
Sodium: 137 mmol/L (ref 135–145)
Total Bilirubin: 0.4 mg/dL (ref 0.3–1.2)
Total Protein: 6.8 g/dL (ref 6.5–8.1)

## 2022-05-27 LAB — POCT PREGNANCY, URINE: Preg Test, Ur: NEGATIVE

## 2022-05-27 NOTE — Progress Notes (Signed)
Pre op admission testing completed. Pt verbalized understanding on instructions and told to be here at given time

## 2022-05-27 NOTE — H&P (Addendum)
Chief Complaint  Patient presents with   surgery consultation      RT knee/ torn meniscus// referred by Dr.Keeling      HPI:    Dr. Luna Glasgow saw the patient referred her to me for possible meniscus surgery   32 year old female history of knee popping in and out over her lifetime but a few months back she walked into work the knee buckled and popped but she could not pop it back in place eventually workup included an MRI which showed a torn lateral meniscus.  I reviewed the MRI scan images with radiologist who agrees that there is a lateral meniscus tear is peripheral   Her popping seems to be coming from the patellofemoral joint at about 20 degrees of flexion   She would like to proceed with surgical intervention       Past Medical History:  Diagnosis Date   Asthma     Bipolar 1 disorder ([place])     Depression     Substance abuse ([place])      Past Surgical History:  Procedure Laterality Date   CESAREAN SECTION N/A    CHOLECYSTECTOMY     HERNIA REPAIR     MYRINGOTOMY     TUBAL LIGATION     Current Outpatient Medications  Medication Instructions   albuterol (VENTOLIN HFA) 108 (90 Base) MCG/ACT inhaler 2 puffs, Inhalation, Every 4 hours PRN   cholecalciferol (VITAMIN D3) 1,000 Units, Oral, Daily   gabapentin (NEURONTIN) 100 mg, Oral, 3 times daily   HYDROcodone-acetaminophen (NORCO/VICODIN) 5-325 MG tablet One tablet every six hours for pain.   loperamide (IMODIUM) 2 mg, Oral, 4 times daily PRN   methocarbamol (ROBAXIN) 500 mg, Oral, 3 times daily   naproxen (NAPROSYN) 500 mg, Oral, 2 times daily with meals   pantoprazole (PROTONIX) 40 mg, Oral, Daily   potassium chloride SA (KLOR-CON M) 20 MEQ tablet 20 mEq, Oral, 2 times daily   predniSONE (STERAPRED UNI-PAK 48 TAB) 10 MG (48) TBPK tablet Oral, Daily, 10 mg 12 days as directed   Family History  Problem Relation Age of Onset   Hypertension Mother    Diabetes Father    Hypertension Father    Cancer Father    Lung cancer  Father    Breast cancer Maternal Uncle 77   Diabetes Other    Social History   Tobacco Use   Smoking status: Every Day    Packs/day: 0.50    Years: 8.00    Total pack years: 4.00    Types: Cigarettes    Last attempt to quit: 12/21/2015    Years since quitting: 6.4   Smokeless tobacco: Never  Vaping Use   Vaping Use: Never used  Substance Use Topics   Alcohol use: No   Drug use: Not Currently    Types: Marijuana      BP (!) 142/90   Pulse 88   Ht 5\' 1"  (1.549 m)   Wt 136 lb (61.7 kg)   LMP 04/03/2022   SpO2 99%   BMI 25.70 kg/m      General appearance: Well-developed well-nourished no gross deformities  Cardiovascular normal pulse and perfusion normal color without edema  Neurologically no sensation loss or deficits or pathologic reflexes   Psychological: Awake alert and oriented x3 mood and affect normal   Skin no lacerations or ulcerations no nodularity no palpable masses, no erythema or nodularity   Musculoskeletal: Examination of the right knee reveals tenderness along the lateral joint line but she  also has peripatellar tenderness and the clicking and popping occurs as you go from flexion to extension at about 20 degrees.  She has peripatellar tenderness as well.  She also has stable knee in terms of her ligaments and pain when we extend the knee fully   Imaging MRI again was reviewed and the radiologist help with the review.  She has a lateral meniscal tear its peripheral its in the body of the meniscus   A/P   Torn lateral meniscus right knee Popping right knee   The procedure has been fully reviewed with the patient; The risks and benefits of surgery have been discussed and explained and understood. Alternative treatment has also been reviewed, questions were encouraged and answered. The postoperative plan is also been reviewed. \      RE: Surgery Received: Today Derek Jack, MD  Carole Civil, MD Last platelet count on 04/11/2022 was  141.  With that platelet count, she is unlikely to have any problems.  You may proceed with surgery.        Previous Messages    Kagan needs an arthroscopy and a partial meniscectomy of the right knee  3:04 PM 05/27/2022

## 2022-05-28 ENCOUNTER — Encounter (HOSPITAL_COMMUNITY): Payer: Self-pay | Admitting: Orthopedic Surgery

## 2022-05-28 ENCOUNTER — Ambulatory Visit (HOSPITAL_COMMUNITY): Payer: Medicaid Other | Admitting: Certified Registered"

## 2022-05-28 ENCOUNTER — Ambulatory Visit (HOSPITAL_BASED_OUTPATIENT_CLINIC_OR_DEPARTMENT_OTHER): Payer: Medicaid Other | Admitting: Certified Registered"

## 2022-05-28 ENCOUNTER — Ambulatory Visit (HOSPITAL_COMMUNITY)
Admission: RE | Admit: 2022-05-28 | Discharge: 2022-05-28 | Disposition: A | Discharge status: Home / Self Care | Payer: Medicaid Other | Source: Ambulatory Visit | Attending: Orthopedic Surgery | Admitting: Orthopedic Surgery

## 2022-05-28 ENCOUNTER — Other Ambulatory Visit: Payer: Self-pay

## 2022-05-28 ENCOUNTER — Encounter (HOSPITAL_COMMUNITY)
Admission: RE | Disposition: A | Discharge status: Home / Self Care | Payer: Self-pay | Source: Ambulatory Visit | Attending: Orthopedic Surgery

## 2022-05-28 DIAGNOSIS — S83281A Other tear of lateral meniscus, current injury, right knee, initial encounter: Secondary | ICD-10-CM | POA: Insufficient documentation

## 2022-05-28 DIAGNOSIS — F319 Bipolar disorder, unspecified: Secondary | ICD-10-CM | POA: Insufficient documentation

## 2022-05-28 DIAGNOSIS — M25561 Pain in right knee: Secondary | ICD-10-CM | POA: Diagnosis not present

## 2022-05-28 DIAGNOSIS — M23300 Other meniscus derangements, unspecified lateral meniscus, right knee: Secondary | ICD-10-CM

## 2022-05-28 DIAGNOSIS — F17210 Nicotine dependence, cigarettes, uncomplicated: Secondary | ICD-10-CM | POA: Insufficient documentation

## 2022-05-28 DIAGNOSIS — J45909 Unspecified asthma, uncomplicated: Secondary | ICD-10-CM | POA: Insufficient documentation

## 2022-05-28 DIAGNOSIS — X58XXXA Exposure to other specified factors, initial encounter: Secondary | ICD-10-CM | POA: Insufficient documentation

## 2022-05-28 HISTORY — PX: KNEE ARTHROSCOPY WITH LATERAL MENISECTOMY: SHX6193

## 2022-05-28 HISTORY — PX: KNEE CARTILAGE SURGERY: SHX688

## 2022-05-28 SURGERY — ARTHROSCOPY, KNEE, WITH LATERAL MENISCECTOMY
Anesthesia: General | Site: Knee | Laterality: Right

## 2022-05-28 MED ORDER — OXYCODONE HCL 5 MG PO TABS
5.0000 mg | ORAL_TABLET | Freq: Once | ORAL | Status: AC | PRN
  Administered 2022-05-28: 5 mg via ORAL
  Filled 2022-05-28: qty 1

## 2022-05-28 MED ORDER — ACETAMINOPHEN 10 MG/ML IV SOLN
INTRAVENOUS | Status: AC
  Filled 2022-05-28: qty 100

## 2022-05-28 MED ORDER — PHENYLEPHRINE 80 MCG/ML (10ML) SYRINGE FOR IV PUSH (FOR BLOOD PRESSURE SUPPORT)
PREFILLED_SYRINGE | INTRAVENOUS | Status: DC | PRN
  Administered 2022-05-28 (×2): 160 ug via INTRAVENOUS
  Administered 2022-05-28: 80 ug via INTRAVENOUS
  Administered 2022-05-28: 160 ug via INTRAVENOUS

## 2022-05-28 MED ORDER — VANCOMYCIN HCL IN DEXTROSE 1-5 GM/200ML-% IV SOLN
INTRAVENOUS | Status: AC
  Filled 2022-05-28: qty 200

## 2022-05-28 MED ORDER — OXYCODONE HCL 5 MG/5ML PO SOLN: 5.0000 mg | Freq: Once | ORAL | Status: AC | PRN

## 2022-05-28 MED ORDER — HYDROCODONE-ACETAMINOPHEN 5-325 MG PO TABS: 1.0000 | ORAL_TABLET | ORAL | 0 refills | Status: DC | PRN

## 2022-05-28 MED ORDER — DEXAMETHASONE SODIUM PHOSPHATE 10 MG/ML IJ SOLN
INTRAMUSCULAR | Status: DC | PRN
  Administered 2022-05-28: 8 mg via INTRAVENOUS

## 2022-05-28 MED ORDER — ONDANSETRON HCL 4 MG/2ML IJ SOLN
INTRAMUSCULAR | Status: DC | PRN
  Administered 2022-05-28: 4 mg via INTRAVENOUS

## 2022-05-28 MED ORDER — ORAL CARE MOUTH RINSE: 15.0000 mL | Freq: Once | OROMUCOSAL | Status: AC

## 2022-05-28 MED ORDER — LACTATED RINGERS IV SOLN: INTRAVENOUS | Status: DC | PRN

## 2022-05-28 MED ORDER — CHLORHEXIDINE GLUCONATE 0.12 % MT SOLN
15.0000 mL | Freq: Once | OROMUCOSAL | Status: AC
  Administered 2022-05-28: 15 mL via OROMUCOSAL

## 2022-05-28 MED ORDER — DEXMEDETOMIDINE HCL IN NACL 80 MCG/20ML IV SOLN
INTRAVENOUS | Status: DC | PRN
  Administered 2022-05-28 (×2): 4 ug via BUCCAL

## 2022-05-28 MED ORDER — MIDAZOLAM HCL 2 MG/2ML IJ SOLN
INTRAMUSCULAR | Status: AC
  Filled 2022-05-28: qty 2

## 2022-05-28 MED ORDER — VANCOMYCIN HCL IN DEXTROSE 1-5 GM/200ML-% IV SOLN
1000.0000 mg | INTRAVENOUS | Status: AC
  Administered 2022-05-28: 1000 mg via INTRAVENOUS

## 2022-05-28 MED ORDER — ONDANSETRON HCL 4 MG/2ML IJ SOLN
INTRAMUSCULAR | Status: AC
  Filled 2022-05-28: qty 2

## 2022-05-28 MED ORDER — FENTANYL CITRATE (PF) 250 MCG/5ML IJ SOLN
INTRAMUSCULAR | Status: AC
  Filled 2022-05-28: qty 5

## 2022-05-28 MED ORDER — BUPIVACAINE-EPINEPHRINE (PF) 0.5% -1:200000 IJ SOLN
INTRAMUSCULAR | Status: DC | PRN
  Administered 2022-05-28: 60 mL via PERINEURAL

## 2022-05-28 MED ORDER — BUPIVACAINE-EPINEPHRINE (PF) 0.5% -1:200000 IJ SOLN
INTRAMUSCULAR | Status: AC
  Filled 2022-05-28: qty 60

## 2022-05-28 MED ORDER — MIDAZOLAM HCL 5 MG/5ML IJ SOLN
INTRAMUSCULAR | Status: DC | PRN
  Administered 2022-05-28: 2 mg via INTRAVENOUS

## 2022-05-28 MED ORDER — SODIUM CHLORIDE 0.9 % IR SOLN
Status: DC | PRN
  Administered 2022-05-28 (×2): 3000 mL

## 2022-05-28 MED ORDER — FENTANYL CITRATE PF 50 MCG/ML IJ SOSY: 25.0000 ug | PREFILLED_SYRINGE | INTRAMUSCULAR | Status: DC | PRN

## 2022-05-28 MED ORDER — DEXAMETHASONE SODIUM PHOSPHATE 10 MG/ML IJ SOLN
INTRAMUSCULAR | Status: AC
  Filled 2022-05-28: qty 1

## 2022-05-28 MED ORDER — LACTATED RINGERS IV SOLN: INTRAVENOUS | Status: DC

## 2022-05-28 MED ORDER — VANCOMYCIN HCL 1000 MG IV SOLR
INTRAVENOUS | Status: DC | PRN
  Administered 2022-05-28: 1000 mg via INTRAVENOUS

## 2022-05-28 MED ORDER — LIDOCAINE 2% (20 MG/ML) 5 ML SYRINGE
INTRAMUSCULAR | Status: DC | PRN
  Administered 2022-05-28: 60 mg via INTRAVENOUS

## 2022-05-28 MED ORDER — FENTANYL CITRATE (PF) 100 MCG/2ML IJ SOLN
INTRAMUSCULAR | Status: DC | PRN
  Administered 2022-05-28 (×3): 50 ug via INTRAVENOUS

## 2022-05-28 MED ORDER — LIDOCAINE HCL (PF) 2 % IJ SOLN
INTRAMUSCULAR | Status: AC
  Filled 2022-05-28: qty 5

## 2022-05-28 MED ORDER — EPINEPHRINE PF 1 MG/ML IJ SOLN
INTRAMUSCULAR | Status: AC
  Filled 2022-05-28: qty 8

## 2022-05-28 MED ORDER — PROPOFOL 10 MG/ML IV BOLUS
INTRAVENOUS | Status: AC
  Filled 2022-05-28: qty 20

## 2022-05-28 MED ORDER — EPHEDRINE SULFATE-NACL 50-0.9 MG/10ML-% IV SOSY
PREFILLED_SYRINGE | INTRAVENOUS | Status: DC | PRN
  Administered 2022-05-28: 5 mg via INTRAVENOUS

## 2022-05-28 MED ORDER — ONDANSETRON HCL 4 MG/2ML IJ SOLN: 4.0000 mg | Freq: Once | INTRAMUSCULAR | Status: DC | PRN

## 2022-05-28 MED ORDER — BUPIVACAINE-EPINEPHRINE (PF) 0.5% -1:200000 IJ SOLN
INTRAMUSCULAR | Status: AC
  Filled 2022-05-28: qty 30

## 2022-05-28 MED ORDER — PROPOFOL 10 MG/ML IV BOLUS
INTRAVENOUS | Status: DC | PRN
  Administered 2022-05-28: 120 mg via INTRAVENOUS

## 2022-05-28 MED ORDER — ACETAMINOPHEN 10 MG/ML IV SOLN
INTRAVENOUS | Status: DC | PRN
  Administered 2022-05-28: 1000 mg via INTRAVENOUS

## 2022-05-28 MED ORDER — LABETALOL HCL 5 MG/ML IV SOLN
INTRAVENOUS | Status: AC
  Filled 2022-05-28: qty 4

## 2022-05-28 MED ORDER — EPHEDRINE 5 MG/ML INJ
INTRAVENOUS | Status: AC
  Filled 2022-05-28: qty 5

## 2022-05-28 SURGICAL SUPPLY — 40 items
APL PRP STRL LF DISP 70% ISPRP (MISCELLANEOUS) ×1
BNDG CMPR STD VLCR NS LF 5.8X6 (GAUZE/BANDAGES/DRESSINGS) ×1
BNDG ELASTIC 6X5.8 VLCR NS LF (GAUZE/BANDAGES/DRESSINGS) ×2 IMPLANT
CHLORAPREP W/TINT 26 (MISCELLANEOUS) ×4 IMPLANT
CLOTH BEACON ORANGE TIMEOUT ST (SAFETY) ×2 IMPLANT
COOLER ICEMAN CLASSIC (MISCELLANEOUS) ×2 IMPLANT
CUFF TOURN SGL QUICK 34 (TOURNIQUET CUFF) ×1
CUFF TRNQT CYL 34X4.125X (TOURNIQUET CUFF) IMPLANT
GAUZE SPONGE 4X4 12PLY STRL (GAUZE/BANDAGES/DRESSINGS) ×2 IMPLANT
GAUZE XEROFORM 5X9 LF (GAUZE/BANDAGES/DRESSINGS) ×2 IMPLANT
GLOVE BIOGEL PI IND STRL 7.0 (GLOVE) ×4 IMPLANT
GLOVE SS N UNI LF 8.5 STRL (GLOVE) ×2 IMPLANT
GLOVE SURG POLYISO LF SZ8 (GLOVE) ×2 IMPLANT
GOWN STRL REUS W/TWL LRG LVL3 (GOWN DISPOSABLE) ×2 IMPLANT
GOWN STRL REUS W/TWL XL LVL3 (GOWN DISPOSABLE) ×2 IMPLANT
IV NS IRRIG 3000ML ARTHROMATIC (IV SOLUTION) ×4 IMPLANT
KIT BLADEGUARD II DBL (SET/KITS/TRAYS/PACK) ×2 IMPLANT
KIT TURNOVER CYSTO (KITS) ×2 IMPLANT
MANIFOLD NEPTUNE II (INSTRUMENTS) ×2 IMPLANT
MARKER SKIN DUAL TIP RULER LAB (MISCELLANEOUS) ×2 IMPLANT
NDL HYPO 18GX1.5 BLUNT FILL (NEEDLE) ×2 IMPLANT
NDL HYPO 21X1.5 SAFETY (NEEDLE) ×2 IMPLANT
NDL SPNL 18GX3.5 QUINCKE PK (NEEDLE) ×2 IMPLANT
NEEDLE HYPO 18GX1.5 BLUNT FILL (NEEDLE) ×1 IMPLANT
NEEDLE HYPO 21X1.5 SAFETY (NEEDLE) ×1 IMPLANT
NEEDLE SPNL 18GX3.5 QUINCKE PK (NEEDLE) ×1 IMPLANT
PACK ARTHRO LIMB DRAPE STRL (MISCELLANEOUS) ×2 IMPLANT
PAD ABD 5X9 TENDERSORB (GAUZE/BANDAGES/DRESSINGS) ×2 IMPLANT
PAD ARMBOARD 7.5X6 YLW CONV (MISCELLANEOUS) ×2 IMPLANT
PAD COLD SHLDR SM WRAP-ON (PAD) IMPLANT
PAD FOR LEG HOLDER (MISCELLANEOUS) ×2 IMPLANT
PADDING CAST COTTON 6X4 STRL (CAST SUPPLIES) ×2 IMPLANT
RESECTOR TORPEDO 4MM 13CM CVD (MISCELLANEOUS) IMPLANT
SET ARTHROSCOPY INST (INSTRUMENTS) ×2 IMPLANT
SET BASIN LINEN APH (SET/KITS/TRAYS/PACK) ×2 IMPLANT
SUT ETHILON 3 0 FSL (SUTURE) ×2 IMPLANT
SYR 10ML LL (SYRINGE) IMPLANT
SYR 30ML LL (SYRINGE) ×2 IMPLANT
TUBE CONNECTING 12X1/4 (SUCTIONS) ×4 IMPLANT
TUBING IN/OUT FLOW W/MAIN PUMP (TUBING) ×2 IMPLANT

## 2022-05-28 NOTE — Transfer of Care (Signed)
Immediate Anesthesia Transfer of Care Note  Patient: Brittany Payne  Procedure(s) Performed: DIAGNOSTIC KNEE ARTHROSCOPY (Right: Knee)  Patient Location: PACU  Anesthesia Type:General  Level of Consciousness: drowsy  Airway & Oxygen Therapy: Patient Spontanous Breathing  Post-op Assessment: Report given to RN and Post -op Vital signs reviewed and stable  Post vital signs: Reviewed and stable  Last Vitals:  Vitals Value Taken Time  BP 98/62 05/28/22 1235  Temp 36.6 C 05/28/22 1232  Pulse 68 05/28/22 1241  Resp 14 05/28/22 1241  SpO2 96 % 05/28/22 1241  Vitals shown include unvalidated device data.  Last Pain:  Vitals:   05/28/22 0955  PainSc: 0-No pain         Complications: No notable events documented.

## 2022-05-28 NOTE — Interval H&P Note (Signed)
History and Physical Interval Note:  05/28/2022 11:06 AM  Brittany Payne  has presented today for surgery, with the diagnosis of Torn lateral meniscus right knee.  The various methods of treatment have been discussed with the patient and family. After consideration of risks, benefits and other options for treatment, the patient has consented to  Procedure(s): KNEE ARTHROSCOPY WITH LATERAL MENISCECTOMY (Right) as a surgical intervention.  The patient's history has been reviewed, patient examined, no change in status, stable for surgery.  I have reviewed the patient's chart and labs.  Questions were answered to the patient's satisfaction.     Arther Abbott

## 2022-05-28 NOTE — Anesthesia Procedure Notes (Signed)
Procedure Name: LMA Insertion Date/Time: 05/28/2022 11:38 AM  Performed by: Gwyndolyn Saxon, CRNAPre-anesthesia Checklist: Patient identified, Emergency Drugs available, Suction available and Patient being monitored Patient Re-evaluated:Patient Re-evaluated prior to induction Oxygen Delivery Method: Circle system utilized Preoxygenation: Pre-oxygenation with 100% oxygen Induction Type: IV induction Ventilation: Mask ventilation without difficulty LMA: LMA with gastric port inserted LMA Size: 4.0 Number of attempts: 1 Placement Confirmation: positive ETCO2 and breath sounds checked- equal and bilateral Dental Injury: Teeth and Oropharynx as per pre-operative assessment

## 2022-05-28 NOTE — Brief Op Note (Signed)
05/28/2022  12:45 PM  PATIENT:  Brittany Payne  32 y.o. female  PRE-OPERATIVE DIAGNOSIS:  Torn lateral meniscus right knee  POST-OPERATIVE DIAGNOSIS:  Knee Pain  PROCEDURE:  Procedure(s): DIAGNOSTIC KNEE ARTHROSCOPY (Right)  SURGEON:  Surgeon(s) and Role:    Carole Civil, MD - Primary  PHYSICIAN ASSISTANT:   ASSISTANTS: none   ANESTHESIA:   general  EBL:  5 mL   BLOOD ADMINISTERED:none  DRAINS: none   LOCAL MEDICATIONS USED:  MARCAINE     SPECIMEN:  No Specimen  DISPOSITION OF SPECIMEN:  N/A  COUNTS:  YES  TOURNIQUET:  * Missing tourniquet times found for documented tourniquets in log: [id number] *  DICTATION: .Dragon Dictation  PLAN OF CARE: Discharge to home after PACU  PATIENT DISPOSITION:  PACU - hemodynamically stable.   Delay start of Pharmacological VTE agent (>24hrs) due to surgical blood loss or risk of bleeding: not applicable

## 2022-05-28 NOTE — Anesthesia Preprocedure Evaluation (Signed)
Anesthesia Evaluation  Patient identified by MRN, date of birth, ID band Patient awake    Reviewed: Allergy & Precautions, H&P , NPO status , Patient's Chart, lab work & pertinent test results, reviewed documented beta blocker date and time   Airway Mallampati: II  TM Distance: >3 FB Neck ROM: full    Dental no notable dental hx.    Pulmonary neg pulmonary ROS, asthma , Current Smoker   Pulmonary exam normal breath sounds clear to auscultation       Cardiovascular Exercise Tolerance: Good negative cardio ROS  Rhythm:regular Rate:Normal     Neuro/Psych  Headaches PSYCHIATRIC DISORDERS Anxiety Depression Bipolar Disorder   negative neurological ROS  negative psych ROS   GI/Hepatic negative GI ROS,,,(+)     substance abuse  marijuana use  Endo/Other  negative endocrine ROS    Renal/GU negative Renal ROS  negative genitourinary   Musculoskeletal   Abdominal   Peds  Hematology negative hematology ROS (+) Blood dyscrasia, anemia   Anesthesia Other Findings   Reproductive/Obstetrics negative OB ROS                             Anesthesia Physical Anesthesia Plan  ASA: 2  Anesthesia Plan: General and General LMA   Post-op Pain Management:    Induction:   PONV Risk Score and Plan: Ondansetron  Airway Management Planned:   Additional Equipment:   Intra-op Plan:   Post-operative Plan:   Informed Consent: I have reviewed the patients History and Physical, chart, labs and discussed the procedure including the risks, benefits and alternatives for the proposed anesthesia with the patient or authorized representative who has indicated his/her understanding and acceptance.     Dental Advisory Given  Plan Discussed with: CRNA  Anesthesia Plan Comments:        Anesthesia Quick Evaluation

## 2022-05-28 NOTE — Op Note (Signed)
05/28/2022  12:46 PM  Knee arthroscopy dictation  Preop diagnosis tear lateral meniscus right knee  Postop diagnosis right knee pain  Procedure arthroscopy diagnostic arthroscopy right knee  Surgeon Aline Brochure  Operative findings  MEDIAL - meniscus normal -articular surface normal  LATERAL - meniscus normal -articular surface, normal  PTF   -articular surface normal  NOTCH  -acl normal -pcl normal      The patient was identified in the preoperative holding area using 2 approved identification mechanisms. The chart was reviewed and updated. The surgical site was confirmed as right knee and marked with an indelible marker.  The patient was taken to the operating room for anesthesia. After successful  general  anesthesia, the appropriate antibiotic  was used as IV antibiotics.  The patient was placed in the supine position with the (right) the operative extremity in an arthroscopic leg holder and the opposite extremity in a padded leg holder.  The timeout was executed.  A lateral portal was established with an 11 blade and the scope was introduced into the joint. A diagnostic arthroscopy was performed in circumferential manner examining the entire knee joint. A medial portal was established and the diagnostic arthroscopy was repeated using a probe to palpate intra-articular structures as they were encountered.   Medial and lateral meniscus were extensively probed and found to have no tearing.  The scope was placed in the opposite portal medially to reevaluate the beach meniscus from a different view and they were reprobed and found to be normal  The arthroscopic pump was placed on the wash mode and any excess debris was removed from the joint using suction.  60 cc of Marcaine with epinephrine was injected through the arthroscope.  The portals were closed with 3-0 nylon suture.  A sterile bandage, Ace wrap and Cryo/Cuff was placed and the Cryo/Cuff was activated. The  patient was taken to the recovery room in stable condition.  PHYSICIAN ASSISTANT: no  ASSISTANTS: none   ANESTHESIA: General  EBL:  none   BLOOD ADMINISTERED:none  DRAINS: none   LOCAL MEDICATIONS USED:  MARCAINE     SPECIMEN:  No Specimen  DISPOSITION OF SPECIMEN:  N/A  COUNTS:  YES   DICTATION: .Dragon Dictation  PLAN OF CARE: Discharge to home after PACU  PATIENT DISPOSITION:  PACU - hemodynamically stable.   Delay start of Pharmacological VTE agent (>24hrs) due to surgical blood loss or risk of bleeding: not applicable  POST OP PLAN  WB as tolerated SUTURES OUT IN A WEEK  AROM  BRACE not necessary

## 2022-05-28 NOTE — Progress Notes (Signed)
Instructed on incentive spirometer. 2500 ml obtained. Tolerated well. 

## 2022-05-29 ENCOUNTER — Telehealth: Payer: Self-pay | Admitting: Orthopedic Surgery

## 2022-05-29 NOTE — Telephone Encounter (Signed)
Patient lvm requesting a call from Dr. Ruthe Mannan nurse.  Pt's # [phone]

## 2022-05-29 NOTE — Anesthesia Postprocedure Evaluation (Signed)
Anesthesia Post Note  Patient: Brittany Payne  Procedure(s) Performed: DIAGNOSTIC KNEE ARTHROSCOPY (Right: Knee)  Patient location during evaluation: Phase II Anesthesia Type: General Level of consciousness: awake Pain management: pain level controlled Vital Signs Assessment: post-procedure vital signs reviewed and stable Respiratory status: spontaneous breathing and respiratory function stable Cardiovascular status: blood pressure returned to baseline and stable Postop Assessment: no headache and no apparent nausea or vomiting Anesthetic complications: no Comments: Late entry   No notable events documented.   Last Vitals:  Vitals:   05/28/22 1300 05/28/22 1317  BP: 116/82 125/77  Pulse: 85 85  Resp: 16 16  Temp:  36.6 C  SpO2: 100% 99%    Last Pain:  Vitals:   05/28/22 1317  TempSrc: Oral  PainSc: 0-No pain                 Louann Sjogren

## 2022-05-30 ENCOUNTER — Telehealth: Payer: Self-pay | Admitting: Orthopedic Surgery

## 2022-05-30 ENCOUNTER — Telehealth: Payer: Self-pay

## 2022-05-30 NOTE — Telephone Encounter (Signed)
Patient left message on voicemail stating that her "freaking leg is hurting her real bad right now".  She is asking for a return call from nurse @ [phone]

## 2022-05-30 NOTE — Telephone Encounter (Signed)
Patient lvm requesting for Amy to call her, stated she had a heartbeat of pain going from her toes all the way above her thigh.  Pt's # [phone]

## 2022-05-30 NOTE — Telephone Encounter (Signed)
Left another message told her its likely duplicate since I already talked to her this morning but to call back if its not.

## 2022-05-30 NOTE — Telephone Encounter (Signed)
I discussed with Dr Aline Brochure he advised use ice ibuprofen tylenol do the knee bends I discussed with patient she voiced understanding  She had a normal knee nothing was repaired or shaved so no need to work in.

## 2022-05-30 NOTE — Telephone Encounter (Signed)
I spoke to her this morning  Can you help with this ?

## 2022-05-30 NOTE — Telephone Encounter (Signed)
I discussed with Dr Aline Brochure this morning Ice elevate and take tylenol ibuprofen.  Take hydrocodone prn Dr Aline Brochure did not have to do anything in her knee there is nothing for [place] to check.

## 2022-05-31 ENCOUNTER — Encounter: Payer: Self-pay | Admitting: Orthopedic Surgery

## 2022-06-03 ENCOUNTER — Encounter (HOSPITAL_COMMUNITY): Payer: Self-pay

## 2022-06-03 ENCOUNTER — Emergency Department (HOSPITAL_COMMUNITY): Payer: Medicaid Other

## 2022-06-03 ENCOUNTER — Emergency Department (HOSPITAL_COMMUNITY)
Admission: EM | Admit: 2022-06-03 | Discharge: 2022-06-03 | Disposition: A | Discharge status: Home / Self Care | Payer: Medicaid Other | Attending: Emergency Medicine | Admitting: Emergency Medicine

## 2022-06-03 ENCOUNTER — Other Ambulatory Visit: Payer: Self-pay

## 2022-06-03 DIAGNOSIS — M25561 Pain in right knee: Secondary | ICD-10-CM | POA: Insufficient documentation

## 2022-06-03 DIAGNOSIS — J45909 Unspecified asthma, uncomplicated: Secondary | ICD-10-CM | POA: Diagnosis not present

## 2022-06-03 DIAGNOSIS — G8918 Other acute postprocedural pain: Secondary | ICD-10-CM | POA: Insufficient documentation

## 2022-06-03 DIAGNOSIS — F17210 Nicotine dependence, cigarettes, uncomplicated: Secondary | ICD-10-CM | POA: Diagnosis not present

## 2022-06-03 MED ORDER — OXYCODONE HCL 5 MG PO TABS: 5.0000 mg | ORAL_TABLET | ORAL | 0 refills | Status: DC | PRN

## 2022-06-03 MED ORDER — HYDROMORPHONE HCL 1 MG/ML IJ SOLN
1.0000 mg | Freq: Once | INTRAMUSCULAR | Status: AC
  Administered 2022-06-03: 1 mg via INTRAMUSCULAR
  Filled 2022-06-03: qty 1

## 2022-06-03 NOTE — Discharge Instructions (Signed)
You were evaluated in the Emergency Department and after careful evaluation, we did not find any emergent condition requiring admission or further testing in the hospital.  Your exam/testing today is overall reassuring.  Important to keep your follow-up with your surgeon, try to stay off the leg is much as you can.  Use the pain medicines as we discussed.  Please return to the Emergency Department if you experience any worsening of your condition.   Thank you for allowing [place] to be a part of your care.

## 2022-06-03 NOTE — ED Triage Notes (Signed)
Pt arrived from home via POV c/o right knee pain and throbbing following recent surgery on 05/28/2022 to her lateral meniscus and reports following her surgery she is still experiencing and feeling her meniscus popping. Pt reports following therapy plan with cryotherapy and is not feeling any improvement.

## 2022-06-03 NOTE — ED Notes (Signed)
X-Ray at bedside.

## 2022-06-03 NOTE — ED Provider Notes (Signed)
[facility] Emergency Department Provider Note MRN:  [id number]  Arrival date & time: 06/03/22     Chief Complaint   Post-op Problem   History of Present Illness   Brittany Payne is a 32 y.o. year-old female with a history of bipolar disorder presenting to the ED with chief complaint of postop problem.  Recent knee surgery, has not been able to stay off of the leg as she was told because she has a very active child and no one else to help her with the child.  Continued soreness to the medial aspect of the right knee.  Denies falls, no fever, no other complaints.  No chest pain or shortness of breath.  Review of Systems  A thorough review of systems was obtained and all systems are negative except as noted in the HPI and PMH.   Patient's Health History    Past Medical History:  Diagnosis Date   Asthma    Bipolar 1 disorder ([place])    Depression    Substance abuse ([place])     Past Surgical History:  Procedure Laterality Date   CESAREAN SECTION N/A    CHOLECYSTECTOMY     HERNIA REPAIR     KNEE CARTILAGE SURGERY Right 05/28/2022   MYRINGOTOMY     TUBAL LIGATION      Family History  Problem Relation Age of Onset   Hypertension Mother    Diabetes Father    Hypertension Father    Cancer Father    Lung cancer Father    Breast cancer Maternal Uncle 45   Diabetes Other     Social History   Socioeconomic History   Marital status: Single    Spouse name: Not on file   Number of children: 4   Years of education: Not on file   Highest education level: Not on file  Occupational History   Not on file  Tobacco Use   Smoking status: Every Day    Packs/day: 0.50    Years: 8.00    Total pack years: 4.00    Types: Cigarettes    Last attempt to quit: 12/21/2015    Years since quitting: 6.4   Smokeless tobacco: Never  Vaping Use   Vaping Use: Never used  Substance and Sexual Activity   Alcohol use: No   Drug use: Not Currently    Types: Marijuana    Sexual activity: Yes    Birth control/protection: None  Other Topics Concern   Not on file  Social History Narrative   Not on file   Social Determinants of Health   Financial Resource Strain: Low Risk  (11/07/2020)   Overall Financial Resource Strain (CARDIA)    Difficulty of Paying Living Expenses: Not hard at all  Food Insecurity: No Food Insecurity (11/07/2020)   Hunger Vital Sign    Worried About Running Out of Food in the Last Year: Never true    Ran Out of Food in the Last Year: Never true  Transportation Needs: No Transportation Needs (11/07/2020)   PRAPARE - Hydrologist (Medical): No    Lack of Transportation (Non-Medical): No  Physical Activity: Sufficiently Active (11/07/2020)   Exercise Vital Sign    Days of Exercise per Week: 7 days    Minutes of Exercise per Session: 60 min  Stress: No Stress Concern Present (11/07/2020)   [place]    Feeling of Stress : Not at all  Social Connections: Moderately Integrated (11/07/2020)   Social Connection and Isolation Panel [NHANES]    Frequency of Communication with Friends and Family: More than three times a week    Frequency of Social Gatherings with Friends and Family: Once a week    Attends Religious Services: More than 4 times per year    Active Member of Clubs or Organizations: No    Attends Archivist Meetings: Never    Marital Status: Living with partner  Intimate Partner Violence: Not on file     Physical Exam   Vitals:   06/03/22 0223  BP: (!) 133/96  Pulse: 95  Resp: 16  Temp: 98 F (36.7 C)  SpO2: 100%    CONSTITUTIONAL: Well-appearing, NAD NEURO/PSYCH:  Alert and oriented x 3, no focal deficits EYES:  eyes equal and reactive ENT/NECK:  no LAD, no JVD CARDIO: Regular rate, well-perfused, normal S1 and S2 PULM:  CTAB no wheezing or rhonchi GI/GU:  non-distended, non-tender MSK/SPINE:  No gross  deformities, no edema SKIN:  no rash, atraumatic   *Additional and/or pertinent findings included in MDM below  Diagnostic and Interventional Summary    EKG Interpretation  Date/Time:    Ventricular Rate:    PR Interval:    QRS Duration:   QT Interval:    QTC Calculation:   R Axis:     Text Interpretation:         Labs Reviewed - No data to display  DG Knee Complete 4 Views Right  Final Result      Medications  HYDROmorphone (DILAUDID) injection 1 mg (1 mg Intramuscular Given 06/03/22 0240)     Procedures  /  Critical Care Procedures  ED Course and Medical Decision Making  Initial Impression and Ddx Postop day 7 lateral meniscus repair done by Dr. Aline Brochure.  Overall the leg and knee are well-appearing.  She does not have significant swelling to the leg, no erythema, no increased warmth.  She has preserved range of motion.  She has tenderness and pain on the medial aspect.  Little to no concern for DVT, nothing to suggest septic joint.  Based on the history pain likely due to using and being on the leg more often than she is supposed to.  Obtaining screening x-ray, pain control, will reassess.  Past medical/surgical history that increases complexity of ED encounter: Recent meniscus repair of the leg  Interpretation of Diagnostics I personally reviewed the knee x-ray and my interpretation is as follows: No obvious fracture or abnormality    Patient Reassessment and Ultimate Disposition/Management     Feeling better, appropriate for discharge, has follow-up with her surgeon in 2 days.  Patient management required discussion with the following services or consulting groups:  None  Complexity of Problems Addressed Acute complicated illness or Injury  Additional Data Reviewed and Analyzed Further history obtained from: None  Additional Factors Impacting ED Encounter Risk Prescriptions  Barth Kirks. Sedonia Small, MD [place] [email or web address]  Final Clinical Impressions(s) / ED Diagnoses     ICD-10-CM   1. Post-operative pain  G89.18     2. Acute pain of right knee  M25.561       ED Discharge Orders          Ordered    oxyCODONE (ROXICODONE) 5 MG immediate release tablet  Every 4 hours PRN        06/03/22 0349  Discharge Instructions Discussed with and Provided to Patient:     Discharge Instructions      You were evaluated in the Emergency Department and after careful evaluation, we did not find any emergent condition requiring admission or further testing in the hospital.  Your exam/testing today is overall reassuring.  Important to keep your follow-up with your surgeon, try to stay off the leg is much as you can.  Use the pain medicines as we discussed.  Please return to the Emergency Department if you experience any worsening of your condition.   Thank you for allowing [place] to be a part of your care.       Sabas Sous, MD 06/03/22 [phone]

## 2022-06-03 NOTE — ED Notes (Signed)
ED Provider at bedside. 

## 2022-06-04 DIAGNOSIS — Z9889 Other specified postprocedural states: Secondary | ICD-10-CM | POA: Insufficient documentation

## 2022-06-05 ENCOUNTER — Ambulatory Visit (INDEPENDENT_AMBULATORY_CARE_PROVIDER_SITE_OTHER): Payer: Medicaid Other | Admitting: Orthopedic Surgery

## 2022-06-05 ENCOUNTER — Encounter: Payer: Self-pay | Admitting: Orthopedic Surgery

## 2022-06-05 DIAGNOSIS — Z9889 Other specified postprocedural states: Secondary | ICD-10-CM

## 2022-06-05 NOTE — Progress Notes (Signed)
Chief Complaint  Patient presents with   Post-op Follow-up    Right knee scope 05/28/22    32 year old female who was brought to the operating room for possible meniscectomy for lateral meniscus tear  However we did not find any tear of the meniscus and the remaining portion of the knee was completely normal  Postoperatively she had severe pain requiring emergency room visit.  The etiology at this point is unknown  Plan at this point is to regain knee motion control swelling get her to the point where she can weight-bear again and then we can try to workup this unknown etiology of the pain she is having in her left leg  Right knee is swollen.  Patient complains of some medial pain as well.  At this point again not sure why she is having popping in the knee when there is no tear or intra-articular pathology  Recommend ice

## 2022-06-10 ENCOUNTER — Encounter: Payer: Self-pay | Admitting: Orthopedic Surgery

## 2022-06-10 ENCOUNTER — Encounter: Payer: Medicaid Other | Admitting: Orthopedic Surgery

## 2022-06-10 ENCOUNTER — Ambulatory Visit (INDEPENDENT_AMBULATORY_CARE_PROVIDER_SITE_OTHER): Payer: Medicaid Other | Admitting: Orthopedic Surgery

## 2022-06-10 ENCOUNTER — Telehealth: Payer: Self-pay | Admitting: Orthopedic Surgery

## 2022-06-10 DIAGNOSIS — G894 Chronic pain syndrome: Secondary | ICD-10-CM

## 2022-06-10 DIAGNOSIS — Z9889 Other specified postprocedural states: Secondary | ICD-10-CM

## 2022-06-10 MED ORDER — HYDROCODONE-ACETAMINOPHEN 5-325 MG PO TABS: 1.0000 | ORAL_TABLET | Freq: Four times a day (QID) | ORAL | 0 refills | Status: DC | PRN

## 2022-06-10 NOTE — Progress Notes (Signed)
Chief Complaint  Patient presents with   Knee Pain    RT knee/sp knee scope 05/28/2022 C/o swelling, tingling, pain   Postop arthroscopy normal knee scope still has some right ankle edema does not look like a clot she is using an Ace wrap she is having pain and clicking in the knee I found no explanation for the clicking or pain in her knee or leg  Recommend  She see the neurologist  She should see pain management specialist   There was no surgical problem found with the knee  Meds ordered this encounter  Medications   HYDROcodone-acetaminophen (NORCO/VICODIN) 5-325 MG tablet    Sig: Take 1 tablet by mouth every 6 (six) hours as needed for moderate pain.    Dispense:  30 tablet    Refill:  0    I explained to her she can have 1 prescription and then needs pain management

## 2022-06-10 NOTE — Telephone Encounter (Signed)
The patient was seen today and Dr. Aline Brochure is referring the patient to Dr. Luna Glasgow.  The patient left w/out checking out.  I called the patient and she stated she was being referred to Dr. Luna Glasgow so he can refer her to pain management.  Please clarify that this is the reason for the referral.  Also, the patient has an appointment with Dr. Aline Brochure 07/03/22, does she need to keep this or cancel it?

## 2022-06-18 ENCOUNTER — Ambulatory Visit (INDEPENDENT_AMBULATORY_CARE_PROVIDER_SITE_OTHER): Payer: Medicaid Other | Admitting: Orthopaedic Surgery

## 2022-06-18 ENCOUNTER — Encounter: Payer: Self-pay | Admitting: Orthopaedic Surgery

## 2022-06-18 VITALS — BP 123/80 | HR 72 | Ht 61.0 in | Wt 136.0 lb

## 2022-06-18 DIAGNOSIS — G8929 Other chronic pain: Secondary | ICD-10-CM

## 2022-06-18 DIAGNOSIS — G894 Chronic pain syndrome: Secondary | ICD-10-CM | POA: Diagnosis not present

## 2022-06-18 DIAGNOSIS — M25561 Pain in right knee: Secondary | ICD-10-CM

## 2022-06-18 DIAGNOSIS — Z9889 Other specified postprocedural states: Secondary | ICD-10-CM

## 2022-06-18 NOTE — Progress Notes (Signed)
My knee hurts some.  She had knee surgery by Dr. Aline Brochure.  He feels she needs pain management.  He has asked for me to arrange.  Her knee is better.  Right knee has some bruising from prior surgery and slight effusion but good ROM, stable,  NV intact.  Encounter Diagnoses  Name Primary?   Chronic pain syndrome Yes   S/P right knee arthroscopy    Chronic pain of right knee    I will arrange for pain management.  I will see her in one month.  Call if any problem.  Precautions discussed.  Electronically Signed Sanjuana Kava, MD 2/6/20249:08 AM

## 2022-06-18 NOTE — Patient Instructions (Signed)
[facility] Address: [address], [place], [place] [address] Phone: [phone]  Call them to set up your appointment

## 2022-07-03 ENCOUNTER — Encounter: Payer: Medicaid Other | Admitting: Orthopedic Surgery

## 2022-07-04 ENCOUNTER — Encounter: Payer: Self-pay | Admitting: Orthopedic Surgery

## 2022-07-11 ENCOUNTER — Encounter: Payer: Self-pay | Admitting: Radiology

## 2022-07-16 ENCOUNTER — Ambulatory Visit: Payer: Medicaid Other | Admitting: Orthopaedic Surgery

## 2022-08-21 ENCOUNTER — Encounter: Payer: Self-pay | Admitting: Orthopaedic Surgery

## 2022-12-13 ENCOUNTER — Other Ambulatory Visit: Payer: Self-pay

## 2022-12-13 ENCOUNTER — Other Ambulatory Visit (INDEPENDENT_AMBULATORY_CARE_PROVIDER_SITE_OTHER)
Admission: EM | Admit: 2022-12-13 | Discharge: 2022-12-13 | Disposition: A | Discharge status: Home / Self Care | Payer: Medicaid Other | Source: Home / Self Care

## 2022-12-13 ENCOUNTER — Ambulatory Visit (HOSPITAL_COMMUNITY)
Admission: EM | Admit: 2022-12-13 | Discharge: 2022-12-13 | Payer: Medicaid Other | Attending: Behavioral Health | Admitting: Behavioral Health

## 2022-12-13 DIAGNOSIS — F1514 Other stimulant abuse with stimulant-induced mood disorder: Secondary | ICD-10-CM | POA: Insufficient documentation

## 2022-12-13 DIAGNOSIS — F1190 Opioid use, unspecified, uncomplicated: Secondary | ICD-10-CM

## 2022-12-13 DIAGNOSIS — F1590 Other stimulant use, unspecified, uncomplicated: Secondary | ICD-10-CM | POA: Insufficient documentation

## 2022-12-13 DIAGNOSIS — F319 Bipolar disorder, unspecified: Secondary | ICD-10-CM | POA: Insufficient documentation

## 2022-12-13 DIAGNOSIS — Z3202 Encounter for pregnancy test, result negative: Secondary | ICD-10-CM | POA: Diagnosis not present

## 2022-12-13 DIAGNOSIS — F1120 Opioid dependence, uncomplicated: Secondary | ICD-10-CM | POA: Insufficient documentation

## 2022-12-13 DIAGNOSIS — F419 Anxiety disorder, unspecified: Secondary | ICD-10-CM | POA: Insufficient documentation

## 2022-12-13 DIAGNOSIS — Z9151 Personal history of suicidal behavior: Secondary | ICD-10-CM | POA: Insufficient documentation

## 2022-12-13 DIAGNOSIS — J45909 Unspecified asthma, uncomplicated: Secondary | ICD-10-CM | POA: Insufficient documentation

## 2022-12-13 DIAGNOSIS — F1994 Other psychoactive substance use, unspecified with psychoactive substance-induced mood disorder: Secondary | ICD-10-CM | POA: Insufficient documentation

## 2022-12-13 LAB — POCT URINE DRUG SCREEN - MANUAL ENTRY (I-SCREEN)
POC Amphetamine UR: NOT DETECTED
POC Buprenorphine (BUP): NOT DETECTED
POC Cocaine UR: NOT DETECTED
POC Marijuana UR: POSITIVE — AB
POC Methadone UR: NOT DETECTED
POC Methamphetamine UR: NOT DETECTED
POC Morphine: NOT DETECTED
POC Oxazepam (BZO): NOT DETECTED
POC Oxycodone UR: NOT DETECTED
POC Secobarbital (BAR): NOT DETECTED

## 2022-12-13 LAB — COMPREHENSIVE METABOLIC PANEL
ALT: 11 U/L (ref 0–44)
AST: 30 U/L (ref 15–41)
Albumin: 3.6 g/dL (ref 3.5–5.0)
Alkaline Phosphatase: 81 U/L (ref 38–126)
Anion gap: 14 (ref 5–15)
BUN: 6 mg/dL (ref 6–20)
CO2: 22 mmol/L (ref 22–32)
Calcium: 8.6 mg/dL — ABNORMAL LOW (ref 8.9–10.3)
Chloride: 101 mmol/L (ref 98–111)
Creatinine, Ser: 0.81 mg/dL (ref 0.44–1.00)
GFR, Estimated: >60 mL/min (ref >60)
Glucose, Bld: 105 mg/dL — ABNORMAL HIGH (ref 70–99)
Potassium: 2.9 mmol/L — ABNORMAL LOW (ref 3.5–5.1)
Sodium: 137 mmol/L (ref 135–145)
Total Bilirubin: 0.4 mg/dL (ref 0.3–1.2)
Total Protein: 6.7 g/dL (ref 6.5–8.1)

## 2022-12-13 LAB — POC URINE PREG, ED: Preg Test, Ur: NEGATIVE

## 2022-12-13 LAB — HEPATITIS PANEL, ACUTE
HCV Ab: NONREACTIVE
Hep A IgM: NONREACTIVE
Hep B C IgM: NONREACTIVE
Hepatitis B Surface Ag: NONREACTIVE

## 2022-12-13 LAB — HEMOGLOBIN A1C
Hgb A1c MFr Bld: 5.2 % (ref 4.8–5.6)
Mean Plasma Glucose: 102.54 mg/dL

## 2022-12-13 LAB — CBC WITH DIFFERENTIAL/PLATELET
Abs Immature Granulocytes: 0.02 10*3/uL (ref 0.00–0.07)
Basophils Absolute: 0 10*3/uL (ref 0.0–0.1)
Basophils Relative: 0 %
Eosinophils Absolute: 0.2 10*3/uL (ref 0.0–0.5)
Eosinophils Relative: 3 %
HCT: 38.4 % (ref 36.0–46.0)
Hemoglobin: 12.9 g/dL (ref 12.0–15.0)
Immature Granulocytes: 0 %
Lymphocytes Relative: 25 %
Lymphs Abs: 2 10*3/uL (ref 0.7–4.0)
MCH: 29.3 pg (ref 26.0–34.0)
MCHC: 33.6 g/dL (ref 30.0–36.0)
MCV: 87.3 fL (ref 80.0–100.0)
Monocytes Absolute: 0.4 10*3/uL (ref 0.1–1.0)
Monocytes Relative: 5 %
Neutro Abs: 5.3 10*3/uL (ref 1.7–7.7)
Neutrophils Relative %: 67 %
Platelets: 159 10*3/uL (ref 150–400)
RBC: 4.4 MIL/uL (ref 3.87–5.11)
RDW: 16.5 % — ABNORMAL HIGH (ref 11.5–15.5)
WBC: 7.8 10*3/uL (ref 4.0–10.5)
nRBC: 0 % (ref 0.0–0.2)

## 2022-12-13 LAB — RAPID HIV SCREEN (HIV 1/2 AB+AG)
HIV 1/2 Antibodies: NONREACTIVE
HIV-1 P24 Antigen - HIV24: NONREACTIVE

## 2022-12-13 LAB — LIPID PANEL
Cholesterol: 154 mg/dL (ref 0–200)
HDL: 27 mg/dL — ABNORMAL LOW (ref >40)
LDL Cholesterol: 94 mg/dL (ref 0–99)
Total CHOL/HDL Ratio: 5.7 RATIO
Triglycerides: 167 mg/dL — ABNORMAL HIGH (ref <150)
VLDL: 33 mg/dL (ref 0–40)

## 2022-12-13 LAB — TSH: TSH: 1.436 u[IU]/mL (ref 0.350–4.500)

## 2022-12-13 LAB — MAGNESIUM: Magnesium: 1.8 mg/dL (ref 1.7–2.4)

## 2022-12-13 LAB — POCT PREGNANCY, URINE
Preg Test, Ur: NEGATIVE
Preg Test, Ur: NEGATIVE

## 2022-12-13 MED ORDER — DICYCLOMINE HCL 20 MG PO TABS
20.0000 mg | ORAL_TABLET | Freq: Four times a day (QID) | ORAL | Status: DC | PRN
  Administered 2022-12-13: 20 mg via ORAL
  Filled 2022-12-13: qty 1

## 2022-12-13 MED ORDER — LORAZEPAM 1 MG PO TABS: 1.0000 mg | ORAL_TABLET | Freq: Four times a day (QID) | ORAL | Status: DC | PRN

## 2022-12-13 MED ORDER — LORAZEPAM 2 MG/ML IJ SOLN: 1.0000 mg | Freq: Four times a day (QID) | INTRAMUSCULAR | Status: DC | PRN

## 2022-12-13 MED ORDER — METHOCARBAMOL 500 MG PO TABS
500.0000 mg | ORAL_TABLET | Freq: Three times a day (TID) | ORAL | Status: DC | PRN
  Administered 2022-12-13: 500 mg via ORAL
  Filled 2022-12-13: qty 1

## 2022-12-13 MED ORDER — CLONIDINE HCL 0.1 MG PO TABS
0.1000 mg | ORAL_TABLET | ORAL | Status: DC | PRN
  Administered 2022-12-13: 0.1 mg via ORAL
  Filled 2022-12-13: qty 1

## 2022-12-13 MED ORDER — HALOPERIDOL LACTATE 5 MG/ML IJ SOLN: 5.0000 mg | Freq: Four times a day (QID) | INTRAMUSCULAR | Status: DC | PRN

## 2022-12-13 MED ORDER — ACETAMINOPHEN 325 MG PO TABS: 650.0000 mg | ORAL_TABLET | Freq: Four times a day (QID) | ORAL | Status: DC | PRN

## 2022-12-13 MED ORDER — POLYETHYLENE GLYCOL 3350 17 G PO PACK: 17.0000 g | PACK | Freq: Every day | ORAL | Status: DC | PRN

## 2022-12-13 MED ORDER — DIPHENHYDRAMINE HCL 25 MG PO CAPS
25.0000 mg | ORAL_CAPSULE | Freq: Four times a day (QID) | ORAL | Status: DC | PRN
  Administered 2022-12-13: 25 mg via ORAL
  Filled 2022-12-13: qty 1

## 2022-12-13 MED ORDER — HALOPERIDOL 5 MG PO TABS: 5.0000 mg | ORAL_TABLET | Freq: Four times a day (QID) | ORAL | Status: DC | PRN

## 2022-12-13 MED ORDER — ALUM & MAG HYDROXIDE-SIMETH 200-200-20 MG/5ML PO SUSP: 30.0000 mL | ORAL | Status: DC | PRN

## 2022-12-13 MED ORDER — LOPERAMIDE HCL 2 MG PO CAPS: 2.0000 mg | ORAL_CAPSULE | ORAL | Status: DC | PRN

## 2022-12-13 MED ORDER — ONDANSETRON HCL 4 MG PO TABS
8.0000 mg | ORAL_TABLET | Freq: Three times a day (TID) | ORAL | Status: DC | PRN
  Administered 2022-12-13: 8 mg via ORAL
  Filled 2022-12-13: qty 2

## 2022-12-13 MED ORDER — BISMUTH SUBSALICYLATE 262 MG PO CHEW: 524.0000 mg | CHEWABLE_TABLET | ORAL | Status: DC | PRN

## 2022-12-13 MED ORDER — NAPROXEN 500 MG PO TABS
500.0000 mg | ORAL_TABLET | Freq: Two times a day (BID) | ORAL | Status: DC | PRN
  Administered 2022-12-13: 500 mg via ORAL
  Filled 2022-12-13: qty 1

## 2022-12-13 MED ORDER — DIPHENHYDRAMINE HCL 50 MG/ML IJ SOLN: 25.0000 mg | Freq: Four times a day (QID) | INTRAMUSCULAR | Status: DC | PRN

## 2022-12-13 MED ORDER — SENNA 8.6 MG PO TABS: 1.0000 | ORAL_TABLET | Freq: Every evening | ORAL | Status: DC | PRN

## 2022-12-13 NOTE — ED Notes (Signed)
Pt was escorted to [facility] and is currently awake and alert x4.  Pt has flat affect that brightens with engagement.  Mood appears congruent.  Pt was searched by Bess Harvest  RN and MHT. Currently denies SI, HI or AVH  pt also denies Withdrawal s/s.  Pt oriented to milieu and room.   Q57min checks intitated.

## 2022-12-13 NOTE — ED Provider Notes (Cosign Needed Addendum)
Behavioral Health Urgent Care Medical Screening Exam  Patient Name: Brittany Payne MRN: [id number] Date of Evaluation: 12/13/22 Chief Complaint:   Diagnosis:  Final diagnoses:  Opiate use  Substance induced mood disorder (HCC)    History of Present illness: Brittany Payne is a 32 y.o. female with a past psychiatric history significant for bipolar, depressed type, anxiety and past suicide attempts who presents to the [facility] behavioral health urgent care accompanied by her boyfriend of requesting the detox from fentanyl use.  Patient states that she started abusing street drugs in March after she stopped taking pain pills in February that was prescribed after knee surgery. She states that she started abusing fentanyl and methamphetamines in March 2024. She reports using fentanyl everyday for the past 6 months, on average a point or 3-4 shots via IV use. She reports last using fentanyl last night. She denies active opiate  withdrawal symptoms. She states that she stopped using methamphetamines last month after she got really sick and felt poisoned. She states that she was using methamphetamines everyday by sniffing or smoking, and on average she was spending $60 per day.  She reports occasional alcohol use, and states that she may have a drink every 7 to 8 months. She states that she last consumed alcohol, 2 margaritas the other day. She denies past substance abuse treatments. She states that she was recently pulled over by the police and caught with drugs and that her 3 kids were taken away from her and their father currently has custody. She states that she has an upcoming court date on December 24, 2022. She was referred to a substance abuse counselor Risk analyst) by her lawyer and the substance abuse assessment recommends she attends an inpatient substance abuse program. Patient denies outpatient psychiatry or therapy. She states that in the past she was  prescribed psychotropic medications but took herself off the medications in 2018. She was previously prescribed Seroquel and Wellbutrin. She reports past inpatient psychiatric hospitalizations as a teenager at [facility] and [facility]. She reports past suicide attempts by attempting to cut her arm, putting something around her neck, and overdosing on pills in 2018. She reports a psychiatric history of bipolar, depression, anxiety and self-injurious behaviors by cutting. She currently resides with her boyfriend and his mother. She denies access to firearms. She states that she has 4 children ages 29, 34, 52, and 58 years old. She reports a medical history of asthma. She denies taking prescribed or OTC medications. She denies physical complaints.    Flowsheet Row ED from 12/13/2022 in [facility] ED from 06/03/2022 in [facility] Emergency Department at [facility] Admission (Discharged) from 05/28/2022 in [facility]  C-SSRS RISK CATEGORY No Risk No Risk No Risk       Psychiatric Specialty Exam  Presentation  General Appearance:Disheveled  Eye Contact:Fair  Speech:Clear and Coherent  Speech Volume:Normal  Handedness:Right   Mood and Affect  Mood: Depressed  Affect: Congruent   Thought Process  Thought Processes: Coherent  Descriptions of Associations:Intact  Orientation:Full (Time, Place and Person)  Thought Content:Logical    Hallucinations:None  Ideas of Reference:None  Suicidal Thoughts:No  Homicidal Thoughts:No   Sensorium  Memory: Immediate Fair; Recent Fair; Remote Fair  Judgment: Fair  Insight: Fair   Art therapist  Concentration: Fair  Attention Span: Fair  Recall: [employer] of Knowledge: Fair  Language: Fair   Psychomotor Activity  Psychomotor Activity: Restlessness  Assets  Assets: Manufacturing systems engineer; Desire for Improvement; Housing; Intimacy; Leisure Time; Physical  Health   Sleep  Sleep: Poor  Number of hours:  4   Physical Exam: Physical Exam HENT:     Head: Normocephalic.     Nose: Nose normal.  Eyes:     Conjunctiva/sclera: Conjunctivae normal.  Cardiovascular:     Rate and Rhythm: Normal rate.  Pulmonary:     Effort: Pulmonary effort is normal.  Musculoskeletal:        General: Normal range of motion.     Cervical back: Normal range of motion.  Neurological:     Mental Status: She is alert and oriented to person, place, and time.    Review of Systems  Constitutional: Negative.   HENT: Negative.    Eyes: Negative.   Respiratory: Negative.    Cardiovascular: Negative.   Gastrointestinal: Negative.   Genitourinary: Negative.   Musculoskeletal: Negative.   Neurological: Negative.   Endo/Heme/Allergies: Negative.    Blood pressure (!) 128/96, pulse 95, temperature 98.5 F (36.9 C), temperature source Oral, resp. rate 19, SpO2 100%. There is no height or weight on file to calculate BMI.  Musculoskeletal: Strength & Muscle Tone: within normal limits Gait & Station: normal Patient leans: N/A   [facility] MSE Discharge Disposition for Follow up and Recommendations: Based on my evaluation I certify that psychiatric inpatient services furnished can reasonably be expected to improve the patient's condition which I recommend transfer to an appropriate accepting facility.   Patient to transfer to the [facility] Based Crisis for opiate detox.   Lab Orders         CBC with Differential/Platelet         Comprehensive metabolic panel         Hemoglobin A1c         Ethanol         Lipid panel         TSH         Hepatitis panel, acute         RPR         HIV Antibody (routine testing w rflx)         POC urine preg, ED         POCT Urine Drug Screen - (I-Screen)    EKG   ,  L, NP 12/13/2022, 11:23 AM

## 2022-12-13 NOTE — Discharge Planning (Signed)
LCSW went and spoke with patient on today. Patient was eating her lunch and reported that in this time she wants to take it "day by day". Patient reports she does not know what she wants to do. Patient then followed up and said she wanted to "continue with outpatient follow up". LCSW explored if patient is currently being seen by any providers and patient stated "no". Patient reports she has been struggling with fentanyl, ice, and CBD. Patient reports she lives at home with her boyfriend and his mother, and reports she has an upcoming court date on August 13th and reports she is not sure what is going to happen. LCSW did not probe regarding court date, however admission note reports "She states that she was recently pulled over by the police and caught with drugs and that her 3 kids were taken away from her and their father currently has custody. She states that she has an upcoming court date on December 24, 2022. She was referred to a substance abuse counselor Risk analyst) by her lawyer and the substance abuse assessment recommends she attends an inpatient substance abuse program". Patient did provide this Clinician with permission to send her clinicals out for review as needed. Per current circumstance, patient will likely not be accepted into a program until her legal issues have been resolved. LCSW sent clinicals to [facility] and the [facility] for review. Updates will be provided as received.   Fernande Boyden, LCSW Clinical Social Worker [facility] Ph: [phone]
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
## 2022-12-13 NOTE — ED Notes (Signed)
Spoke with patient regarding her wanting to leave. Patient is tearful and states she "can't sleep here." She wants help but she wants "outpatient resources and doesn't "want to do this."  Provider make aware

## 2022-12-13 NOTE — [place] Notes (Signed)
   12/13/22 1013  [facility] Triage Screening (Walk-ins at [facility] only)  How Did You Hear About [place]? Family/Friend  What Is the Reason for Your Visit/Call Today? Pt presents to [facility] with her boyfriend seeking detox for her ongoing addiction. Pt also is requesting mental health resources for her addiction problem. Pt states, "I am currently coming off of meth and fentanyl". Pt reports being diagnosed with Bipolar Disorder and Depression. Pt reports using "2 shots of fentanyl last night." Pt also reports not using meth since last month.  Pt states, "I am having "heart issues" after coming off of these drugs." Pt states tearfully," I do not want to be like this anymore, I have three kids that need me."Pt denies hx of alcohol, however she did drink 2 maragritas "the other day". Pt denies SI, HI, AVH.  How Long Has This Been Causing You Problems? <Week  Have You Recently Had Any Thoughts About Hurting Yourself? No  Are You Planning to Commit Suicide/Harm Yourself At This time? No  Have you Recently Had Thoughts About Hurting Someone Karolee Ohs? No  Are You Planning To Harm Someone At This Time? No  Are you currently experiencing any auditory, visual or other hallucinations? No  Have You Used Any Alcohol or Drugs in the Past 24 Hours? Yes  How long ago did you use Drugs or Alcohol? today  What Did You Use and How Much? 2 shots of fentanyl  Do you have any current medical co-morbidities that require immediate attention? No  Clinician description of patient physical appearance/behavior: anxious, fairly groomed, cooeraptive  What Do You Feel Would Help You the Most Today? Alcohol or Drug Use Treatment;Treatment for Depression or other mood problem  If access to [facility] was not available, would you have sought care in the Emergency Department? No  Determination of Need Urgent (48 hours)  Options For Referral Facility-Based Crisis;Medication Management

## 2022-12-13 NOTE — ED Notes (Signed)
Pt was provided lunch

## 2022-12-13 NOTE — ED Notes (Addendum)
Pt being tearful, when asked by the writer what is going on, stated "I don't want to be here, I don't to be inpatient, I think I can stop drugs by my self. Writer asked if she would consider IOP, pt responded yes. Writer asked her to think about what she wants to do so Clinical research associate can inform the provider on duty.

## 2022-12-13 NOTE — ED Notes (Signed)
Patient discharged to [facility] 

## 2022-12-13 NOTE — Group Note (Signed)
Group Topic: Recovery Basics  Group Date: 12/13/2022 Start Time: 1000 End Time: 1100 Facilitators: Londell Moh, NT  Department: [facility]  Number of Participants: 2  Group Focus: other  Treatment Modality:  Psychoeducation Interventions utilized were other  Purpose: n/a  Name: Brittany Payne Date of Birth: 27-Dec-1990  MR: [id number]    Level of Participation: Was unable to conduct group at this allotted group time due to patient being brought/admitted onto the unit. Patients Problems:  Patient Active Problem List   Diagnosis Date Noted   Opioid use disorder 12/13/2022   Substance induced mood disorder (HCC) 12/13/2022   Stimulant use disorder 12/13/2022   S/P right knee arthroscopy 05/28/22 DIAGNOSTIC/ NO TEARS/ NORMAL KNEE JOINT 06/04/2022   Right knee pain 05/28/2022   Umbilical hernia without obstruction and without gangrene 07/26/2019   Positive urine drug screen 12/31/2018   Anemia 02/05/2017   Bipolar disorder (HCC) 02/05/2017   Depression with anxiety 02/05/2017   Enlargement of spleen 02/05/2017   Migraine 02/05/2017

## 2022-12-13 NOTE — ED Notes (Signed)
Patient was discharged to [facility] from the observation unit. Patient denies SI/HI and AVH. Patient completed the admission assessment. Patient reported she was interested in discharging to outpatient services. Patient is being monitored for safety.

## 2022-12-13 NOTE — ED Notes (Signed)
Patient observed/assessed at bedside lying in bed asleep. Patient alert and oriented to self and location. Affect is flat. Patient denies pain and anxiety. He denies A/V/H. He denies having any thoughts/plan of self harm and harm towards others. Patient states that appetite has been good throughout the day. Verbalizes no further complaints at this time. Will continue to monitor and support.

## 2022-12-13 NOTE — Group Note (Unsigned)
Group Topic: Wellness  Group Date: 12/13/2022 Start Time: 1530 End Time: 1630 Facilitators: Blenda Nicely, RN  Department: [facility]  Number of Participants: 2 Group Focus: Goals and Spirituality  Treatment Modality:  Patient centered therapy Interventions utilized were support problem solving  Purpose: Express feelings.    Name: Brittany Payne Date of Birth: [birth year]  MR: [id number]    Level of Participation: {THERAPIES; PSYCH GROUP PARTICIPATION QMVHQ:46962} Quality of Participation: {THERAPIES; PSYCH QUALITY OF PARTICIPATION:23992} Interactions with others: {THERAPIES; PSYCH INTERACTIONS:23993} Mood/Affect: {THERAPIES; PSYCH MOOD/AFFECT:23994} Triggers (if applicable): *** Cognition: {THERAPIES; PSYCH COGNITION:23995} Progress: {THERAPIES; PSYCH PROGRESS:23997} Response: *** Plan: {THERAPIES; PSYCH XBMW:41324}  Patients Problems:  Patient Active Problem List   Diagnosis Date Noted   Opioid use disorder 12/13/2022   Substance induced mood disorder (HCC) 12/13/2022   Stimulant use disorder 12/13/2022   S/P right knee arthroscopy 05/28/22 DIAGNOSTIC/ NO TEARS/ NORMAL KNEE JOINT 06/04/2022   Right knee pain 05/28/2022   Umbilical hernia without obstruction and without gangrene 07/26/2019   Positive urine drug screen 12/31/2018   Anemia 02/05/2017   Bipolar disorder (HCC) 02/05/2017   Depression with anxiety 02/05/2017   Enlargement of spleen 02/05/2017   Migraine 02/05/2017

## 2022-12-13 NOTE — Progress Notes (Signed)
SPIRITUAL CARE AND COUNSELING GROUP NOTE  Spirituality group facilitated by Wilkie Aye, MDiv, BCC.  Group Description: Group focused on topic of hope. Patients participated in facilitated discussion around topic, connecting with one another around experiences and definitions for hope. Group engaged in discussion around how their definitions of hope are present today in hospital.  Modalities: Psycho-social ed, Adlerian, Narrative, MI  Patient Progress: Shadiyah was invited to group.  Did not attend.

## 2022-12-13 NOTE — ED Provider Notes (Cosign Needed Addendum)
Facility Based Crisis Admission H&P  Date: 12/13/22 Patient Name: Brittany Payne MRN: [id number] Chief Complaint: "detox from fentanyl"  Diagnoses:  Final diagnoses:  Opioid use disorder  Bipolar affective disorder, remission status unspecified (HCC)  Substance induced mood disorder (HCC)  Stimulant use disorder  Anxiety disorder, unspecified type   HPI:  Brittany Payne is a 32 y.o., female with a past psychiatric history of bipolar disorder, unspecified anxiety, and prior suicide attempts and substance use history of opioid use disorder and stimulant use disorder  who presents to the Facility Based Crisis center from behavioral health urgent care [facility]) for evaluation and management of fentanyl detox.  Patient says she is presenting for detoxification from fentanyl so she can do an outpatient substance rehabilitation program. She is not interested in inpatient rehab. She does not currently take any scheduled medications.  Patient could not stay focused enough for interview and says she is sleepy and tired. She also says her stomach "does not feel good." I informed patient we will speak again when she feels better.  Per [facility] note: Patient states that she started abusing street drugs in March after she stopped taking pain pills in February that was prescribed after knee surgery. She states that she started abusing fentanyl and methamphetamines in March 2024. She reports using fentanyl everyday for the past 6 months, on average a point or 3-4 shots via IV use. She reports last using fentanyl last night. She denies active opiate  withdrawal symptoms. She states that she stopped using methamphetamines last month after she got really sick and felt poisoned. She states that she was using methamphetamines everyday by sniffing or smoking, and on average she was spending $60 per day.  She reports occasional alcohol use, and states that she may have a drink every 7 to 8 months. She states that she last  consumed alcohol, 2 margaritas the other day. She denies past substance abuse treatments. She states that she was recently pulled over by the police and caught with drugs and that her 3 kids were taken away from her and their father currently has custody. She states that she has an upcoming court date on December 24, 2022. She was referred to a substance abuse counselor Risk analyst) by her lawyer and the substance abuse assessment recommends she attends an inpatient substance abuse program. Patient denies outpatient psychiatry or therapy. She states that in the past she was prescribed psychotropic medications but took herself off the medications in 2018. She was previously prescribed Seroquel and Wellbutrin. She reports past inpatient psychiatric hospitalizations as a teenager at [facility]. She reports past suicide attempts by attempting to cut her arm, putting something around her neck, and overdosing on pills in 2018. She reports a psychiatric history of bipolar, depression, anxiety and self-injurious behaviors by cutting. She currently resides with her boyfriend and his mother. She denies access to firearms. She states that she has 4 children ages 83, 91, 38, and 50 years old. She reports a medical history of asthma. She denies taking prescribed or OTC medications. She denies physical complaints.    PHQ 2-9:    Flowsheet Row ED from 12/13/2022 in [facility] Most recent reading at 12/13/2022  1:01 PM ED from 12/13/2022 in [facility] Most recent reading at 12/13/2022 10:28 AM ED from 06/03/2022 in [facility] Emergency Department at [facility] Most recent reading at 06/03/2022  2:33 AM  C-SSRS RISK CATEGORY  No Risk No Risk No Risk        Total Time spent with patient: 1 hour  Musculoskeletal  Strength & Muscle Tone: within normal limits Gait & Station: normal Patient leans: N/A  Psychiatric  Specialty Exam  Presentation General Appearance: Disheveled (somnolent)   Eye Contact:Fair   Speech:Slurred   Speech Volume:Normal   Handedness:Right   Mood and Affect  Mood:-- ("tired")   Affect:Depressed   Thought Process  Thought Processes:Coherent; Linear   Descriptions of Associations:Intact   Orientation:Full (Time, Place and Person)   Thought Content:Logical; WDL  Diagnosis of Schizophrenia or Schizoaffective disorder in past: No    Hallucinations:Hallucinations: None   Ideas of Reference:None   Suicidal Thoughts:Suicidal Thoughts: No   Homicidal Thoughts:Homicidal Thoughts: No   Sensorium  Memory:Immediate Fair; Recent Good; Remote Fair   Judgment:Good   Insight:Fair   Executive Functions  Concentration:Fair   Attention Span:Fair   Recall:Fair   Fund of Knowledge:Fair   Language:Fair   Psychomotor Activity  Psychomotor Activity:Psychomotor Activity: Normal   Assets  Assets:Resilience   Sleep  Sleep:Sleep: Poor Number of Hours of Sleep: 4   Nutritional Assessment (For OBS and FBC admissions only) Has the patient had a weight loss or gain of 10 pounds or more in the last 3 months?: No Has the patient had a decrease in food intake/or appetite?: Yes Does the patient have dental problems?: No Does the patient have eating habits or behaviors that may be indicators of an eating disorder including binging or inducing vomiting?: No Has the patient recently lost weight without trying?: 0 Has the patient been eating poorly because of a decreased appetite?: 1 Malnutrition Screening Tool Score: 1    Physical Exam Vitals and nursing note reviewed.  HENT:     Head: Normocephalic and atraumatic.  Pulmonary:     Effort: Pulmonary effort is normal.  Musculoskeletal:     Cervical back: Normal range of motion.  Neurological:     General: No focal deficit present.     Mental Status: She is alert. Mental status is at  baseline.    Review of Systems  Constitutional: Negative.   Respiratory: Negative.    Cardiovascular: Negative.   Gastrointestinal:        Discomfort  Genitourinary: Negative.   Musculoskeletal:  Positive for myalgias.   Blood pressure 130/83, pulse 68, temperature 98.2 F (36.8 C), temperature source Oral, resp. rate 18, SpO2 100%. There is no height or weight on file to calculate BMI.  Past Psychiatric History: bipolar disorder, anxiety   Is the patient at risk to self? No Has the patient been a risk to self in the past 6 months? No Has the patient been a risk to self within the distant past? Yes Is the patient a risk to others? No Has the patient been a risk to others in the past 6 months? No Has the patient been a risk to others within the distant past? No  Past Medical History: per [facility] note patient has asthma Family History: not obtained (patient experiencing withdrawals at this time) Social History: per [facility] note patient has been using fentanyl daily for past 6 months via IV, stopped using meth 1 month ago, and occasional alcohol use. Patient currently resides with her boyfriend and his mother. She denies access to firearms. She states that she has 4 children ages 61, 5, 60, and 32 years old.  Last Labs:     Latest Ref Rng & Units 05/27/2022    9:01  AM 04/11/2022    1:47 AM 04/07/2022   11:22 PM  CMP  Glucose 70 - 99 mg/dL 88  161  096   BUN 6 - 20 mg/dL 18  8  9    Creatinine 0.44 - 1.00 mg/dL 0.45  4.09  8.11   Sodium 135 - 145 mmol/L 137  139  138   Potassium 3.5 - 5.1 mmol/L 3.4  3.3  2.8   Chloride 98 - 111 mmol/L 104  102  107   CO2 22 - 32 mmol/L 22  25  24    Calcium 8.9 - 10.3 mg/dL 8.5  9.0  8.8   Total Protein 6.5 - 8.1 g/dL 6.8  8.7  7.0   Total Bilirubin 0.3 - 1.2 mg/dL 0.4  1.0  0.4   Alkaline Phos 38 - 126 U/L 62  77  65   AST 15 - 41 U/L 14  13  11    ALT 0 - 44 U/L 14  13  13    CBC    Component Value Date/Time   WBC 6.3 05/27/2022 0901   RBC 3.87  05/27/2022 0901   HGB 11.7 (L) 05/27/2022 0901   HCT 35.8 (L) 05/27/2022 0901   PLT 126 (L) 05/27/2022 0901   MCV 92.5 05/27/2022 0901   MCH 30.2 05/27/2022 0901   MCHC 32.7 05/27/2022 0901   RDW 15.4 05/27/2022 0901   LYMPHSABS 1.2 05/27/2022 0901   MONOABS 0.3 05/27/2022 0901   EOSABS 0.1 05/27/2022 0901   BASOSABS 0.0 05/27/2022 0901    Allergies: Yellow jacket venom, Amoxicillin, Celexa [citalopram], Penicillins, and Vistaril [hydroxyzine hcl]  PTA Medications: (Not in a hospital admission)   Long Term Goals: Improvement in symptoms so as ready for discharge  Short Term Goals: Patient will verbalize feelings in meetings with treatment team members. and Patient will take medications as prescribed daily.  Medical Decision Making  -- patient not currently taking any psychotropic medications. Can consider restarting bupropion and / or quetiapine if indicated -- COWS with as needed clonidine protocol. Can consider scheduled clonidine taper if withdrawal symptoms are severe -- ID screening for IVDU -- Patient does not need nicotine replacement  PRNs              -- start acetaminophen 650 mg every 6 hours as needed for mild to moderate pain, fever, and headaches              -- start bismuth subsalicylate 524 mg oral chewable tablet every 3 hours as needed for diarrhea / loose stools              -- start senna 8.6 mg oral at bedtime and polyethylene glycol 17 g oral daily as needed for mild to moderate constipation              -- start ondansetron 8 mg every 8 hours as needed for nausea or vomiting              -- start aluminum-magnesium hydroxide + simethicone 30 mL every 4 hours as needed for heartburn or indigestion -- Opiate withdrawal supportive care: as needed loperamide, naproxen, dicyclomine, methocarbamol, and clonidine  -- As needed agitation protocol in-place  Recommendations  Based on my evaluation the patient does not appear to have an emergency medical  condition.  Augusto Gamble, MD 12/13/22  2:33 PM

## 2022-12-13 NOTE — Discharge Instructions (Signed)
Transfer to [facility] for continued care

## 2022-12-13 NOTE — Discharge Instructions (Signed)
Substance Abuse Resources  [facility] - Admissions are currently completed Monday through Friday at [place]; both appointments and walk-ins are accepted.  Any individual that is a [facility] resident may present for a substance abuse screening and assessment for admission.  A person may be referred by numerous sources or self-refer.   Potential clients will be screened for medical necessity and appropriateness for the program.  Clients must meet criteria for high-intensity residential treatment services.  If clinically appropriate, a client will continue with the comprehensive clinical assessment and intake process, as well as enrollment in the [place].   Address: [address] [place], [place] [address] Admin Hours: Mon-Fri 8AM to [place] Hours: 24/7 Phone: [phone] Fax: [phone]   [facility]Det[facility]) Facility Based Crisis:  These are 3 locations for services: Please call before arrival    Address: [address] [place], [place] [address] Phone: [phone]   Address: [address] [place],  [place] [address] Phone#: [phone]   Address: [address] [place], [place] [address] Phone#: [phone]     [facility] ([facility]): (offers outpatient therapy and intensive outpatient substance abuse therapy).  [address] [place], [place] [address] Phone: [phone]   [place]: Offers FREE recovery skills classes, support groups, 1:1 Peer Support, and Compeer Classes. [address] [place], [place] [address] Phone: [phone] (Call to complete intake).  [facility] [address] [place], [place] [address] Phone: [phone] ext: [facility] provides food, shelter and other programs and services to the homeless men of [place] through our [employer].   By offering safe shelter, three meals a day,  clean clothing, Biblical counseling, financial planning, vocational training, GED/education and employment assistance, we've helped mend the shattered lives of many homeless men since opening in 1974.   We have approximately 267 beds available, with a max of 312 beds including mats for emergency situations and currently house an average of 270 men a night.   Prospective Client Check-In Information Photo ID Required (State/ Out of State/ [facility] [facility] [place]) - if photo ID is not available, clients are required to have a printout of a police/sheriff's criminal history report. Help out with chores around the [place]. No sex offender of any type (pending, charged, registered and/or any other sex related offenses) will be permitted to check in. Must be willing to abide by all rules, regulations, and policies established by the [employer]. The following will be provided - shelter, food, clothing, and biblical counseling. If you or someone you know is in need of assistance at our [facility] shelter in [place], [place], please call [phone] ext. [phone].   [facility]-will provide timely access to mental health services for children and adolescents (4-17) and adults presenting in a mental health crisis. The program is designed for those who need urgent Behavioral Health or Substance Use treatment and are not experiencing a medical crisis that would typically require an emergency room visit.    [place], [place] [address] Phone: [phone] [facility]: Phone#: [phone]   The [facility] SA Intensive Outpatient Program (SAIOP) means structured individual and group addiction activities and services that are provided at an outpatient program designed to assist adult and adolescent consumers to begin recovery and learn skills for recovery maintenance. The [place] program is offered at  least 3 hours a  day, 3 days a week.SAIOP services shall include a structured program consisting of, but not limited to, the following services: Individual counseling and support; Group counseling and support; Family counseling, training or support; Biochemical assays to identify recent drug use (e.g., urine drug screens); Strategies for relapse prevention to include community and social support systems in treatment; Life skills; Crisis contingency planning; Disease Management; and Treatment support activities that have been adapted or specifically designed for persons with physical disabilities, or persons with co-occurring disorders of mental illness and substance abuse/dependence or mental retardation/developmental disability and substance abuse/dependence. Phone: [place] [address] [place], [place] [address] Phone: [phone] Admissions team is available 24/7  Phone: [phone]. Fax: [phone]   [facility]     Admissions [address] [place], [place] [address] [phone]    [facility] [place]: [phone]  Behavioral Health Crisis Line: [phone]
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
## 2022-12-13 NOTE — ED Notes (Signed)
Pt was provided with dinner 

## 2022-12-14 NOTE — ED Provider Notes (Signed)
Notified by patient's nurse that patient wants to leave AMA. This NP presented to the unit and spoke to that patient at length about staying. Patient continues to refusing and requesting to leave. Patient says she only wants outpatient services and did not wish to be admitted. She says she is unable to sleep in stranger environment. NP offered patient sleep medication and informed her she may have medication for anxiety as need but patient continues to request to leave AMA. This NP explained to patient that her symptom may worsen due to inadequate treatment of psychiatric problems prior to self discharge. Patient informed that she may return to [facility] or go to nearest ED if symptoms worsens.   No evidence of imminent danger to self or others at this time. Patient does not meet criteria for psychiatric admission or IVC. Supportive therapy provided about ongoing stressors. Discussed crisis plan, callling [phone] or going to Emergency Dept

## 2022-12-17 ENCOUNTER — Ambulatory Visit (HOSPITAL_COMMUNITY)
Admission: EM | Admit: 2022-12-17 | Discharge: 2022-12-17 | Disposition: A | Discharge status: Home / Self Care | Payer: Medicaid Other | Attending: Behavioral Health | Admitting: Behavioral Health

## 2022-12-17 ENCOUNTER — Other Ambulatory Visit (HOSPITAL_COMMUNITY)
Admission: EM | Admit: 2022-12-17 | Discharge: 2022-12-19 | Disposition: A | Discharge status: Home / Self Care | Payer: Medicaid Other | Source: Home / Self Care | Admitting: Behavioral Health

## 2022-12-17 ENCOUNTER — Encounter (HOSPITAL_COMMUNITY): Payer: Self-pay | Admitting: Behavioral Health

## 2022-12-17 DIAGNOSIS — F419 Anxiety disorder, unspecified: Secondary | ICD-10-CM | POA: Insufficient documentation

## 2022-12-17 DIAGNOSIS — F1123 Opioid dependence with withdrawal: Secondary | ICD-10-CM | POA: Insufficient documentation

## 2022-12-17 DIAGNOSIS — F1124 Opioid dependence with opioid-induced mood disorder: Secondary | ICD-10-CM | POA: Insufficient documentation

## 2022-12-17 DIAGNOSIS — Z653 Problems related to other legal circumstances: Secondary | ICD-10-CM | POA: Insufficient documentation

## 2022-12-17 DIAGNOSIS — F1994 Other psychoactive substance use, unspecified with psychoactive substance-induced mood disorder: Secondary | ICD-10-CM

## 2022-12-17 DIAGNOSIS — R4588 Nonsuicidal self-harm: Secondary | ICD-10-CM | POA: Insufficient documentation

## 2022-12-17 DIAGNOSIS — F1914 Other psychoactive substance abuse with psychoactive substance-induced mood disorder: Secondary | ICD-10-CM | POA: Insufficient documentation

## 2022-12-17 DIAGNOSIS — R45851 Suicidal ideations: Secondary | ICD-10-CM | POA: Diagnosis not present

## 2022-12-17 DIAGNOSIS — F1193 Opioid use, unspecified with withdrawal: Secondary | ICD-10-CM

## 2022-12-17 DIAGNOSIS — F17210 Nicotine dependence, cigarettes, uncomplicated: Secondary | ICD-10-CM | POA: Insufficient documentation

## 2022-12-17 DIAGNOSIS — F1910 Other psychoactive substance abuse, uncomplicated: Secondary | ICD-10-CM | POA: Diagnosis present

## 2022-12-17 LAB — CBC WITH DIFFERENTIAL/PLATELET
Abs Immature Granulocytes: 0.02 10*3/uL (ref 0.00–0.07)
Basophils Absolute: 0 10*3/uL (ref 0.0–0.1)
Basophils Relative: 0 %
Eosinophils Absolute: 0.2 10*3/uL (ref 0.0–0.5)
Eosinophils Relative: 2 %
HCT: 41.6 % (ref 36.0–46.0)
Hemoglobin: 13.6 g/dL (ref 12.0–15.0)
Immature Granulocytes: 0 %
Lymphocytes Relative: 22 %
Lymphs Abs: 1.6 10*3/uL (ref 0.7–4.0)
MCH: 28.9 pg (ref 26.0–34.0)
MCHC: 32.7 g/dL (ref 30.0–36.0)
MCV: 88.3 fL (ref 80.0–100.0)
Monocytes Absolute: 0.4 10*3/uL (ref 0.1–1.0)
Monocytes Relative: 5 %
Neutro Abs: 5.1 10*3/uL (ref 1.7–7.7)
Neutrophils Relative %: 71 %
Platelets: 160 10*3/uL (ref 150–400)
RBC: 4.71 MIL/uL (ref 3.87–5.11)
RDW: 16 % — ABNORMAL HIGH (ref 11.5–15.5)
WBC: 7.3 10*3/uL (ref 4.0–10.5)
nRBC: 0 % (ref 0.0–0.2)

## 2022-12-17 LAB — LIPID PANEL
Cholesterol: 209 mg/dL — ABNORMAL HIGH (ref 0–200)
HDL: 36 mg/dL — ABNORMAL LOW (ref >40)
LDL Cholesterol: 131 mg/dL — ABNORMAL HIGH (ref 0–99)
Total CHOL/HDL Ratio: 5.8 RATIO
Triglycerides: 208 mg/dL — ABNORMAL HIGH (ref <150)
VLDL: 42 mg/dL — ABNORMAL HIGH (ref 0–40)

## 2022-12-17 LAB — COMPREHENSIVE METABOLIC PANEL
ALT: 10 U/L (ref 0–44)
AST: 11 U/L — ABNORMAL LOW (ref 15–41)
Albumin: 4.1 g/dL (ref 3.5–5.0)
Alkaline Phosphatase: 82 U/L (ref 38–126)
Anion gap: 12 (ref 5–15)
BUN: <5 mg/dL — ABNORMAL LOW (ref 6–20)
CO2: 27 mmol/L (ref 22–32)
Calcium: 9 mg/dL (ref 8.9–10.3)
Chloride: 100 mmol/L (ref 98–111)
Creatinine, Ser: 0.55 mg/dL (ref 0.44–1.00)
GFR, Estimated: >60 mL/min (ref >60)
Glucose, Bld: 74 mg/dL (ref 70–99)
Potassium: 3.2 mmol/L — ABNORMAL LOW (ref 3.5–5.1)
Sodium: 139 mmol/L (ref 135–145)
Total Bilirubin: 0.9 mg/dL (ref 0.3–1.2)
Total Protein: 7.4 g/dL (ref 6.5–8.1)

## 2022-12-17 LAB — POCT URINE DRUG SCREEN - MANUAL ENTRY (I-SCREEN)
POC Amphetamine UR: NOT DETECTED
POC Amphetamine UR: NOT DETECTED
POC Buprenorphine (BUP): NOT DETECTED
POC Buprenorphine (BUP): NOT DETECTED
POC Cocaine UR: NOT DETECTED
POC Cocaine UR: NOT DETECTED
POC Marijuana UR: POSITIVE — AB
POC Marijuana UR: POSITIVE — AB
POC Methadone UR: NOT DETECTED
POC Methadone UR: NOT DETECTED
POC Methamphetamine UR: NOT DETECTED
POC Methamphetamine UR: NOT DETECTED
POC Morphine: NOT DETECTED
POC Morphine: NOT DETECTED
POC Oxazepam (BZO): NOT DETECTED
POC Oxazepam (BZO): NOT DETECTED
POC Oxycodone UR: NOT DETECTED
POC Oxycodone UR: NOT DETECTED
POC Secobarbital (BAR): NOT DETECTED
POC Secobarbital (BAR): NOT DETECTED

## 2022-12-17 LAB — ETHANOL: Alcohol, Ethyl (B): <10 mg/dL (ref <10)

## 2022-12-17 LAB — HEMOGLOBIN A1C
Hgb A1c MFr Bld: 5 % (ref 4.8–5.6)
Mean Plasma Glucose: 96.8 mg/dL

## 2022-12-17 LAB — MAGNESIUM: Magnesium: 2.1 mg/dL (ref 1.7–2.4)

## 2022-12-17 LAB — POC URINE PREG, ED
Preg Test, Ur: NEGATIVE
Preg Test, Ur: NEGATIVE

## 2022-12-17 LAB — TSH: TSH: 0.98 u[IU]/mL (ref 0.350–4.500)

## 2022-12-17 MED ORDER — CLONIDINE HCL 0.1 MG PO TABS
0.1000 mg | ORAL_TABLET | Freq: Four times a day (QID) | ORAL | Status: DC
  Administered 2022-12-17 – 2022-12-18 (×2): 0.1 mg via ORAL
  Filled 2022-12-17 (×2): qty 1

## 2022-12-17 MED ORDER — METHOCARBAMOL 500 MG PO TABS: 500.0000 mg | ORAL_TABLET | Freq: Three times a day (TID) | ORAL | Status: DC | PRN

## 2022-12-17 MED ORDER — ALUM & MAG HYDROXIDE-SIMETH 200-200-20 MG/5ML PO SUSP: 30.0000 mL | ORAL | Status: DC | PRN

## 2022-12-17 MED ORDER — DICYCLOMINE HCL 20 MG PO TABS: 20.0000 mg | ORAL_TABLET | Freq: Four times a day (QID) | ORAL | Status: DC | PRN

## 2022-12-17 MED ORDER — MAGNESIUM HYDROXIDE 400 MG/5ML PO SUSP: 30.0000 mL | Freq: Every day | ORAL | Status: DC | PRN

## 2022-12-17 MED ORDER — ACETAMINOPHEN 325 MG PO TABS: 650.0000 mg | ORAL_TABLET | Freq: Four times a day (QID) | ORAL | Status: DC | PRN

## 2022-12-17 MED ORDER — CLONIDINE HCL 0.1 MG PO TABS: 0.1000 mg | ORAL_TABLET | Freq: Every day | ORAL | Status: DC

## 2022-12-17 MED ORDER — ONDANSETRON 4 MG PO TBDP
4.0000 mg | ORAL_TABLET | Freq: Four times a day (QID) | ORAL | Status: DC | PRN
  Administered 2022-12-17: 4 mg via ORAL
  Filled 2022-12-17: qty 1

## 2022-12-17 MED ORDER — NAPROXEN 500 MG PO TABS: 500.0000 mg | ORAL_TABLET | Freq: Two times a day (BID) | ORAL | Status: DC | PRN

## 2022-12-17 MED ORDER — LOPERAMIDE HCL 2 MG PO CAPS: 2.0000 mg | ORAL_CAPSULE | ORAL | Status: DC | PRN

## 2022-12-17 MED ORDER — CLONIDINE HCL 0.1 MG PO TABS: 0.1000 mg | ORAL_TABLET | ORAL | Status: DC

## 2022-12-17 MED ORDER — TRAZODONE HCL 50 MG PO TABS: 50.0000 mg | ORAL_TABLET | Freq: Every evening | ORAL | Status: DC | PRN

## 2022-12-17 MED ORDER — CLONIDINE HCL 0.1 MG PO TABS: 0.1000 mg | ORAL_TABLET | Freq: Four times a day (QID) | ORAL | Status: DC

## 2022-12-17 MED ORDER — METHOCARBAMOL 500 MG PO TABS
500.0000 mg | ORAL_TABLET | Freq: Three times a day (TID) | ORAL | Status: DC | PRN
  Administered 2022-12-17 – 2022-12-18 (×2): 500 mg via ORAL
  Filled 2022-12-17 (×2): qty 1

## 2022-12-17 MED ORDER — ONDANSETRON 4 MG PO TBDP: 4.0000 mg | ORAL_TABLET | Freq: Four times a day (QID) | ORAL | Status: DC | PRN

## 2022-12-17 MED ORDER — NAPROXEN 500 MG PO TABS
500.0000 mg | ORAL_TABLET | Freq: Two times a day (BID) | ORAL | Status: DC | PRN
  Administered 2022-12-17 – 2022-12-18 (×2): 500 mg via ORAL
  Filled 2022-12-17 (×2): qty 1

## 2022-12-17 MED ORDER — TRAZODONE HCL 50 MG PO TABS
50.0000 mg | ORAL_TABLET | Freq: Every evening | ORAL | Status: DC | PRN
  Administered 2022-12-17: 50 mg via ORAL
  Filled 2022-12-17: qty 1

## 2022-12-17 NOTE — ED Provider Notes (Cosign Needed Addendum)
Behavioral Health Urgent Care Medical Screening Exam  Patient Name: Brittany Payne MRN: [id number] Date of Evaluation: 12/17/22 Chief Complaint:  "I need to detox" Diagnosis:  Final diagnoses:  Substance induced mood disorder (HCC)  Polysubstance abuse (HCC)   History of Present Illness: Brittany Payne is a 32 y.o. female patient with a past psychiatric history of opioid use disorder, substance-induced mood disorder, bipolar disorder, stimulant use disorder, and anxiety who presented to [facility] voluntarily and unaccompanied seeking fentanyl detox.  Patient assessed face-to-face by this provider and chart reviewed on 12/17/22. On evaluation, Brittany Payne is seated in assessment area in no acute distress. Patient is alert and oriented x4, pleasant and cooperative. Patient appears disheveled. Speech is clear and coherent, normal rate and volume. Eye contact is minimal. Mood is depressed with tearful and congruent affect. Thought process is coherent with thought content that consists of passive suicidal ideations. Patient reports since relapsing on fentanyl yesterday, she has had passive suicidal ideations that "I don't wanna be here" with no plan or intent. Patient reports increased symptoms of depression related to her substance use. Patient denies homicidal ideations. Patient reports a history of 1 past suicide attempt by cutting her wrists when she was 72 or 32 y/o. Patient denies a history of self-harm. Patient reports past psychiatric hospitalizations at [facility] and [facility] when she was a teenager. Patient reports she was recently at [facility] for detox a few days ago. Per chart review, patient presented to [facility] on 12/13/22 seeking fentanyl detox and was admitted to [facility]. Patient then left AMA the same night stating she only wanted outpatient services, did not wish to be admitted, and was unable to sleep in a strange environment. Patient states "I shouldn't have done that, I thought I had it  together, I went to see my kids and that about killed me, I need to do this." Patient denies auditory and visual hallucinations. Patient denies symptoms of paranoia. Patient is able to converse coherently with goal-directed thoughts and no distractibility or preoccupation. Objectively, there is no evidence of psychosis/mania, delusional thinking, or indication that patient is responding to internal or external stimuli.  Patient reports good sleep (8 hours/night) and good appetite. Patient states she lives with her boyfriend and boyfriend's mother and denies access to weapons/firearms. Patient states she is unemployed. Patient reports she has 4 children, her oldest child lives with her sister and the 3 other children live with their father. Patient reports current legal issues stating "we got pulled over, they found drugs in the car, my baby was with [place] and they took him, I don't have custody." Patient reports she had knee surgery in February 2024 and "the pain meds weren't working," so she began using fentanyl that was offered by a friend. Patient states she was "sniffing" the fentanyl but began using it intravenously in April 2024. Patient reports she has stopped using it intravenously and currently only sniffs it. Patient reports prior to being at [facility], she was using fentanyl daily. Patient reports she did not use any substances for 3 days after leaving [facility] but relapsed on fentanyl yesterday because she was triggered by seeing her children. Patient reports last fentanyl use was "$20 worth" yesterday. Patient reports daily CBD use by vape pen, last use today. Patient denies use of alcohol or other illicit substances. Patient reports a history of methamphetamine, cocaine, and marijuana use. Patient denies a history of seizures/DTs related to withdrawal. Patient reports current withdrawal symptoms of nausea/vomiting  and a lower back pain. Patient reports her boyfriend and boyfriend's mother are supportive but  "my friends are bad influences." Patient states she does not currently take any medications and does not have outpatient psychiatric services in place for therapy/medication management/substance use treatment. Patient states she is interested in residential substance use treatment after detox.   Patient offered support and encouragement. Discussed options for admission to [facility] for detox/substance use treatment vs. outpatient substance use treatment. Patient is requesting admission to [facility] stating "I have to do this, I'm not leaving this time." Discussed with patient starting COWS protocol with clonidine taper. Discussed with patient PRN medications (Tylenol, Bentyl, Maalox, Imodium, Milk of Magnesia, Robaxin, naproxen, Zofran, and trazodone) that will be available for withdrawal supportive care. Patient is in agreement with plan of care.   Flowsheet Row ED from 12/17/2022 in [facility] Most recent reading at 12/17/2022  6:19 PM ED from 12/13/2022 in [facility] Most recent reading at 12/13/2022  1:01 PM ED from 12/13/2022 in [facility] Most recent reading at 12/13/2022 10:28 AM  C-SSRS RISK CATEGORY Low Risk No Risk No Risk       Psychiatric Specialty Exam  Presentation  General Appearance:Disheveled  Eye Contact:Minimal  Speech:Clear and Coherent; Normal Rate  Speech Volume:Normal  Handedness:Right   Mood and Affect  Mood: Depressed  Affect: Tearful; Depressed   Thought Process  Thought Processes: Coherent; Goal Directed  Descriptions of Associations:Intact  Orientation:Full (Time, Place and Person)  Thought Content:Logical  Diagnosis of Schizophrenia or Schizoaffective disorder in past: No   Hallucinations:None  Ideas of Reference:None  Suicidal Thoughts:Yes, Passive Without Intent; Without Plan; Without Means to Carry Out; Without Access to Means  Homicidal  Thoughts:No   Sensorium  Memory: Immediate Fair; Recent Fair; Remote Fair  Judgment: Fair  Insight: Fair   Art therapist  Concentration: Fair  Attention Span: Fair  Recall: [employer] of Knowledge: Fair  Language: Fair   Psychomotor Activity  Psychomotor Activity: Normal   Assets  Assets: Manufacturing systems engineer; Desire for Improvement; Financial Resources/Insurance; Housing; Leisure Time; Physical Health; Resilience; Social Support   Sleep  Sleep: Good  Number of hours:  8   Physical Exam: Physical Exam Vitals and nursing note reviewed.  Constitutional:      General: She is not in acute distress.    Appearance: Normal appearance. She is not ill-appearing.  HENT:     Head: Normocephalic and atraumatic.     Nose: Nose normal.  Eyes:     General:        Right eye: No discharge.        Left eye: No discharge.     Conjunctiva/sclera: Conjunctivae normal.  Cardiovascular:     Rate and Rhythm: Normal rate.  Pulmonary:     Effort: Pulmonary effort is normal. No respiratory distress.  Musculoskeletal:        General: Normal range of motion.     Cervical back: Normal range of motion.  Skin:    General: Skin is warm and dry.  Neurological:     General: No focal deficit present.     Mental Status: She is alert and oriented to person, place, and time. Mental status is at baseline.  Psychiatric:        Attention and Perception: Attention and perception normal.        Mood and Affect: Mood is depressed. Affect is tearful.  Speech: Speech normal.        Behavior: Behavior normal. Behavior is cooperative.        Thought Content: Thought content is not paranoid or delusional. Thought content includes suicidal (Passive, no plan/intent) ideation. Thought content does not include homicidal ideation. Thought content does not include homicidal or suicidal plan.        Cognition and Memory: Cognition and memory normal.        Judgment: Judgment  normal.    Review of Systems  Constitutional: Negative.   HENT: Negative.    Eyes: Negative.   Respiratory: Negative.    Cardiovascular: Negative.   Gastrointestinal: Negative.   Genitourinary: Negative.   Musculoskeletal: Negative.   Skin: Negative.   Neurological: Negative.   Endo/Heme/Allergies: Negative.   Psychiatric/Behavioral:  Positive for depression, substance abuse and suicidal ideas (Passive, no plan/intent). Negative for hallucinations and memory loss. The patient is not nervous/anxious and does not have insomnia.    Blood pressure (!) 142/91, pulse 79, temperature 98.2 F (36.8 C), temperature source Oral, resp. rate 18, SpO2 100%. There is no height or weight on file to calculate BMI.  Musculoskeletal: Strength & Muscle Tone: within normal limits Gait & Station: normal Patient leans: N/A  Flowsheet Row ED from 12/17/2022 in [facility]  PHQ-9 Total Score 11       [facility] MSE Disposition and Recommendations: Based on my evaluation I certify that psychiatric inpatient services furnished can reasonably be expected to improve the patient's condition which I recommend transfer to an appropriate accepting facility.   Brittany Payne will be discharged and readmitted to [facility] Facility-based Crisis Unit under the service of Dr. Nelly Rout for substance-induced mood disorder, Polysubstance abuse (HCC), substance use detox, crisis management, and stabilization. Patient remains voluntary.  Routine labs ordered, which include Lab Orders         CBC with Differential/Platelet         Comprehensive metabolic panel         Hemoglobin A1c         Magnesium         Ethanol         Lipid panel         TSH         Prolactin         POC urine preg, ED         POCT Urine Drug Screen - (I-Screen)    -EKG Medication Management: Medications started Meds ordered this encounter  Medications   acetaminophen (TYLENOL)  tablet 650 mg   alum & mag hydroxide-simeth (MAALOX/MYLANTA) 200-200-20 MG/5ML suspension 30 mL   magnesium hydroxide (MILK OF MAGNESIA) suspension 30 mL   dicyclomine (BENTYL) tablet 20 mg   loperamide (IMODIUM) capsule 2-4 mg   methocarbamol (ROBAXIN) tablet 500 mg   naproxen (NAPROSYN) tablet 500 mg   ondansetron (ZOFRAN-ODT) disintegrating tablet 4 mg   traZODone (DESYREL) tablet 50 mg   FOLLOWED BY Linked Order Group    cloNIDine (CATAPRES) tablet 0.1 mg    cloNIDine (CATAPRES) tablet 0.1 mg    cloNIDine (CATAPRES) tablet 0.1 mg    Will maintain observation checks every 15 minutes for safety. Psychosocial education regarding relapse prevention and self-care; social and communication  Social work will consult with family for collateral information and discuss discharge and follow up plan.   Sunday Corn, NP 12/17/2022, 7:26 PM
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
## 2022-12-17 NOTE — ED Notes (Signed)
Pt sleeping@this time. Breathing even and unlabored. Will continue to monitor for safety 

## 2022-12-17 NOTE — ED Notes (Signed)
Pt is A & O x 4. Pt was tearful and stated "I am back again, I know you will say I told to stay and get help" the moment pt saw Clinical research associate.Admission process completed.Pt is oriented to the unit and provided with meal. Medication has been administered to pt and pt is in her room. Pt denies SI/HI/AVH. Will continue to monitor for safety and provide support.

## 2022-12-17 NOTE — Progress Notes (Signed)
   12/17/22 1759  [facility]UC Triage Screening (Walk-ins at [facility] only)  How Did You Hear About [place]? Self  What Is the Reason for Your Visit/Call Today? Pt presents to [facility] unaccompanied seeking detox for her ongoing addiction. Pt has a hx of opioid use disorder and Bipolar I disorder. 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 She reports remaining sober for 3 days and relapsing yesterday. Her last use was yesterday $20 worth of fentanyl, pt states she was triggered after a visit with her children. Pt reports she has 4 children, her oldest child lives with her sister and the other 3 children live with their father. She reports she is living with her boyfriend and his mother.Pt reports passive SI stating "I don't wanna be here". She reports withdrawal symptoms today, lower back pain, nausea, vomiting. She reports 1 prior suicide attempt at age 65 where she attempted to cut her wrist and was hospitalized for over a month at [facility]. Pt denies any plans or intent to harm herself. Pt denies HI and AVH.  How Long Has This Been Causing You Problems? > than 6 months  Have You Recently Had Any Thoughts About Hurting Yourself? Yes  How long ago did you have thoughts about hurting yourself? passive  Are You Planning to Commit Suicide/Harm Yourself At This time? No  Have you Recently Had Thoughts About Hurting Someone Karolee Ohs? No  Are You Planning To Harm Someone At This Time? No  Are you currently experiencing any auditory, visual or other hallucinations? No  Have You Used Any Alcohol or Drugs in the Past 24 Hours? Yes  How long ago did you use Drugs or Alcohol? yesterday  What Did You Use and How Much? $20 worth of fentanyl  Do you have any current medical co-morbidities that require immediate attention? No  Clinician description of patient physical appearance/behavior: tearful, cooperative  What Do You Feel Would Help You the Most Today? Alcohol or  Drug Use Treatment  If access to [facility] was not available, would you have sought care in the Emergency Department? No  Determination of Need Urgent (48 hours)  Options For Referral Facility-Based Crisis

## 2022-12-18 DIAGNOSIS — F1914 Other psychoactive substance abuse with psychoactive substance-induced mood disorder: Secondary | ICD-10-CM | POA: Diagnosis not present

## 2022-12-18 DIAGNOSIS — F1124 Opioid dependence with opioid-induced mood disorder: Secondary | ICD-10-CM | POA: Diagnosis not present

## 2022-12-18 DIAGNOSIS — R45851 Suicidal ideations: Secondary | ICD-10-CM | POA: Diagnosis not present

## 2022-12-18 DIAGNOSIS — F1123 Opioid dependence with withdrawal: Secondary | ICD-10-CM | POA: Diagnosis not present

## 2022-12-18 MED ORDER — LORAZEPAM 2 MG/ML IJ SOLN: 1.0000 mg | Freq: Four times a day (QID) | INTRAMUSCULAR | Status: DC | PRN

## 2022-12-18 MED ORDER — MELATONIN 3 MG PO TABS
3.0000 mg | ORAL_TABLET | Freq: Every evening | ORAL | Status: DC | PRN
  Administered 2022-12-18: 3 mg via ORAL
  Filled 2022-12-18: qty 1

## 2022-12-18 MED ORDER — HALOPERIDOL 5 MG PO TABS: 5.0000 mg | ORAL_TABLET | Freq: Four times a day (QID) | ORAL | Status: DC | PRN

## 2022-12-18 MED ORDER — POTASSIUM CHLORIDE CRYS ER 20 MEQ PO TBCR
40.0000 meq | EXTENDED_RELEASE_TABLET | Freq: Once | ORAL | Status: AC
  Administered 2022-12-18: 40 meq via ORAL
  Filled 2022-12-18: qty 2

## 2022-12-18 MED ORDER — LOPERAMIDE HCL 2 MG PO CAPS: 2.0000 mg | ORAL_CAPSULE | ORAL | Status: DC | PRN

## 2022-12-18 MED ORDER — DICYCLOMINE HCL 20 MG PO TABS
20.0000 mg | ORAL_TABLET | Freq: Four times a day (QID) | ORAL | Status: DC | PRN
  Administered 2022-12-18: 20 mg via ORAL
  Filled 2022-12-18: qty 1

## 2022-12-18 MED ORDER — HALOPERIDOL LACTATE 5 MG/ML IJ SOLN: 5.0000 mg | Freq: Four times a day (QID) | INTRAMUSCULAR | Status: DC | PRN

## 2022-12-18 MED ORDER — ALUM & MAG HYDROXIDE-SIMETH 200-200-20 MG/5ML PO SUSP: 30.0000 mL | ORAL | Status: DC | PRN

## 2022-12-18 MED ORDER — SENNA 8.6 MG PO TABS: 1.0000 | ORAL_TABLET | Freq: Every evening | ORAL | Status: DC | PRN

## 2022-12-18 MED ORDER — DIPHENHYDRAMINE HCL 25 MG PO CAPS: 25.0000 mg | ORAL_CAPSULE | Freq: Four times a day (QID) | ORAL | Status: DC | PRN

## 2022-12-18 MED ORDER — POLYETHYLENE GLYCOL 3350 17 G PO PACK: 17.0000 g | PACK | Freq: Every day | ORAL | Status: DC | PRN

## 2022-12-18 MED ORDER — NAPROXEN 500 MG PO TABS
500.0000 mg | ORAL_TABLET | Freq: Two times a day (BID) | ORAL | Status: DC | PRN
  Administered 2022-12-18: 500 mg via ORAL
  Filled 2022-12-18: qty 1

## 2022-12-18 MED ORDER — CLONIDINE HCL 0.1 MG PO TABS: 0.1000 mg | ORAL_TABLET | ORAL | Status: DC | PRN

## 2022-12-18 MED ORDER — BISMUTH SUBSALICYLATE 262 MG PO CHEW: 524.0000 mg | CHEWABLE_TABLET | ORAL | Status: DC | PRN

## 2022-12-18 MED ORDER — POTASSIUM CHLORIDE 20 MEQ PO PACK: 40.0000 meq | PACK | Freq: Once | ORAL | Status: AC

## 2022-12-18 MED ORDER — METHOCARBAMOL 500 MG PO TABS
500.0000 mg | ORAL_TABLET | Freq: Three times a day (TID) | ORAL | Status: DC | PRN
  Administered 2022-12-18: 500 mg via ORAL
  Filled 2022-12-18: qty 1

## 2022-12-18 MED ORDER — DIPHENHYDRAMINE HCL 50 MG/ML IJ SOLN: 25.0000 mg | Freq: Four times a day (QID) | INTRAMUSCULAR | Status: DC | PRN

## 2022-12-18 MED ORDER — LORAZEPAM 1 MG PO TABS: 1.0000 mg | ORAL_TABLET | Freq: Four times a day (QID) | ORAL | Status: DC | PRN

## 2022-12-18 MED ORDER — ONDANSETRON HCL 4 MG PO TABS: 8.0000 mg | ORAL_TABLET | Freq: Three times a day (TID) | ORAL | Status: DC | PRN

## 2022-12-18 MED ORDER — ACETAMINOPHEN 325 MG PO TABS
650.0000 mg | ORAL_TABLET | Freq: Four times a day (QID) | ORAL | Status: DC | PRN
  Administered 2022-12-18: 650 mg via ORAL
  Filled 2022-12-18: qty 2

## 2022-12-18 NOTE — ED Provider Notes (Incomplete)
Facility Based Crisis Admission H&P  Date: 12/18/22 Patient Name: Brittany Payne MRN: [id number] Chief Complaint: Requesting fentanyl detox  Diagnoses:  Final diagnoses:  None   HPI:  Brittany Payne is a 32 y.o., female with opioid use disorder, substance-induced mood disorder, bipolar disorder, stimulant use disorder, and anxiety who presents to the Facility Based Crisis center from the community for evaluation and management of fentanyl detox. She states that her goal is to get off drugs and go to rehab as she is scared that if she did outpatient care, she would relapse while at home during this period.  The patient was seen here on 12/13/2022, 5 days ago, for fentanyl detox but left as she felt that she could handle detox on her own. Upon spending time with her children through a supervised visit, she felt stressed and used fentanyl on 12/16/22 at 0100, two days ago (~57 hours ago). The patient developed nausea, vomiting, and sweatiness yesterday. Her vomiting resolved in the morning. At this time, she feels nausea, sweatiness, and had trouble sleeping last night.  She has been using fentanyl 4/5 times a day since March or April of this year (4-5 months ago). She states that she injects between her toes primarily and this is how she used it on Monday. The patient does not spend money on fentanyl and states she is "sleeping with" her supplier. Her last use of methamphetamine was about 1.5-2 months ago. She has also used a CBD pen ($160 per month) and smokes 1-1.5 ppd cigarettes since age 58.  She does not currently take any medications at home.  See below for psychiatric, family, and social history.   PHQ 2-9:  Flowsheet Row ED from 12/17/2022 in [facility]  Thoughts that you would be better off dead, or of hurting yourself in some way More than half the days  PHQ-9 Total Score 11       Flowsheet Row ED from 12/17/2022 in [facility] Most recent reading at 12/17/2022  9:24 PM ED from 12/17/2022 in [facility] Most recent reading at 12/17/2022  6:19 PM ED from 12/13/2022 in [facility] Most recent reading at 12/13/2022  1:01 PM  C-SSRS RISK CATEGORY No Risk Low Risk No Risk        Total Time spent with patient: 20 minutes  Musculoskeletal  Strength & Muscle Tone: within normal limits Gait & Station: normal Patient leans: N/A  Psychiatric Specialty Exam  Presentation General Appearance: Disheveled   Eye Contact:Minimal   Speech:Clear and Coherent; Normal Rate   Speech Volume:Normal   Handedness:Right   Mood and Affect  Mood:Depressed   Affect:Tearful; Depressed   Thought Process  Thought Processes:Coherent; Goal Directed   Descriptions of Associations:Intact   Orientation:Full (Time, Place and Person)   Thought Content:Logical  Diagnosis of Schizophrenia or Schizoaffective disorder in past: No    Hallucinations:Hallucinations: None   Ideas of Reference:None   Suicidal Thoughts:Suicidal Thoughts: Yes, Passive SI Passive Intent and/or Plan: Without Intent; Without Plan   Homicidal Thoughts:Homicidal Thoughts: No   Sensorium  Memory:Immediate Fair; Recent Fair; Remote Fair   Judgment:Fair   Insight:Fair   Executive Functions  Concentration:Fair   Attention Span:Fair   Recall:Fair   Fund of Knowledge:Fair   Language:Fair   Psychomotor Activity  Psychomotor Activity:Psychomotor Activity: Normal   Assets  Assets:Communication Skills; Desire for Improvement; Financial Resources/Insurance; Housing; Leisure Time; Physical Health; Resilience; Social Support  Sleep  Sleep:Sleep: Good Number of Hours of Sleep: 8   Nutritional Assessment (For OBS and FBC admissions only) Has the patient had a weight loss or gain of 10 pounds or more in the last 3 months?: No Has the patient had a decrease in food  intake/or appetite?: No Does the patient have dental problems?: No Does the patient have eating habits or behaviors that may be indicators of an eating disorder including binging or inducing vomiting?: No Has the patient recently lost weight without trying?: 0 Has the patient been eating poorly because of a decreased appetite?: 0 Malnutrition Screening Tool Score: 0    Physical Exam Constitutional:      General: She is not in acute distress.    Appearance: She is not ill-appearing or toxic-appearing.  HENT:     Head: Normocephalic.  Eyes:     Conjunctiva/sclera: Conjunctivae normal.  Pulmonary:     Effort: Pulmonary effort is normal.  Musculoskeletal:        General: Normal range of motion.  Neurological:     Mental Status: She is alert.    Review of Systems  Constitutional:  Positive for diaphoresis.  Gastrointestinal:  Positive for nausea and vomiting.  Psychiatric/Behavioral:  Positive for substance abuse. The patient is nervous/anxious and has insomnia.    Blood pressure 131/83, pulse (!) 59, temperature 98 F (36.7 C), temperature source Oral, resp. rate 18, SpO2 96%. There is no height or weight on file to calculate BMI.  Is the patient at risk to self? No Has the patient been a risk to self in the past 6 months? No Has the patient been a risk to self within the distant past? No Is the patient a risk to others? No Has the patient been a risk to others in the past 6 months? No Has the patient been a risk to others within the distant past? No  Past psychiatric History: The patient says that she was diagnosed with bipolar disorder as a teen (treated by unspecified antipsychotic) with psychiatric hospitalizations when she was a teenager at [facility] and [facility]. 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 The patient says that she has cut her wrists as a teen and would  never do that again. Although she has had thoughts of "not wanting to be here" and clarifies that she means "not on this earth" she states that she would not harm herself and is scared to hurt herself. She has not thought of any specific ways in which she may hurt herself.  Family History: Mother: History of schizophrenia, MDD, and HTN. Addiction to methamphetamine and crack-cocaine warranting rehabilitation and is now sober. Father: MDD, HTN, DM, and frequent alcohol use. Oldest daughter: T1DM  Social History: Lives with her boyfriend and his mother. Her oldest of 4 children lives with her sister and the other 3 live with their father. She has current legal issues after being pulled over and drugs were found in the car, leading to her baby being taken out of her custody. She is allowed supervised visits with her children. Her boyfriend and mother are supportive of her rehabilitation and staying away from drug usage.  Last Labs:     Latest Ref Rng & Units 12/17/2022    8:07 PM 12/13/2022    5:59 PM 05/27/2022    9:01 AM  CMP  Glucose 70 - 99 mg/dL 74  259  88   BUN 6 - 20 mg/dL 5  6  18    Creatinine 0.44 - 1.00 mg/dL 6.21  3.08  6.57   Sodium 135 - 145 mmol/L 139  137  137   Potassium 3.5 - 5.1 mmol/L 3.2  2.9  3.4   Chloride 98 - 111 mmol/L 100  101  104   CO2 22 - 32 mmol/L 27  22  22    Calcium 8.9 - 10.3 mg/dL 9.0  8.6  8.5   Total Protein 6.5 - 8.1 g/dL 7.4  6.7  6.8   Total Bilirubin 0.3 - 1.2 mg/dL 0.9  0.4  0.4   Alkaline Phos 38 - 126 U/L 82  81  62   AST 15 - 41 U/L 11  30  14    ALT 0 - 44 U/L 10  11  14    CBC    Component Value Date/Time   WBC 7.3 12/17/2022 2007   RBC 4.71 12/17/2022 2007   HGB 13.6 12/17/2022 2007   HCT 41.6 12/17/2022 2007   PLT 160 12/17/2022 2007   MCV 88.3 12/17/2022 2007   MCH 28.9 12/17/2022 2007   MCHC 32.7 12/17/2022 2007   RDW 16.0 (H) 12/17/2022 2007   LYMPHSABS 1.6 12/17/2022 2007   MONOABS 0.4 12/17/2022 2007   EOSABS 0.2 12/17/2022 2007    BASOSABS 0.0 12/17/2022 2007    Allergies: Yellow jacket venom, Amoxicillin, Celexa [citalopram], Penicillins, and Vistaril [hydroxyzine hcl]  PTA Medications: (Not in a hospital admission)   Long Term Goals: Improvement in symptoms so as ready for discharge  Short Term Goals: Patient will verbalize feelings in meetings with treatment team members. and Patient will take medications as prescribed daily.  Medical Decision Making  Scheduled -- *** -- Patient in need of nicotine replacement; nicotine polacrilex (gum) and nicotine patch 14 mg / 24 hours ordered. Smoking cessation encouraged  PRNs              -- start acetaminophen 650 mg every 6 hours as needed for mild to moderate pain, fever, and headaches              -- start bismuth subsalicylate 524 mg oral chewable tablet every 3 hours as needed for diarrhea / loose stools              -- start senna 8.6 mg oral at bedtime and polyethylene glycol 17 g oral daily as needed for mild to moderate constipation              -- start ondansetron 8 mg every 8 hours as needed for nausea or vomiting              -- start aluminum-magnesium hydroxide + simethicone 30 mL every 4 hours as needed for heartburn or indigestion              -- start melatonin 3 mg bedtime as needed for insomnia  -- As needed agitation protocol in-place  Recommendations  Based on my evaluation the patient does not appear to have an emergency medical condition.  Kayleen Memos, Medical Student 12/18/22  8:31 AM

## 2022-12-18 NOTE — ED Notes (Signed)
Patient is sleeping. Respirations equal and unlabored, skin warm and dry. No change in assessment or acuity. Routine safety checks conducted according to facility protocol. Will continue to monitor for safety.   

## 2022-12-18 NOTE — Group Note (Signed)
Group Topic: Understanding Self  Group Date: 12/18/2022 Start Time: 1330 End Time: 1400 Facilitators: Mayer Camel L, RN  Department: [facility]  Number of Participants: 3  Group Focus: self-awareness Treatment Modality:  Psychoeducation Interventions utilized were exploration and group exercise Purpose: increase insight, regain self-worth, and reinforce self-care  Name: Brittany Payne Date of Birth:  16, 1992  MR: [id number]    Level of Participation: active Quality of Participation: offered feedback and superficial Interactions with others: gave feedback Mood/Affect: closed / guarded and tearful Triggers (if applicable): none Cognition: guilty Progress: Minimal Response: Patient had minimal participation in group activity. During participation, patient appeared guarded, began to well up when talking of children. Plan: follow-up needed  Patients Problems:  Patient Active Problem List   Diagnosis Date Noted   Polysubstance abuse (HCC) 12/17/2022   Opioid use disorder 12/13/2022   Substance induced mood disorder (HCC) 12/13/2022   Stimulant use disorder 12/13/2022   S/P right knee arthroscopy 05/28/22 DIAGNOSTIC/ NO TEARS/ NORMAL KNEE JOINT 06/04/2022   Right knee pain 05/28/2022   Umbilical hernia without obstruction and without gangrene 07/26/2019   Positive urine drug screen 12/31/2018   Anemia 02/05/2017   Bipolar disorder (HCC) 02/05/2017   Depression with anxiety 02/05/2017   Enlargement of spleen 02/05/2017   Migraine 02/05/2017

## 2022-12-18 NOTE — ED Provider Notes (Signed)
Facility Based Crisis Admission H&P  Date: 12/18/22 Patient Name: Brittany Payne MRN: [id number] Chief Complaint: Requesting fentanyl detox  Diagnoses:  Final diagnoses:  None   HPI:  Brittany Payne is a 31 y.o., female with opioid use disorder, substance-induced mood disorder, bipolar disorder, stimulant use disorder, and anxiety who presents to the Facility Based Crisis center from the community for evaluation and management of fentanyl withdrawal and detox.  The patient was seen here on 12/13/2022, 5 days ago, for fentanyl detox but left as she felt that she could handle detox on her own. Upon spending time with her children through a supervised visit, she felt stressed and used fentanyl on 12/16/22 at 0100, two days ago (~57 hours ago). The patient developed nausea, vomiting, and sweatiness yesterday. Her vomiting resolved in the morning. At this time, she feels nausea, sweatiness, and had trouble sleeping last night.  She has been using fentanyl 4/5 times a day since March or April of this year (4-5 months ago). She states that she injects between her toes primarily and this is how she used it on Monday. The patient does not spend money on fentanyl and states she is "sleeping with" her supplier. Her last use of methamphetamine was about 1.5-2 months ago. She has also used a CBD pen ($160 per month) and smokes 1-1.5 ppd cigarettes since age 4.  Patient reports a prior diagnosis of bipolar disorder for which she was treated in the past as a teenager. She has not received any psychotropic medications for this condition in many years.  She does not currently take any medications at home.  She states that her goal is to get off drugs and go to rehab as she is scared that if she did outpatient care, she would relapse while at home during this period.  PHQ 2-9:  Flowsheet Row ED from 12/17/2022 in [facility]  Thoughts that you would be better off dead, or of hurting  yourself in some way More than half the days  PHQ-9 Total Score 11       Flowsheet Row ED from 12/17/2022 in [facility] Most recent reading at 12/17/2022  9:24 PM ED from 12/17/2022 in [facility] Most recent reading at 12/17/2022  6:19 PM ED from 12/13/2022 in [facility] Most recent reading at 12/13/2022  1:01 PM  C-SSRS RISK CATEGORY No Risk Low Risk No Risk        Total Time spent with patient: 20 minutes  Musculoskeletal  Strength & Muscle Tone: within normal limits Gait & Station: normal Patient leans: N/A  Psychiatric Specialty Exam  Presentation General Appearance: Appropriate for Environment   Eye Contact:Good   Speech:Clear and Coherent; Normal Rate   Speech Volume:Normal   Handedness:Right   Mood and Affect  Mood:Dysphoric   Affect:Appropriate; Congruent; Tearful   Thought Process  Thought Processes:Coherent; Goal Directed; Linear   Descriptions of Associations:Intact   Orientation:Full (Time, Place and Person)   Thought Content:Logical  Diagnosis of Schizophrenia or Schizoaffective disorder in past: No    Hallucinations:Hallucinations: None   Ideas of Reference:None   Suicidal Thoughts:Suicidal Thoughts: Yes, Passive SI Passive Intent and/or Plan: Without Intent; Without Plan   Homicidal Thoughts:Homicidal Thoughts: No   Sensorium  Memory:Immediate Fair; Recent Fair; Remote Fair   Judgment:Good   Insight:Good   Executive Functions  Concentration:Good   Attention Span:Fair   Recall:Fair   [employer] of Knowledge:Fair   Language:Fair  Psychomotor Activity  Psychomotor Activity:Psychomotor Activity: Normal   Assets  Assets:Desire for Improvement; Social Support   Sleep  Sleep:Sleep: Fair Number of Hours of Sleep: 8   Nutritional Assessment (For OBS and FBC admissions only) Has the patient had a weight loss or gain of 10 pounds  or more in the last 3 months?: No Has the patient had a decrease in food intake/or appetite?: No Does the patient have dental problems?: No Does the patient have eating habits or behaviors that may be indicators of an eating disorder including binging or inducing vomiting?: No Has the patient recently lost weight without trying?: 0 Has the patient been eating poorly because of a decreased appetite?: 0 Malnutrition Screening Tool Score: 0   Physical Exam Constitutional:      General: She is not in acute distress.    Appearance: She is not ill-appearing or toxic-appearing.  HENT:     Head: Normocephalic.  Eyes:     Conjunctiva/sclera: Conjunctivae normal.  Pulmonary:     Effort: Pulmonary effort is normal.  Musculoskeletal:        General: Normal range of motion.  Neurological:     Mental Status: She is alert.   Review of Systems  Constitutional:  Positive for diaphoresis.  Gastrointestinal:  Positive for nausea and vomiting.  Psychiatric/Behavioral:  Positive for substance abuse. The patient is nervous/anxious and has insomnia.    Blood pressure 119/80, pulse (!) 59, temperature 98 F (36.7 C), temperature source Oral, resp. rate 18, SpO2 96%. There is no height or weight on file to calculate BMI.  Is the patient at risk to self? No Has the patient been a risk to self in the past 6 months? No Has the patient been a risk to self within the distant past? No Is the patient a risk to others? No Has the patient been a risk to others in the past 6 months? No Has the patient been a risk to others within the distant past? No  Past psychiatric History: The patient says that she was diagnosed with bipolar disorder as a teen (treated by unspecified antipsychotic) with psychiatric hospitalizations when she was a teenager at [facility]. She also reports major depressive disorder with peripartum onset for which she was treated with an antidepressant until 4 months after delivery  (2021). The patient says that she has cut her wrists as a teen and would never do that again. Although she has had thoughts of "not wanting to be here" and clarifies that she means "not on this earth" she states that she would not harm herself and is scared to hurt herself. She has not thought of any specific ways in which she may hurt herself.  Family History: Mother: History of schizophrenia, MDD, and HTN. Addiction to methamphetamine and crack-cocaine warranting rehabilitation and is now sober. Father: MDD, HTN, DM, and frequent alcohol use. Oldest daughter: T1DM  Social History: Lives with her boyfriend and his mother. Her oldest of 4 children lives with her sister and the other 3 live with their father. She has current legal issues after being pulled over and drugs were found in the car, leading to her baby being taken out of her custody. She is allowed supervised visits with her children. Her boyfriend and mother are supportive of her rehabilitation and staying away from drug usage.  Last Labs:     Latest Ref Rng & Units 12/17/2022    8:07 PM 12/13/2022    5:59 PM 05/27/2022  9:01 AM  CMP  Glucose 70 - 99 mg/dL 74  161  88   BUN 6 - 20 mg/dL 5  6  18    Creatinine 0.44 - 1.00 mg/dL 0.96  0.45  4.09   Sodium 135 - 145 mmol/L 139  137  137   Potassium 3.5 - 5.1 mmol/L 3.2  2.9  3.4   Chloride 98 - 111 mmol/L 100  101  104   CO2 22 - 32 mmol/L 27  22  22    Calcium 8.9 - 10.3 mg/dL 9.0  8.6  8.5   Total Protein 6.5 - 8.1 g/dL 7.4  6.7  6.8   Total Bilirubin 0.3 - 1.2 mg/dL 0.9  0.4  0.4   Alkaline Phos 38 - 126 U/L 82  81  62   AST 15 - 41 U/L 11  30  14    ALT 0 - 44 U/L 10  11  14    CBC    Component Value Date/Time   WBC 7.3 12/17/2022 2007   RBC 4.71 12/17/2022 2007   HGB 13.6 12/17/2022 2007   HCT 41.6 12/17/2022 2007   PLT 160 12/17/2022 2007   MCV 88.3 12/17/2022 2007   MCH 28.9 12/17/2022 2007   MCHC 32.7 12/17/2022 2007   RDW 16.0 (H) 12/17/2022 2007   LYMPHSABS 1.6  12/17/2022 2007   MONOABS 0.4 12/17/2022 2007   EOSABS 0.2 12/17/2022 2007   BASOSABS 0.0 12/17/2022 2007    Allergies: Yellow jacket venom, Amoxicillin, Celexa [citalopram], Penicillins, and Vistaril [hydroxyzine hcl]  PTA Medications: (Not in a hospital admission)   Long Term Goals: Improvement in symptoms so as ready for discharge  Short Term Goals: Patient will verbalize feelings in meetings with treatment team members. and Patient will take medications as prescribed daily.  Medical Decision Making  Scheduled -- COWS with as needed clonidine protocol -- ID screening for high risk IVDU -- Patient in need of nicotine replacement; nicotine polacrilex (gum) and nicotine patch 14 mg / 24 hours ordered. Smoking cessation encouraged  PRNs              -- start acetaminophen 650 mg every 6 hours as needed for mild to moderate pain, fever, and headaches              -- start bismuth subsalicylate 524 mg oral chewable tablet every 3 hours as needed for diarrhea / loose stools              -- start senna 8.6 mg oral at bedtime and polyethylene glycol 17 g oral daily as needed for mild to moderate constipation              -- start ondansetron 8 mg every 8 hours as needed for nausea or vomiting              -- start aluminum-magnesium hydroxide + simethicone 30 mL every 4 hours as needed for heartburn or indigestion              -- start melatonin 3 mg bedtime as needed for insomnia  -- As needed agitation protocol in-place -- Opiate withdrawal supportive care: as needed loperamide, naproxen, dicyclomine, methocarbamol, and clonidine  Recommendations  Based on my evaluation the patient does not appear to have an emergency medical condition.  Augusto Gamble, MD 12/18/22  10:39 AM

## 2022-12-18 NOTE — Progress Notes (Signed)
Pt is awake, alert and oriented X4. Pt complained of cramps and back pain. No signs of acute distress noted. PRN Naprosyn, Robaxin and scheduled meds administered with no issue. Pt denies current SI/HI/AVH, plan or intent. Staff will monitor for pt's safety.

## 2022-12-18 NOTE — Progress Notes (Signed)
Pt's COWS was 7.

## 2022-12-18 NOTE — Group Note (Signed)
Group Topic: Recovery Basics  Group Date: 12/18/2022 Start Time: 2000 End Time: 2100 Facilitators: Caroline More, LPN  Department: [facility]  Number of Participants: 5  Group Focus: abuse issues, acceptance, chemical dependency education, chemical dependency issues, clarity of thought, personal responsibility, and substance abuse education Treatment Modality:  Psychoeducation Interventions utilized were group exercise and story telling Purpose: enhance coping skills, explore maladaptive thinking, express feelings, express irrational fears, improve communication skills, increase insight, regain self-worth, reinforce self-care, relapse prevention strategies, and trigger / craving management  Name: Brittany Payne Date of Birth: [birth year]  MR: [id number]    Level of Participation: minimal Quality of Participation: cooperative Interactions with others: Appropriate Mood/Affect: flat Triggers (if applicable):N/A Cognition: confused Progress: Minimal Response: Stayed in group for majority. Left to room towards end and did not return.  Plan: patient will be encouraged to continue focusing on self and continuing education regarding substance abuse and recovery.   Patients Problems:  Patient Active Problem List   Diagnosis Date Noted   Polysubstance abuse (HCC) 12/17/2022   Opioid use disorder 12/13/2022   Substance induced mood disorder (HCC) 12/13/2022   Stimulant use disorder 12/13/2022   S/P right knee arthroscopy 05/28/22 DIAGNOSTIC/ NO TEARS/ NORMAL KNEE JOINT 06/04/2022   Right knee pain 05/28/2022   Umbilical hernia without obstruction and without gangrene 07/26/2019   Positive urine drug screen 12/31/2018   Anemia 02/05/2017   Bipolar disorder (HCC) 02/05/2017   Depression with anxiety 02/05/2017   Enlargement of spleen 02/05/2017   Migraine 02/05/2017

## 2022-12-18 NOTE — Care Management (Signed)
Writer referred patient to [facility] and the [facility].  Per chart review, the patient has an upcoming court date on December 24, 2022.     Per current circumstance with the upcoming court date the patient will likely not be accepted into a program until her legal issues have been resolved.

## 2022-12-18 NOTE — Group Note (Signed)
Group Topic: Change and Accountability  Group Date: 12/18/2022 Start Time: 1100 End Time: 1140 Facilitators: Priscille Kluver, NT  Department: [facility]  Number of Participants: 3  Group Focus: acceptance Treatment Modality:  Patient-Centered Therapy Interventions utilized were story telling Purpose: enhance coping skills  Name: Brittany Payne Date of Birth: 03-17-1991  MR: [id number]    Level of Participation: active Quality of Participation: attentive Interactions with others: gave feedback Mood/Affect: appropriate  Cognition: insightful Progress: Significant Response: Wanting to make a change and do better   Patients Problems:  Patient Active Problem List   Diagnosis Date Noted   Polysubstance abuse (HCC) 12/17/2022   Opioid use disorder 12/13/2022   Substance induced mood disorder (HCC) 12/13/2022   Stimulant use disorder 12/13/2022   S/P right knee arthroscopy 05/28/22 DIAGNOSTIC/ NO TEARS/ NORMAL KNEE JOINT 06/04/2022   Right knee pain 05/28/2022   Umbilical hernia without obstruction and without gangrene 07/26/2019   Positive urine drug screen 12/31/2018   Anemia 02/05/2017   Bipolar disorder (HCC) 02/05/2017   Depression with anxiety 02/05/2017   Enlargement of spleen 02/05/2017   Migraine 02/05/2017

## 2022-12-18 NOTE — ED Notes (Signed)
Pt in the bedroom sleeping. NAD. Respirations even and unlabored. Will continue to monitor for safety.

## 2022-12-19 DIAGNOSIS — F1193 Opioid use, unspecified with withdrawal: Secondary | ICD-10-CM

## 2022-12-19 DIAGNOSIS — F1123 Opioid dependence with withdrawal: Secondary | ICD-10-CM | POA: Diagnosis not present

## 2022-12-19 DIAGNOSIS — R45851 Suicidal ideations: Secondary | ICD-10-CM | POA: Diagnosis not present

## 2022-12-19 DIAGNOSIS — F1124 Opioid dependence with opioid-induced mood disorder: Secondary | ICD-10-CM | POA: Diagnosis not present

## 2022-12-19 DIAGNOSIS — F1914 Other psychoactive substance abuse with psychoactive substance-induced mood disorder: Secondary | ICD-10-CM | POA: Diagnosis not present

## 2022-12-19 MED ORDER — NALOXONE HCL 4 MG/0.1ML NA LIQD: NASAL | 0 refills | Status: AC

## 2022-12-19 NOTE — Care Management (Addendum)
[facility] Care Management   Writer scheduled an appointment with [facility] on 12-26-2022 at 2pm in order to receive MAT services.  [facility] Address: [address] [place], [place] [address] Phone: [phone]   Writer referred patient to the [facility].  Writer is awaiting an intake date and time to set up for the patient.  Writer met with the patient and she informed me that she would prefer to follow up with the [facility] walk in Facility in [place].  Writer cancelled the referral to the [facility]

## 2022-12-19 NOTE — ED Notes (Signed)
Pt A&O X 4, ambulatory. Discharged in no acute distress. Denies SI/HI/AVH upon discharge. Parents verbalized understanding of discharge paperwork and follow up appointments. Offered to provide pt with a taxi voucher but pt declined. Belongings give to patient from locker #15. Pt escorted to lobby via staff for transport home via personal vehicle. Safety maintained.

## 2022-12-19 NOTE — ED Notes (Signed)
Pt in the bedroom sleeping. NAD. Respirations even and unlabored. Will continue to monitor for safety.

## 2022-12-19 NOTE — ED Notes (Signed)
Call placed to [facility] for specimen pick-up

## 2022-12-19 NOTE — ED Notes (Signed)
Patient in the bedroom sleeping. NAD.  Respirations even and unlabored. Will continue to monitor for safety.

## 2022-12-19 NOTE — ED Notes (Signed)
Pt was provided breakfast.

## 2022-12-19 NOTE — Discharge Instructions (Addendum)
Dear Brittany Payne,  It was a pleasure to take care of you during your stay at [facility] where you were treated for your Substance induced mood disorder (HCC).  While you were here, you were:  observed and cared for by our nurses and nursing assistants  treated with medications by your psychiatrists  evaluated with imaging / lab tests, and treated with medicines / procedures by your doctors  provided individual and group therapy by therapists  provided resources by our social workers and case managers  Please review the medication list provided to you at discharge and stop, start taking, or continue taking the medications listed there.  You should also follow-up with your primary care doctor, or start seeing one if you don't have one yet. If applicable, here are some scheduled follow-ups for you:    I recommend abstinence from alcohol, tobacco, and other illicit drug use.   If your psychiatric symptoms or suicidal thoughts recur, worsen, or if you have side effects to your psychiatric medications, call your outpatient psychiatric provider, 911, [phone] or go to the nearest emergency department.  Take care!  Signed: Augusto Gamble, MD 12/19/2022, 12:00 PM  Naloxone (Narcan) can help reverse an overdose when given to the victim quickly.  [facility] offers free naloxone kits and instructions/training on its use.  Add naloxone to your first aid kit and you can help save a life. A prescription can be filled at your local pharmacy or free kits are provided by the county.  Pick up your free kit at the following locations:   [place]:  [facility], [address] [place] [address] [phone]) [facility] [address] [place] [address] [phone]) [facility] [address] [place] [address]  [place]: [facility] [address] [place] [address] ([phone]) [facility] [address] [place] [address] [phone])   Based on the information that you have provided and the presenting issues outpatient services and resources for have been recommended.  It is imperative that you follow through with treatment recommendations within 5-7 days from the of discharge to mitigate further risk to your safety and mental well-being. A list of referrals has been provided below to get you started.  You are not limited to the list provided.  In case of an urgent crisis, you may contact the Mobile Crisis Unit with [facility] at [phone].   Writer scheduled an appointment with [facility] on 12-26-2022 at 2pm in order to receive MAT services.   [facility] Address: [address] [place], [place] [address] Phone: [phone]     [facility]  [address]  [place]  [phone]

## 2022-12-19 NOTE — ED Provider Notes (Signed)
FBC/OBS ASAP Discharge Summary  Date: 12/19/22 Name: Brittany Payne MRN: [id number]  Discharge Diagnoses:  Final diagnoses:  None   Brittany Payne is a 32 y.o., female with opioid use disorder, substance-induced mood disorder, bipolar disorder, stimulant use disorder, and anxiety who presents to the [facility] from the community for evaluation and management of fentanyl withdrawal and detox.   Subjective: patient says she wants to leave to see her kids at 3pm. She understands discharge follow-up has not yet been established  Stay Summary: During the patient's hospitalization, patient had extensive initial psychiatric evaluation, and follow-up psychiatric evaluations every day.  The following meds were provided and managed during her stay. Scheduled Meds: Continuous Infusions: PRN Meds:.acetaminophen, alum & mag hydroxide-simeth, bismuth subsalicylate, cloNIDine, dicyclomine, haloperidol **AND** LORazepam **AND** diphenhydrAMINE, haloperidol lactate **AND** LORazepam **AND** diphenhydrAMINE, loperamide, melatonin, methocarbamol, naproxen, ondansetron, polyethylene glycol, senna  Patient's care was discussed during the interdisciplinary team meeting every day during the hospitalization.  The patient denies any side effects to prescribed psychiatric medication.  Gradually, patient started adjusting to milieu. The patient was evaluated each day by a clinical provider to ascertain response to treatment. Improvement was noted by the patient's report of decreasing symptoms, improved sleep and appetite, affect, medication tolerance, behavior, and participation in unit programming.  Patient was asked each day to complete a self inventory noting mood, mental status, pain, new symptoms, anxiety and concerns.   Symptoms were reported as significantly decreased or resolved completely by discharge.  The patient reports that their mood is stable.  The patient denied having suicidal  thoughts for more than 48 hours prior to discharge.  Patient denies having homicidal thoughts.  Patient denies having auditory hallucinations.  Patient denies any visual hallucinations or other symptoms of psychosis.  The patient was motivated to continue taking medication with a goal of continued improvement in mental health.   Symptoms were reported as significantly decreased or resolved completely by discharge.   On day of discharge, the patient reports that their mood is stable. The patient denied having suicidal thoughts for more than 48 hours prior to discharge.  Patient denies having homicidal thoughts.  Patient denies having auditory hallucinations.  Patient denies any visual hallucinations or other symptoms of psychosis. The patient was motivated to continue taking medication with a goal of continued improvement in mental health.   The patient reports their target psychiatric symptoms of opioid withdrawal responded well to the psychiatric medications, and the patient reports overall benefit other psychiatric hospitalization. Supportive psychotherapy was provided to the patient. The patient also participated in regular group therapy while hospitalized. Coping skills, problem solving as well as relaxation therapies were also part of the unit programming.  Labs were reviewed with the patient, and abnormal results were discussed with the patient.  The patient is able to verbalize their individual safety plan to this provider.  # It is recommended to the patient to continue psychiatric medications as prescribed, after discharge from the hospital.    # It is recommended to the patient to follow up with your outpatient psychiatric provider and PCP.  # It was discussed with the patient, the impact of alcohol, drugs, tobacco have been there overall psychiatric and medical wellbeing, and total abstinence from substance use was recommended the patient.ed.  # Prescriptions provided or sent directly  to preferred pharmacy at discharge. Patient agreeable to plan. Given opportunity to ask questions. Appears to feel comfortable with discharge.    # In the event of  worsening symptoms, the patient is instructed to call the crisis hotline, 911 and or go to the nearest ED for appropriate evaluation and treatment of symptoms. To follow-up with primary care provider for other medical issues, concerns and or health care needs  # Patient was discharged to the community with plans to follow up provided in the discharge instructions.  On day of discharge patient denying passive or active SI. She denies AVH. She is not exhibiting any life-threatening substance withdrawal symptoms. She was counseled about risk of relapse and expresses understanding. Naloxone prescription provided in case of overdose. Patient is safe to discharge.  Total Time spent with patient: 45 minutes  Past psychiatric History: The patient says that she was diagnosed with bipolar disorder as a teen (treated by unspecified antipsychotic) with psychiatric hospitalizations when she was a teenager at [facility] and [facility]. 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 The patient says that she has cut her wrists as a teen and would never do that again. Although she has had thoughts of "not wanting to be here" and clarifies that she means "not on this earth" she states that she would not harm herself and is scared to hurt herself. She has not thought of any specific ways in which she may hurt herself.   Family History: Mother: History of schizophrenia, MDD, and HTN. Addiction to methamphetamine and crack-cocaine warranting rehabilitation and is now sober. Father: MDD, HTN, DM, and frequent alcohol use. Oldest daughter: T1DM   Social History: Lives with her boyfriend and his mother. Her oldest of 4 children lives with her sister and the  other 3 live with their father. She has current legal issues after being pulled over and drugs were found in the car, leading to her baby being taken out of her custody. She is allowed supervised visits with her children. Her boyfriend and mother are supportive of her rehabilitation and staying away from drug usage. Tobacco Cessation:  N/A, patient does not currently use tobacco products  Current Medications:  Current Facility-Administered Medications  Medication Dose Route Frequency Provider Last Rate Last Admin   acetaminophen (TYLENOL) tablet 650 mg  650 mg Oral Q6H PRN Augusto Gamble, MD   650 mg at 12/18/22 1738   alum & mag hydroxide-simeth (MAALOX/MYLANTA) 200-200-20 MG/5ML suspension 30 mL  30 mL Oral Q4H PRN Augusto Gamble, MD       bismuth subsalicylate (PEPTO BISMOL) chewable tablet 524 mg  524 mg Oral Q3H PRN Augusto Gamble, MD       cloNIDine (CATAPRES) tablet 0.1 mg  0.1 mg Oral Q4H PRN Augusto Gamble, MD       dicyclomine (BENTYL) tablet 20 mg  20 mg Oral Q6H PRN Augusto Gamble, MD   20 mg at 12/18/22 1745   haloperidol (HALDOL) tablet 5 mg  5 mg Oral Q6H PRN Augusto Gamble, MD       And   LORazepam (ATIVAN) tablet 1 mg  1 mg Oral Q6H PRN Augusto Gamble, MD       And   diphenhydrAMINE (BENADRYL) capsule 25 mg  25 mg Oral Q6H PRN Augusto Gamble, MD       haloperidol lactate (HALDOL) injection 5 mg  5 mg Intramuscular Q6H PRN Augusto Gamble, MD       And   LORazepam (ATIVAN) injection 1 mg  1 mg Intravenous Q6H PRN Augusto Gamble, MD  And   diphenhydrAMINE (BENADRYL) injection 25 mg  25 mg Intramuscular Q6H PRN Augusto Gamble, MD       loperamide (IMODIUM) capsule 2-4 mg  2-4 mg Oral PRN Augusto Gamble, MD       melatonin tablet 3 mg  3 mg Oral QHS PRN Augusto Gamble, MD   3 mg at 12/18/22 2204   methocarbamol (ROBAXIN) tablet 500 mg  500 mg Oral Q8H PRN Augusto Gamble, MD   500 mg at 12/18/22 1738   naproxen (NAPROSYN) tablet 500 mg  500 mg Oral BID PRN Augusto Gamble, MD   500 mg at 12/18/22 2204   ondansetron  (ZOFRAN) tablet 8 mg  8 mg Oral Q8H PRN Augusto Gamble, MD       polyethylene glycol (MIRALAX / GLYCOLAX) packet 17 g  17 g Oral Daily PRN Augusto Gamble, MD       senna (SENOKOT) tablet 8.6 mg  1 tablet Oral QHS PRN Augusto Gamble, MD       No current outpatient medications on file.    PTA Medications: (Not in a hospital admission)      12/18/2022   10:16 AM 12/17/2022    7:26 PM 12/13/2022    1:31 PM  Depression screen PHQ 2/9  Decreased Interest 0 1 0  Down, Depressed, Hopeless 1 1 0  PHQ - 2 Score 1 2 0  Altered sleeping  1   Tired, decreased energy  1   Change in appetite  1   Feeling bad or failure about yourself   1   Trouble concentrating  2   Moving slowly or fidgety/restless  1   Suicidal thoughts  2   PHQ-9 Score  11   Difficult doing work/chores  Somewhat difficult     Flowsheet Row ED from 12/17/2022 in [facility] Most recent reading at 12/17/2022  9:24 PM ED from 12/17/2022 in [facility] Most recent reading at 12/17/2022  6:19 PM ED from 12/13/2022 in [facility] Most recent reading at 12/13/2022  1:01 PM  C-SSRS RISK CATEGORY No Risk Low Risk No Risk       Musculoskeletal  Strength & Muscle Tone: within normal limits Gait & Station: normal Patient leans: N/A   Psychiatric Specialty Exam  Presentation General Appearance: Appropriate for Environment; Well Groomed   Eye Contact:Good   Speech:Clear and Coherent; Normal Rate   Speech Volume:Normal   Handedness:Right    Mood and Affect  Mood:-- ("good")   Affect:Appropriate; Congruent; Full Range    Thought Process  Thought Processes:Coherent; Linear; Goal Directed   Descriptions of Associations:Intact   Orientation:Full (Time, Place and Person)   Thought Content:Logical; WDL   Diagnosis of Schizophrenia or Schizoaffective disorder in past: No    Hallucinations:Hallucinations: None   Ideas of  Reference:None   Suicidal Thoughts:Suicidal Thoughts: No SI Passive Intent and/or Plan: Without Intent; Without Plan   Homicidal Thoughts:Homicidal Thoughts: No    Sensorium  Memory:Immediate Good; Recent Good; Remote Good   Judgment:Good   Insight:Good    Executive Functions  Concentration:Good   Attention Span:Good   Recall:Good   Fund of Knowledge:Good   Language:Good   Psychomotor Activity  Psychomotor Activity:Psychomotor Activity: Normal   Assets  Assets:Resilience   Sleep  Sleep:Sleep: Good   No data recorded  Physical Exam  Physical Exam ROS Blood pressure 114/80, pulse (!) 58, temperature 98.3 F (36.8 C), temperature source Oral, resp. rate 18, SpO2 99%. There  is no height or weight on file to calculate BMI.  Demographic Factors:  Caucasian and Low socioeconomic status  Loss Factors: Legal issues  Historical Factors: Family history of mental illness or substance abuse  Risk Reduction Factors:   NA  Continued Clinical Symptoms:  Alcohol/Substance Abuse/Dependencies  Cognitive Features That Contribute To Risk:  Thought constriction (tunnel vision)    Suicide Risk:  Mild:  Suicidal ideation of limited frequency, intensity, duration, and specificity.  There are no identifiable plans, no associated intent, mild dysphoria and related symptoms, good self-control (both objective and subjective assessment), few other risk factors, and identifiable protective factors, including available and accessible social support.  Plan Of Care/Follow-up recommendations:  Activity: as tolerated  Diet: heart healthy  Other: -Follow-up with your outpatient psychiatric provider -instructions on appointment date, time, and address (location) are provided to you in discharge paperwork.  -Take your psychiatric medications as prescribed at discharge - instructions are provided to you in the discharge paperwork  -Follow-up with outpatient primary  care doctor and other specialists -for management of chronic medical disease, including: health maintenance checks  -Testing: Follow-up with outpatient provider for abnormal lab results: follow-up infectious disease screening  -Recommend abstinence from alcohol, tobacco, and other illicit drug use at discharge.   -If your psychiatric symptoms recur, worsen, or if you have side effects to your psychiatric medications, call your outpatient psychiatric provider, 911, [phone] or go to the nearest emergency department.  -If suicidal thoughts recur, call your outpatient psychiatric provider, 911, [phone] or go to the nearest emergency department.  Disposition: home / self-care  Augusto Gamble, MD 12/19/2022, 11:59 AM

## 2022-12-19 NOTE — ED Notes (Signed)
Pt was provided lunch

## 2022-12-19 NOTE — ED Provider Notes (Signed)
Behavioral Health Progress Note  Date and Time: 12/19/2022 8:01 AM Name: Brittany Payne MRN:  [id number]  Subjective: Patient says she is not experiencing any cravings for Fentanyl at this time - reports the withdrawal symptoms are well-controlled on current medication regimen.  She says she would like to go to rehab but has a court date she has to be at on 8/13 so after discussing with social workers she is leaning more towards doing outpatient substance use treatment.  Patient does not endorse any side-effects they attribute to medications. Patient does not endorse any somatic complaints  Diagnosis:  Final diagnoses:  None    Total Time spent with patient: 1 hour   Past psychiatric History: The patient says that she was diagnosed with bipolar disorder as a teen (treated by unspecified antipsychotic) with psychiatric hospitalizations when she was a teenager at [facility] and [facility]. 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 The patient says that she has cut her wrists as a teen and would never do that again. Although she has had thoughts of "not wanting to be here" and clarifies that she means "not on this earth" she states that she would not harm herself and is scared to hurt herself. She has not thought of any specific ways in which she may hurt herself.   Family History: Mother: History of schizophrenia, MDD, and HTN. Addiction to methamphetamine and crack-cocaine warranting rehabilitation and is now sober. Father: MDD, HTN, DM, and frequent alcohol use. Oldest daughter: T1DM   Social History: Lives with her boyfriend and his mother. Her oldest of 4 children lives with her sister and the other 3 live with their father. She has current legal issues after being pulled over and drugs were found in the car, leading to her baby being taken out of her custody. She is allowed  supervised visits with her children. Her boyfriend and mother are supportive of her rehabilitation and staying away from drug usage.   Sleep: Good  Appetite:  Good  Current Medications: Current Facility-Administered Medications  Medication Dose Route Frequency Provider Last Rate Last Admin   acetaminophen (TYLENOL) tablet 650 mg  650 mg Oral Q6H PRN Augusto Gamble, MD   650 mg at 12/18/22 1738   alum & mag hydroxide-simeth (MAALOX/MYLANTA) 200-200-20 MG/5ML suspension 30 mL  30 mL Oral Q4H PRN Augusto Gamble, MD       bismuth subsalicylate (PEPTO BISMOL) chewable tablet 524 mg  524 mg Oral Q3H PRN Augusto Gamble, MD       cloNIDine (CATAPRES) tablet 0.1 mg  0.1 mg Oral Q4H PRN Augusto Gamble, MD       dicyclomine (BENTYL) tablet 20 mg  20 mg Oral Q6H PRN Augusto Gamble, MD   20 mg at 12/18/22 1745   haloperidol (HALDOL) tablet 5 mg  5 mg Oral Q6H PRN Augusto Gamble, MD       And   LORazepam (ATIVAN) tablet 1 mg  1 mg Oral Q6H PRN Augusto Gamble, MD       And   diphenhydrAMINE (BENADRYL) capsule 25 mg  25 mg Oral Q6H PRN Augusto Gamble, MD       haloperidol lactate (HALDOL) injection 5 mg  5 mg Intramuscular Q6H PRN Augusto Gamble, MD       And   LORazepam (ATIVAN) injection 1 mg  1 mg Intravenous Q6H PRN Augusto Gamble, MD  And   diphenhydrAMINE (BENADRYL) injection 25 mg  25 mg Intramuscular Q6H PRN Augusto Gamble, MD       loperamide (IMODIUM) capsule 2-4 mg  2-4 mg Oral PRN Augusto Gamble, MD       melatonin tablet 3 mg  3 mg Oral QHS PRN Augusto Gamble, MD   3 mg at 12/18/22 2204   methocarbamol (ROBAXIN) tablet 500 mg  500 mg Oral Q8H PRN Augusto Gamble, MD   500 mg at 12/18/22 1738   naproxen (NAPROSYN) tablet 500 mg  500 mg Oral BID PRN Augusto Gamble, MD   500 mg at 12/18/22 2204   ondansetron (ZOFRAN) tablet 8 mg  8 mg Oral Q8H PRN Augusto Gamble, MD       polyethylene glycol (MIRALAX / GLYCOLAX) packet 17 g  17 g Oral Daily PRN Augusto Gamble, MD       senna (SENOKOT) tablet 8.6 mg  1 tablet Oral QHS PRN Augusto Gamble, MD        No current outpatient medications on file.    Labs  Lab Results:     Latest Ref Rng & Units 12/17/2022    8:07 PM 12/13/2022    5:59 PM 05/27/2022    9:01 AM  CBC  WBC 4.0 - 10.5 K/uL 7.3  7.8  6.3   Hemoglobin 12.0 - 15.0 g/dL 40.3  47.4  25.9   Hematocrit 36.0 - 46.0 % 41.6  38.4  35.8   Platelets 150 - 400 K/uL 160  159  126       Latest Ref Rng & Units 12/17/2022    8:07 PM 12/13/2022    5:59 PM 05/27/2022    9:01 AM  CMP  Glucose 70 - 99 mg/dL 74  563  88   BUN 6 - 20 mg/dL 5  6  18    Creatinine 0.44 - 1.00 mg/dL 8.75  6.43  3.29   Sodium 135 - 145 mmol/L 139  137  137   Potassium 3.5 - 5.1 mmol/L 3.2  2.9  3.4   Chloride 98 - 111 mmol/L 100  101  104   CO2 22 - 32 mmol/L 27  22  22    Calcium 8.9 - 10.3 mg/dL 9.0  8.6  8.5   Total Protein 6.5 - 8.1 g/dL 7.4  6.7  6.8   Total Bilirubin 0.3 - 1.2 mg/dL 0.9  0.4  0.4   Alkaline Phos 38 - 126 U/L 82  81  62   AST 15 - 41 U/L 11  30  14    ALT 0 - 44 U/L 10  11  14      Blood Alcohol level:  Lab Results  Component Value Date   ETH <10 12/17/2022   ETH <5 01/29/2016   Metabolic Disorder Labs: Lab Results  Component Value Date   HGBA1C 5.0 12/17/2022   MPG 96.8 12/17/2022   MPG 102.54 12/13/2022   Lab Results  Component Value Date   PROLACTIN 9.7 12/17/2022   Lab Results  Component Value Date   CHOL 209 (H) 12/17/2022   TRIG 208 (H) 12/17/2022   HDL 36 (L) 12/17/2022   CHOLHDL 5.8 12/17/2022   VLDL 42 (H) 12/17/2022   LDLCALC 131 (H) 12/17/2022   LDLCALC 94 12/13/2022   Therapeutic Lab Levels: No results found for: "LITHIUM" No results found for: "VALPROATE" No results found for: "CBMZ" Physical Findings   PHQ2-9    Flowsheet Row ED from 12/17/2022 in [facility] Most recent reading at 12/18/2022 10:16 AM ED from 12/17/2022 in [facility] Most recent reading at 12/17/2022  7:26 PM ED from 12/13/2022 in [facility] Most  recent reading at 12/13/2022  1:31 PM Office Visit from 11/07/2020 in [facility] Most recent reading at 11/07/2020  1:34 PM  PHQ-2 Total Score 1 2 0 0  PHQ-9 Total Score -- 11 -- --      Flowsheet Row ED from 12/17/2022 in [facility] Most recent reading at 12/17/2022  9:24 PM ED from 12/17/2022 in [facility] Most recent reading at 12/17/2022  6:19 PM ED from 12/13/2022 in [facility] Most recent reading at 12/13/2022  1:01 PM  C-SSRS RISK CATEGORY No Risk Low Risk No Risk       Musculoskeletal  Strength & Muscle Tone: within normal limits Gait & Station: normal Patient leans: N/A  Psychiatric Specialty Exam  Presentation  General Appearance: Appropriate for Environment   Eye Contact:Good   Speech:Clear and Coherent; Normal Rate   Speech Volume:Normal   Handedness:Right   Mood and Affect  Mood:Dysphoric   Affect:Appropriate; Congruent; Tearful   Thought Process  Thought Processes:Coherent; Goal Directed; Linear   Descriptions of Associations:Intact   Orientation:Full (Time, Place and Person)   Thought Content:Logical  Diagnosis of Schizophrenia or Schizoaffective disorder in past: No    Hallucinations:Hallucinations: None   Ideas of Reference:None   Suicidal Thoughts:Suicidal Thoughts: Yes, Passive SI Passive Intent and/or Plan: Without Intent; Without Plan   Homicidal Thoughts:Homicidal Thoughts: No   Sensorium  Memory:Immediate Fair; Recent Fair; Remote Fair   Judgment:Good   Insight:Good   Executive Functions  Concentration:Good   Attention Span:Fair   Recall:Fair   Fund of Knowledge:Fair   Language:Fair   Psychomotor Activity  Psychomotor Activity:Psychomotor Activity: Normal   Assets  Assets:Desire for Improvement; Social Support   Sleep  Sleep:Sleep: Fair   No data recorded  Physical Exam   Physical Exam Vitals and nursing note reviewed.  HENT:     Head: Normocephalic and atraumatic.  Pulmonary:     Effort: Pulmonary effort is normal.  Musculoskeletal:     Cervical back: Normal range of motion.  Neurological:     General: No focal deficit present.     Mental Status: She is alert. Mental status is at baseline.    Review of Systems  Constitutional: Negative.   Respiratory: Negative.    Cardiovascular: Negative.   Gastrointestinal: Negative.   Genitourinary: Negative.   Psychiatric/Behavioral:         Psychiatric subjective data addressed in PSE or HPI / daily subjective report   Blood pressure 114/80, pulse (!) 58, temperature 98.3 F (36.8 C), temperature source Oral, resp. rate 18, SpO2 99%. There is no height or weight on file to calculate BMI.  Treatment Plan Summary: Daily contact with patient to assess and evaluate symptoms and progress in treatment and Medication management: Scheduled: -- COWS with as needed clonidine protocol -- ID screening for high risk IVDU, results pending -- Patient in need of nicotine replacement; nicotine polacrilex (gum) and nicotine patch 14 mg / 24 hours ordered. Smoking cessation encouraged  PRNs              -- continue acetaminophen 650 mg every 6 hours as needed for mild to moderate pain, fever, and headaches              --  continue hydroxyzine 25 mg three times a day as needed for anxiety              -- continue bismuth subsalicylate 524 mg oral chewable tablet every 3 hours as needed for diarrhea / loose stools              -- continue senna 8.6 mg oral at bedtime and polyethylene glycol 17 g oral daily as needed for mild to moderate constipation              -- continue ondansetron 8 mg every 8 hours as needed for nausea or vomiting              -- continue aluminum-magnesium hydroxide + simethicone 30 mL every 4 hours as needed for heartburn or indigestion              -- continue melatonin 3 mg at bedtime as needed for  insomnia  -- As needed agitation protocol in-place  Augusto Gamble, MD 12/19/22 8:01 AM

## 2022-12-19 NOTE — ED Notes (Signed)
Patient observed resting quietly, eyes closed. Respirations equal and unlabored. Will continue to monitor for safety.  

## 2022-12-19 NOTE — ED Notes (Signed)
Patient is calm and cooperative. She denies SI/HI/AVH. Patient reports sleep was "ok" last night. States she needs to go home so she can have visitation with her child a 3 pm today. MD made aware. Patient denies any additional needs at this time. Will continue to monitor for safety.

## 2022-12-19 NOTE — ED Notes (Signed)
Patient observed/assessed in room in bed appearing in no immediate distress resting peacefully. Q15 minute checks continued by nursing staff. Will continue to monitor and support.

## 2022-12-19 NOTE — Group Note (Signed)
Group Topic: Understanding Self  Group Date: 12/19/2022 Start Time: 1030 End Time: 1115 Facilitators: Londell Moh, NT  Department: [facility]  Number of Participants: 6  Group Focus: acceptance and daily reflection Treatment Modality:  Psychoeducation Interventions utilized were patient education Purpose: increase insight  Name: Brittany Payne Date of Birth: [birth year]  MR: [id number]    Level of Participation: active Quality of Participation: attentive and motivated Interactions with others: gave feedback Mood/Affect: appropriate Triggers (if applicable): n/a Cognition: coherent/clear Progress: Gaining insight Response: Pt was active in group, she completed the worksheet that was given out. Plan: patient will be encouraged to continue to attend groups.  Patients Problems:  Patient Active Problem List   Diagnosis Date Noted   Polysubstance abuse (HCC) 12/17/2022   Opioid use disorder 12/13/2022   Substance induced mood disorder (HCC) 12/13/2022   Stimulant use disorder 12/13/2022   S/P right knee arthroscopy 05/28/22 DIAGNOSTIC/ NO TEARS/ NORMAL KNEE JOINT 06/04/2022   Right knee pain 05/28/2022   Umbilical hernia without obstruction and without gangrene 07/26/2019   Positive urine drug screen 12/31/2018   Anemia 02/05/2017   Bipolar disorder (HCC) 02/05/2017   Depression with anxiety 02/05/2017   Enlargement of spleen 02/05/2017   Migraine 02/05/2017

## 2023-01-17 ENCOUNTER — Other Ambulatory Visit: Payer: Self-pay

## 2023-01-17 ENCOUNTER — Emergency Department (HOSPITAL_COMMUNITY)
Admission: EM | Admit: 2023-01-17 | Discharge: 2023-01-17 | Discharge status: Against Medical Advice | Payer: Medicaid Other | Attending: Emergency Medicine | Admitting: Emergency Medicine

## 2023-01-17 ENCOUNTER — Encounter (HOSPITAL_COMMUNITY): Payer: Self-pay | Admitting: Emergency Medicine

## 2023-01-17 DIAGNOSIS — R350 Frequency of micturition: Secondary | ICD-10-CM | POA: Diagnosis not present

## 2023-01-17 DIAGNOSIS — R109 Unspecified abdominal pain: Secondary | ICD-10-CM | POA: Insufficient documentation

## 2023-01-17 DIAGNOSIS — R42 Dizziness and giddiness: Secondary | ICD-10-CM | POA: Diagnosis not present

## 2023-01-17 DIAGNOSIS — Z5321 Procedure and treatment not carried out due to patient leaving prior to being seen by health care provider: Secondary | ICD-10-CM | POA: Insufficient documentation

## 2023-01-17 NOTE — ED Triage Notes (Addendum)
Pt bib EMS for c/o L sided flank pain, dizziness, and SOB. EMS states pt was 99% on R/A with "clear lung sounds". Pt reports urinary frequency as well. Pt denies SOB in triage.

## 2023-02-20 ENCOUNTER — Emergency Department (HOSPITAL_COMMUNITY): Payer: MEDICAID

## 2023-02-20 ENCOUNTER — Other Ambulatory Visit: Payer: Self-pay

## 2023-02-20 ENCOUNTER — Encounter (HOSPITAL_COMMUNITY): Payer: Self-pay | Admitting: *Deleted

## 2023-02-20 ENCOUNTER — Emergency Department (HOSPITAL_COMMUNITY)
Admission: EM | Admit: 2023-02-20 | Discharge: 2023-02-20 | Disposition: A | Discharge status: Home / Self Care | Payer: MEDICAID | Attending: Student | Admitting: Student

## 2023-02-20 DIAGNOSIS — N12 Tubulo-interstitial nephritis, not specified as acute or chronic: Secondary | ICD-10-CM | POA: Diagnosis not present

## 2023-02-20 DIAGNOSIS — J45909 Unspecified asthma, uncomplicated: Secondary | ICD-10-CM | POA: Diagnosis not present

## 2023-02-20 DIAGNOSIS — R109 Unspecified abdominal pain: Secondary | ICD-10-CM | POA: Diagnosis present

## 2023-02-20 LAB — URINALYSIS, ROUTINE W REFLEX MICROSCOPIC
Bilirubin Urine: NEGATIVE
Glucose, UA: NEGATIVE mg/dL
Hgb urine dipstick: NEGATIVE
Ketones, ur: 5 mg/dL — AB
Nitrite: NEGATIVE
Protein, ur: 100 mg/dL — AB
Specific Gravity, Urine: 1.021 (ref 1.005–1.030)
WBC, UA: >50 WBC/hpf (ref 0–5)
pH: 5 (ref 5.0–8.0)

## 2023-02-20 LAB — CBC WITH DIFFERENTIAL/PLATELET
Abs Immature Granulocytes: 0.02 10*3/uL (ref 0.00–0.07)
Basophils Absolute: 0 10*3/uL (ref 0.0–0.1)
Basophils Relative: 0 %
Eosinophils Absolute: 0 10*3/uL (ref 0.0–0.5)
Eosinophils Relative: 1 %
HCT: 37.8 % (ref 36.0–46.0)
Hemoglobin: 12.6 g/dL (ref 12.0–15.0)
Immature Granulocytes: 0 %
Lymphocytes Relative: 12 %
Lymphs Abs: 0.8 10*3/uL (ref 0.7–4.0)
MCH: 29.6 pg (ref 26.0–34.0)
MCHC: 33.3 g/dL (ref 30.0–36.0)
MCV: 88.7 fL (ref 80.0–100.0)
Monocytes Absolute: 0.4 10*3/uL (ref 0.1–1.0)
Monocytes Relative: 6 %
Neutro Abs: 5.4 10*3/uL (ref 1.7–7.7)
Neutrophils Relative %: 81 %
Platelets: 155 10*3/uL (ref 150–400)
RBC: 4.26 MIL/uL (ref 3.87–5.11)
RDW: 16 % — ABNORMAL HIGH (ref 11.5–15.5)
WBC: 6.7 10*3/uL (ref 4.0–10.5)
nRBC: 0 % (ref 0.0–0.2)

## 2023-02-20 LAB — COMPREHENSIVE METABOLIC PANEL
ALT: 11 U/L (ref 0–44)
AST: 10 U/L — ABNORMAL LOW (ref 15–41)
Albumin: 3.9 g/dL (ref 3.5–5.0)
Alkaline Phosphatase: 94 U/L (ref 38–126)
Anion gap: 10 (ref 5–15)
BUN: 6 mg/dL (ref 6–20)
CO2: 25 mmol/L (ref 22–32)
Calcium: 8.7 mg/dL — ABNORMAL LOW (ref 8.9–10.3)
Chloride: 98 mmol/L (ref 98–111)
Creatinine, Ser: 0.39 mg/dL — ABNORMAL LOW (ref 0.44–1.00)
GFR, Estimated: >60 mL/min (ref >60)
Glucose, Bld: 96 mg/dL (ref 70–99)
Potassium: 3 mmol/L — ABNORMAL LOW (ref 3.5–5.1)
Sodium: 133 mmol/L — ABNORMAL LOW (ref 135–145)
Total Bilirubin: 1.1 mg/dL (ref 0.3–1.2)
Total Protein: 7.6 g/dL (ref 6.5–8.1)

## 2023-02-20 LAB — PREGNANCY, URINE: Preg Test, Ur: NEGATIVE

## 2023-02-20 MED ORDER — MORPHINE SULFATE (PF) 4 MG/ML IV SOLN
4.0000 mg | Freq: Once | INTRAVENOUS | Status: AC
  Administered 2023-02-20: 4 mg via INTRAVENOUS
  Filled 2023-02-20: qty 1

## 2023-02-20 MED ORDER — IOHEXOL 300 MG/ML  SOLN
100.0000 mL | Freq: Once | INTRAMUSCULAR | Status: AC | PRN
  Administered 2023-02-20: 100 mL via INTRAVENOUS

## 2023-02-20 MED ORDER — SODIUM CHLORIDE 0.9 % IV SOLN
2.0000 g | Freq: Once | INTRAVENOUS | Status: AC
  Administered 2023-02-20: 2 g via INTRAVENOUS
  Filled 2023-02-20: qty 20

## 2023-02-20 MED ORDER — ONDANSETRON 4 MG PO TBDP: 4.0000 mg | ORAL_TABLET | Freq: Three times a day (TID) | ORAL | 0 refills | Status: AC | PRN

## 2023-02-20 MED ORDER — ONDANSETRON HCL 4 MG/2ML IJ SOLN
4.0000 mg | Freq: Once | INTRAMUSCULAR | Status: AC
  Administered 2023-02-20: 4 mg via INTRAVENOUS
  Filled 2023-02-20: qty 2

## 2023-02-20 MED ORDER — CEPHALEXIN 500 MG PO CAPS: 500.0000 mg | ORAL_CAPSULE | Freq: Four times a day (QID) | ORAL | 0 refills | Status: AC

## 2023-02-20 MED ORDER — OXYCODONE-ACETAMINOPHEN 5-325 MG PO TABS: 1.0000 | ORAL_TABLET | Freq: Four times a day (QID) | ORAL | 0 refills | Status: AC | PRN

## 2023-02-20 MED ORDER — FENTANYL CITRATE PF 50 MCG/ML IJ SOSY
50.0000 ug | PREFILLED_SYRINGE | Freq: Once | INTRAMUSCULAR | Status: AC
  Administered 2023-02-20: 50 ug via INTRAVENOUS
  Filled 2023-02-20: qty 1

## 2023-02-20 MED ORDER — KETOROLAC TROMETHAMINE 15 MG/ML IJ SOLN
15.0000 mg | Freq: Once | INTRAMUSCULAR | Status: AC
  Administered 2023-02-20: 15 mg via INTRAVENOUS
  Filled 2023-02-20: qty 1

## 2023-02-20 NOTE — ED Provider Notes (Signed)
Signout from Dr. Posey Rea.  She is presenting with right flank pain and fever.  Urinary symptoms.  Urinalysis appears infected and has received ceftriaxone.  She is pending CT abdomen and pelvis to evaluate for pyelonephritis versus infected kidney stone.  If no signs of infected stone or other significant pathology can likely be discharged on antibiotics Physical Exam  BP 118/85   Pulse 77   Temp 98.4 F (36.9 C) (Oral)   Resp 20   Ht 5\' 3"  (1.6 m)   Wt 61.7 kg   LMP 02/06/2023   SpO2 99%   BMI 24.09 kg/m   Physical Exam  Procedures  Procedures  ED Course / MDM    Medical Decision Making Amount and/or Complexity of Data Reviewed Labs: ordered. Radiology: ordered.  Risk Prescription drug management.   CT showed some signs of pyelonephritis but no obstructive stone.  I reviewed this with patient.  Will prescribe short course of pain medication along with antibiotics and nausea medication.  Recommended close follow-up with PCP and return instructions discussed.       Terrilee Files, MD 02/21/23 [phone]

## 2023-02-20 NOTE — ED Triage Notes (Signed)
Pt c/o right flank pain that wraps around to abdomen along with fever that started 2 days ago. Pt reports pressure but no pain when she urinates.

## 2023-02-20 NOTE — ED Provider Notes (Signed)
[place] EMERGENCY DEPARTMENT AT [facility] Provider Note  CSN: [id number] Arrival date & time: 02/20/23 9562  Chief Complaint(s) Flank Pain  HPI Brittany Payne is a 32 y.o. female with PMH polysubstance use, bipolar 1, depression, asthma who presents emergency department for evaluation of flank pain and dysuria.  States that pain began over the last 48 hours with associated urgency and dysuria.  Endorses nausea and vomiting but denies chest pain, shortness of breath, headache, fever or other systemic symptoms.   Past Medical History Past Medical History:  Diagnosis Date   Asthma    Bipolar 1 disorder (HCC)    Depression    Substance abuse (HCC)    Patient Active Problem List   Diagnosis Date Noted   Opioid use with withdrawal (HCC) 12/19/2022   Polysubstance abuse (HCC) 12/17/2022   Opioid use disorder 12/13/2022   Substance induced mood disorder (HCC) 12/13/2022   Stimulant use disorder 12/13/2022   S/P right knee arthroscopy 05/28/22 DIAGNOSTIC/ NO TEARS/ NORMAL KNEE JOINT 06/04/2022   Right knee pain 05/28/2022   Umbilical hernia without obstruction and without gangrene 07/26/2019   Positive urine drug screen 12/31/2018   Anemia 02/05/2017   Bipolar disorder (HCC) 02/05/2017   Depression with anxiety 02/05/2017   Enlargement of spleen 02/05/2017   Migraine 02/05/2017   Home Medication(s) Prior to Admission medications   Medication Sig Start Date End Date Taking? Authorizing Provider  cephALEXin (KEFLEX) 500 MG capsule Take 1 capsule (500 mg total) by mouth 4 (four) times daily. 02/20/23  Yes Terrilee Files, MD  ondansetron (ZOFRAN-ODT) 4 MG disintegrating tablet Take 1 tablet (4 mg total) by mouth every 8 (eight) hours as needed for nausea or vomiting. 02/20/23  Yes Terrilee Files, MD  oxyCODONE-acetaminophen (PERCOCET/ROXICET) 5-325 MG tablet Take 1 tablet by mouth every 6 (six) hours as needed for severe pain. 02/20/23  Yes Terrilee Files, MD   naloxone [facility]) nasal spray 4 mg/0.1 mL Administer for suspected opioid overdose - may be administered by self or by others 12/19/22   Augusto Gamble, MD  sertraline (ZOLOFT) 100 MG tablet Take 100 mg by mouth daily.    [provider]  traZODone (DESYREL) 150 MG tablet Take 150 mg by mouth daily.    [provider]  cetirizine (ZYRTEC) 10 MG tablet Take 1 tablet (10 mg total) by mouth daily. Patient not taking: Reported on 03/06/2020 09/07/19 03/16/20  Pauline Aus, PA-C                                                                                                                                    Past Surgical History Past Surgical History:  Procedure Laterality Date   CESAREAN SECTION N/A    CHOLECYSTECTOMY     HERNIA REPAIR     KNEE ARTHROSCOPY WITH LATERAL MENISECTOMY Right 05/28/2022   Procedure: DIAGNOSTIC KNEE ARTHROSCOPY;  Surgeon: Vickki Hearing, MD;  Location: [facility];  Service: Orthopedics;  Laterality: Right;   KNEE CARTILAGE SURGERY Right 05/28/2022   MYRINGOTOMY     TUBAL LIGATION     Family History Family History  Problem Relation Age of Onset   Hypertension Mother    Diabetes Father    Hypertension Father    Cancer Father    Lung cancer Father    Breast cancer Maternal Uncle 43   Diabetes Other     Social History Social History   Tobacco Use   Smoking status: Every Day    Current packs/day: 0.00    Average packs/day: 0.3 packs/day for 8.0 years (2.0 ttl pk-yrs)    Types: Cigarettes    Start date: 12/21/2007    Last attempt to quit: 12/21/2015    Years since quitting: 7.1   Smokeless tobacco: Never  Vaping Use   Vaping status: Never Used  Substance Use Topics   Alcohol use: No   Drug use: Not Currently    Types: Marijuana   Allergies Penicillin g, Yellow jacket venom, Amoxicillin, Celexa [citalopram], Hydroxyzine pamoate, Penicillins, and Vistaril [hydroxyzine hcl]  Review of Systems Review of Systems  Genitourinary:  Positive  for dysuria, flank pain and urgency.    Physical Exam Vital Signs  I have reviewed the triage vital signs BP 113/79   Pulse 98   Temp 98 F (36.7 C) (Oral)   Resp 20   Ht 5\' 3"  (1.6 m)   Wt 61.7 kg   LMP 02/06/2023   SpO2 100%   BMI 24.09 kg/m   Physical Exam Vitals and nursing note reviewed.  Constitutional:      General: She is not in acute distress.    Appearance: She is well-developed.  HENT:     Head: Normocephalic and atraumatic.  Eyes:     Conjunctiva/sclera: Conjunctivae normal.  Cardiovascular:     Rate and Rhythm: Normal rate and regular rhythm.     Heart sounds: No murmur heard. Pulmonary:     Effort: Pulmonary effort is normal. No respiratory distress.     Breath sounds: Normal breath sounds.  Abdominal:     Palpations: Abdomen is soft.     Tenderness: There is abdominal tenderness. There is right CVA tenderness and left CVA tenderness.  Musculoskeletal:        General: No swelling.     Cervical back: Neck supple.  Skin:    General: Skin is warm and dry.     Capillary Refill: Capillary refill takes less than 2 seconds.  Neurological:     Mental Status: She is alert.  Psychiatric:        Mood and Affect: Mood normal.     ED Results and Treatments Labs (all labs ordered are listed, but only abnormal results are displayed) Labs Reviewed  COMPREHENSIVE METABOLIC PANEL - Abnormal; Notable for the following components:      Result Value   Sodium 133 (*)    Potassium 3.0 (*)    Creatinine, Ser 0.39 (*)    Calcium 8.7 (*)    AST 10 (*)    All other components within normal limits  CBC WITH DIFFERENTIAL/PLATELET - Abnormal; Notable for the following components:   RDW 16.0 (*)    All other components within normal limits  URINALYSIS, ROUTINE W REFLEX MICROSCOPIC - Abnormal; Notable for the following components:   Color, Urine AMBER (*)    APPearance HAZY (*)    Ketones, ur 5 (*)    Protein, ur 100 (*)  Leukocytes,Ua MODERATE (*)    Bacteria,  UA RARE (*)    All other components within normal limits  PREGNANCY, URINE                                                                                                                          Radiology CT ABDOMEN PELVIS W CONTRAST  Result Date: 02/20/2023 CLINICAL DATA:  Right lower quadrant abdominal pain EXAM: CT ABDOMEN AND PELVIS WITH CONTRAST TECHNIQUE: Multidetector CT imaging of the abdomen and pelvis was performed using the standard protocol following bolus administration of intravenous contrast. RADIATION DOSE REDUCTION: This exam was performed according to the departmental dose-optimization program which includes automated exposure control, adjustment of the mA and/or kV according to patient size and/or use of iterative reconstruction technique. CONTRAST:  OMNIPAQUE IOHEXOL 300 MG/ML  SOLN COMPARISON:  Most recent prior CT scan of the abdomen and pelvis 01/08/2022 FINDINGS: Lower chest: No acute abnormality. Hepatobiliary: No focal liver abnormality is seen. No gallstones, gallbladder wall thickening, or biliary dilatation. Pancreas: Unremarkable. No pancreatic ductal dilatation or surrounding inflammatory changes. Spleen: Splenomegaly. The spleen measures 18 cm in long axis, essentially unchanged compared to prior imaging. Adrenals/Urinary Tract: Adrenal glands are unremarkable. No hydronephrosis or nephrolithiasis. Mild focal ill-defined hypoattenuation within the posterior interpolar aspect of the right kidney. Bladder is unremarkable. Stomach/Bowel: Stomach is within normal limits. Appendix appears normal. No evidence of bowel wall thickening, distention, or inflammatory changes. Vascular/Lymphatic: No significant vascular findings are present. No enlarged abdominal or pelvic lymph nodes. Reproductive: Uterus and bilateral adnexa are unremarkable. Other: No abdominal wall hernia or abnormality. No abdominopelvic ascites. Musculoskeletal: No acute or significant osseous findings.  IMPRESSION: 1. Mild focal decreased attenuation of the posterior interpolar right kidney. Findings are nonspecific and may reflect a subcentimeter cyst, or focal pyelonephritis. 2. Otherwise, no acute abnormality within the abdomen or pelvis. Specifically, the appendix is normal. 3. Stable splenomegaly without significant interval progression compared to prior imaging dated 01/08/2022. 4. Surgical changes of prior cholecystectomy. Electronically Signed   By: Malachy Moan M.D.   On: 02/20/2023 17:09    Pertinent labs & imaging results that were available during my care of the patient were reviewed by me and considered in my medical decision making (see MDM for details).  Medications Ordered in ED Medications  ketorolac (TORADOL) 15 MG/ML injection 15 mg (15 mg Intravenous Given 02/20/23 1125)  ondansetron (ZOFRAN) injection 4 mg (4 mg Intravenous Given 02/20/23 1125)  cefTRIAXone (ROCEPHIN) 2 g in sodium chloride 0.9 % 100 mL IVPB (0 g Intravenous Stopped 02/20/23 1253)  fentaNYL (SUBLIMAZE) injection 50 mcg (50 mcg Intravenous Given 02/20/23 1220)  ondansetron (ZOFRAN) injection 4 mg (4 mg Intravenous Given 02/20/23 1216)  morphine (PF) 4 MG/ML injection 4 mg (4 mg Intravenous Given 02/20/23 1517)  iohexol (OMNIPAQUE) 300 MG/ML solution 100 mL (100 mLs Intravenous Contrast Given 02/20/23 1528)  Procedures Procedures  (including critical care time)  Medical Decision Making / ED Course   This patient presents to the ED for concern of flank pain, dysuria, this involves an extensive number of treatment options, and is a complaint that carries with it a high risk of complications and morbidity.  The differential diagnosis includes nephrolithiasis, pyelonephritis, obstruction, AAA, musculoskeletal strain, vertebral fracture, intra-abdominal abscess,  diverticulitis  MDM: Patient seen emergency room for evaluation of flank pain.  Physical exam with bilateral CVA tenderness and suprapubic tenderness to palpation.  Otherwise unremarkable.  Laboratory evaluation with no significant leukocytosis, sodium 133, potassium 3.0 urinalysis is concerning for infection with moderate leuk esterase, 11-20 red blood cells, greater than 50 white blood cells and rare bacteria.  Ceftriaxone initiated.  Pending CT abdomen pelvis to rule out septic stone versus pyelonephritis at time of signout.  Please see provider signout continuation of workup.   Additional history obtained:  -External records from outside source obtained and reviewed including: Chart review including previous notes, labs, imaging, consultation notes   Lab Tests: -I ordered, reviewed, and interpreted labs.   The pertinent results include:   Labs Reviewed  COMPREHENSIVE METABOLIC PANEL - Abnormal; Notable for the following components:      Result Value   Sodium 133 (*)    Potassium 3.0 (*)    Creatinine, Ser 0.39 (*)    Calcium 8.7 (*)    AST 10 (*)    All other components within normal limits  CBC WITH DIFFERENTIAL/PLATELET - Abnormal; Notable for the following components:   RDW 16.0 (*)    All other components within normal limits  URINALYSIS, ROUTINE W REFLEX MICROSCOPIC - Abnormal; Notable for the following components:   Color, Urine AMBER (*)    APPearance HAZY (*)    Ketones, ur 5 (*)    Protein, ur 100 (*)    Leukocytes,Ua MODERATE (*)    Bacteria, UA RARE (*)    All other components within normal limits  PREGNANCY, URINE       Imaging Studies ordered: I ordered imaging studies including CT abdomen pelvis and this is pending   Medicines ordered and prescription drug management: Meds ordered this encounter  Medications   ketorolac (TORADOL) 15 MG/ML injection 15 mg   ondansetron (ZOFRAN) injection 4 mg   cefTRIAXone (ROCEPHIN) 2 g in sodium chloride 0.9 % 100 mL  IVPB    Order Specific Question:   Antibiotic Indication:    Answer:   UTI   fentaNYL (SUBLIMAZE) injection 50 mcg   ondansetron (ZOFRAN) injection 4 mg   morphine (PF) 4 MG/ML injection 4 mg   iohexol (OMNIPAQUE) 300 MG/ML solution 100 mL   oxyCODONE-acetaminophen (PERCOCET/ROXICET) 5-325 MG tablet    Sig: Take 1 tablet by mouth every 6 (six) hours as needed for severe pain.    Dispense:  12 tablet    Refill:  0   ondansetron (ZOFRAN-ODT) 4 MG disintegrating tablet    Sig: Take 1 tablet (4 mg total) by mouth every 8 (eight) hours as needed for nausea or vomiting.    Dispense:  20 tablet    Refill:  0   cephALEXin (KEFLEX) 500 MG capsule    Sig: Take 1 capsule (500 mg total) by mouth 4 (four) times daily.    Dispense:  28 capsule    Refill:  0    -I have reviewed the patients home medicines and have made adjustments as needed  Critical interventions none    Cardiac  Monitoring: The patient was maintained on a cardiac monitor.  I personally viewed and interpreted the cardiac monitored which showed an underlying rhythm of: NSR  Social Determinants of Health:  Factors impacting patients care include: none   Reevaluation: After the interventions noted above, I reevaluated the patient and found that they have :improved  Co morbidities that complicate the patient evaluation  Past Medical History:  Diagnosis Date   Asthma    Bipolar 1 disorder (HCC)    Depression    Substance abuse (HCC)       Dispostion: I considered admission for this patient, and disposition pending CT abdomen pelvis.  Please see provider signout for continuation of workup.     Final Clinical Impression(s) / ED Diagnoses Final diagnoses:  Pyelonephritis     @PCDICTATION @    Glendora Score, MD 02/20/23 [phone]

## 2023-02-20 NOTE — Discharge Instructions (Addendum)
You were seen in the emergency department for right flank pain and fever.  Your workup shows a kidney infection on the right side.  You were given IV antibiotics.  We are sending you home with prescription for pain medication nausea medication and antibiotics.  Please keep well-hydrated and follow-up with your primary care doctor.  Return to the emergency department if any worsening or concerning symptoms.

## 2023-10-16 ENCOUNTER — Other Ambulatory Visit: Payer: Self-pay

## 2023-10-16 ENCOUNTER — Emergency Department (HOSPITAL_COMMUNITY): Payer: Self-pay

## 2023-10-16 ENCOUNTER — Emergency Department (HOSPITAL_COMMUNITY)
Admission: EM | Admit: 2023-10-16 | Discharge: 2023-10-16 | Disposition: A | Discharge status: Home / Self Care | Payer: Self-pay | Attending: Emergency Medicine | Admitting: Emergency Medicine

## 2023-10-16 ENCOUNTER — Encounter (HOSPITAL_COMMUNITY): Payer: Self-pay

## 2023-10-16 DIAGNOSIS — J45909 Unspecified asthma, uncomplicated: Secondary | ICD-10-CM | POA: Insufficient documentation

## 2023-10-16 DIAGNOSIS — U071 COVID-19: Secondary | ICD-10-CM | POA: Insufficient documentation

## 2023-10-16 DIAGNOSIS — R0781 Pleurodynia: Secondary | ICD-10-CM

## 2023-10-16 LAB — URINALYSIS, ROUTINE W REFLEX MICROSCOPIC
Bilirubin Urine: NEGATIVE
Glucose, UA: NEGATIVE mg/dL
Hgb urine dipstick: NEGATIVE
Ketones, ur: NEGATIVE mg/dL
Leukocytes,Ua: NEGATIVE
Nitrite: NEGATIVE
Protein, ur: 30 mg/dL — AB
Specific Gravity, Urine: 1.026 (ref 1.005–1.030)
pH: 6 (ref 5.0–8.0)

## 2023-10-16 LAB — CBC WITH DIFFERENTIAL/PLATELET
Abs Immature Granulocytes: 0.01 10*3/uL (ref 0.00–0.07)
Basophils Absolute: 0 10*3/uL (ref 0.0–0.1)
Basophils Relative: 0 %
Eosinophils Absolute: 0.1 10*3/uL (ref 0.0–0.5)
Eosinophils Relative: 2 %
HCT: 36.6 % (ref 36.0–46.0)
Hemoglobin: 12.5 g/dL (ref 12.0–15.0)
Immature Granulocytes: 0 %
Lymphocytes Relative: 21 %
Lymphs Abs: 1.2 10*3/uL (ref 0.7–4.0)
MCH: 31.1 pg (ref 26.0–34.0)
MCHC: 34.2 g/dL (ref 30.0–36.0)
MCV: 91 fL (ref 80.0–100.0)
Monocytes Absolute: 0.5 10*3/uL (ref 0.1–1.0)
Monocytes Relative: 8 %
Neutro Abs: 4 10*3/uL (ref 1.7–7.7)
Neutrophils Relative %: 69 %
Platelets: 164 10*3/uL (ref 150–400)
RBC: 4.02 MIL/uL (ref 3.87–5.11)
RDW: 14.5 % (ref 11.5–15.5)
WBC: 5.9 10*3/uL (ref 4.0–10.5)
nRBC: 0 % (ref 0.0–0.2)

## 2023-10-16 LAB — RESP PANEL BY RT-PCR (RSV, FLU A&B, COVID)  RVPGX2
Influenza A by PCR: NEGATIVE
Influenza B by PCR: NEGATIVE
Resp Syncytial Virus by PCR: NEGATIVE
SARS Coronavirus 2 by RT PCR: POSITIVE — AB

## 2023-10-16 LAB — COMPREHENSIVE METABOLIC PANEL WITH GFR
ALT: 9 U/L (ref 0–44)
AST: 13 U/L — ABNORMAL LOW (ref 15–41)
Albumin: 3.5 g/dL (ref 3.5–5.0)
Alkaline Phosphatase: 83 U/L (ref 38–126)
Anion gap: 7 (ref 5–15)
BUN: 7 mg/dL (ref 6–20)
CO2: 24 mmol/L (ref 22–32)
Calcium: 8.5 mg/dL — ABNORMAL LOW (ref 8.9–10.3)
Chloride: 104 mmol/L (ref 98–111)
Creatinine, Ser: 0.48 mg/dL (ref 0.44–1.00)
GFR, Estimated: >60 mL/min (ref >60)
Glucose, Bld: 84 mg/dL (ref 70–99)
Potassium: 3.1 mmol/L — ABNORMAL LOW (ref 3.5–5.1)
Sodium: 135 mmol/L (ref 135–145)
Total Bilirubin: 0.8 mg/dL (ref 0.0–1.2)
Total Protein: 6.9 g/dL (ref 6.5–8.1)

## 2023-10-16 LAB — LIPASE, BLOOD: Lipase: 30 U/L (ref 11–51)

## 2023-10-16 LAB — PREGNANCY, URINE: Preg Test, Ur: NEGATIVE

## 2023-10-16 LAB — TROPONIN I (HIGH SENSITIVITY): Troponin I (High Sensitivity): <2 ng/L (ref <18)

## 2023-10-16 MED ORDER — LIDOCAINE 5 % EX PTCH
1.0000 | MEDICATED_PATCH | CUTANEOUS | Status: DC
  Administered 2023-10-16: 1 via TRANSDERMAL
  Filled 2023-10-16: qty 1

## 2023-10-16 MED ORDER — IPRATROPIUM-ALBUTEROL 0.5-2.5 (3) MG/3ML IN SOLN
3.0000 mL | Freq: Once | RESPIRATORY_TRACT | Status: AC
  Administered 2023-10-16: 3 mL via RESPIRATORY_TRACT
  Filled 2023-10-16: qty 3

## 2023-10-16 MED ORDER — KETOROLAC TROMETHAMINE 15 MG/ML IJ SOLN
15.0000 mg | Freq: Once | INTRAMUSCULAR | Status: AC
  Administered 2023-10-16: 15 mg via INTRAVENOUS
  Filled 2023-10-16: qty 1

## 2023-10-16 MED ORDER — SODIUM CHLORIDE 0.9 % IV BOLUS
1000.0000 mL | Freq: Once | INTRAVENOUS | Status: AC
  Administered 2023-10-16: 1000 mL via INTRAVENOUS

## 2023-10-16 MED ORDER — ALBUTEROL SULFATE (2.5 MG/3ML) 0.083% IN NEBU
20.0000 mg | INHALATION_SOLUTION | Freq: Once | RESPIRATORY_TRACT | Status: AC
  Administered 2023-10-16: 20 mg via RESPIRATORY_TRACT
  Filled 2023-10-16: qty 24

## 2023-10-16 MED ORDER — METHOCARBAMOL 500 MG PO TABS
500.0000 mg | ORAL_TABLET | Freq: Once | ORAL | Status: AC
  Administered 2023-10-16: 500 mg via ORAL
  Filled 2023-10-16: qty 1

## 2023-10-16 MED ORDER — PREDNISONE 20 MG PO TABS: 40.0000 mg | ORAL_TABLET | Freq: Every day | ORAL | 0 refills | Status: AC

## 2023-10-16 MED ORDER — ACETAMINOPHEN 500 MG PO TABS
1000.0000 mg | ORAL_TABLET | Freq: Once | ORAL | Status: AC
  Administered 2023-10-16: 1000 mg via ORAL
  Filled 2023-10-16: qty 2

## 2023-10-16 MED ORDER — METHOCARBAMOL 500 MG PO TABS: 500.0000 mg | ORAL_TABLET | Freq: Two times a day (BID) | ORAL | 0 refills | Status: AC | PRN

## 2023-10-16 MED ORDER — LACTATED RINGERS IV BOLUS
1000.0000 mL | Freq: Once | INTRAVENOUS | Status: AC
  Administered 2023-10-16: 1000 mL via INTRAVENOUS

## 2023-10-16 MED ORDER — METHYLPREDNISOLONE SODIUM SUCC 125 MG IJ SOLR
125.0000 mg | Freq: Once | INTRAMUSCULAR | Status: AC
  Administered 2023-10-16: 125 mg via INTRAVENOUS
  Filled 2023-10-16: qty 2

## 2023-10-16 MED ORDER — ALBUTEROL SULFATE HFA 108 (90 BASE) MCG/ACT IN AERS: 1.0000 | INHALATION_SPRAY | Freq: Four times a day (QID) | RESPIRATORY_TRACT | 0 refills | Status: AC | PRN

## 2023-10-16 NOTE — ED Notes (Signed)
 Pt ambulated to the bathroom with family. IV still has about 300 cc of IVF remaining in bag. Encouraged to keep her arm straight.

## 2023-10-16 NOTE — ED Triage Notes (Signed)
 Pt arrived via POV c/o LUQ abdominal pain, constipation and nausea X 2 days.

## 2023-10-16 NOTE — Discharge Instructions (Addendum)
 You were seen in the ER today for evaluation of your cough and cold symptoms and rib pain.  You have COVID.  Likely think your rib pain is from your coughing.  I am glad that you are feeling better.  Please try the humidified air that we talked about.  You can buy cool-mist humidifier or use your shower.  For your cough you can take over-the-counter cough and cold medication.  I am also sending you in your albuterol  inhaler.  This should help with your coughing.  I am also sending you in some prednisone  which should help with your bronchitis/asthma saturation from the COVID.  I have also prescribed you Robaxin  which is a muscle laxer.  Please do not drive or operate heavy machinery while on the medication as it will make you sleepy.  You can also give over-the-counter lidocaine  patches to apply to your rib area to help.  For pain, recommend taking 1000 mg of normal and/or 600 mg of ibuprofen  every 6 hours as needed for pain.  Please help with your primary care doctor in the next few days for reevaluation.  Remember COVID-19 is contagious and please make sure that you are staying indoors to prevent the spread.  If you have any concerns with new or worsening symptoms, please return to your nearest Emergency Department for reevaluation.  Get help right away if: You have trouble breathing or get short of breath. You have pain or pressure in your chest. You're feeling confused. These symptoms may be an emergency. Get help right away. Call 911. Do not wait to see if the symptoms will go away. Do not drive yourself to the hospital.

## 2023-10-16 NOTE — ED Provider Notes (Signed)
 [place] EMERGENCY DEPARTMENT AT [facility] Provider Note   CSN: [id number] Arrival date & time: 10/16/23  1049     History Chief Complaint  Patient presents with   Cough    Brittany Payne is a 33 y.o. female with h/o asthma present to the ER for evaluation of cough and cold symptoms for the past 3 days.  Reports she is having a dry cough with subjective fever and bodyaches.  Reports runny nose and nasal congestion.  She does have some tenderness to the left lower rib cage.  Denies any chest pain or shortness of breath.  Denies any abdominal pain, vomiting, diarrhea, constipation.  Reports she is a bowel movement every 2 days.  Does have some nausea.  Denies any urinary or vaginal symptoms.  Has tried Delsym  and Mucinex  for symptoms without much relief.  She reports that she does have asthma and has been out of her inhaler and has not been able to try it as someone stole it.  No known recent sick contacts.  She is a pack per day smoker. Allergic to PCN.    Cough Associated symptoms: chills, fever (Subjective) and rhinorrhea   Associated symptoms: no chest pain, no ear pain, no headaches, no shortness of breath and no sore throat        Home Medications Prior to Admission medications   Medication Sig Start Date End Date Taking? Authorizing Provider  cephALEXin  (KEFLEX ) 500 MG capsule Take 1 capsule (500 mg total) by mouth 4 (four) times daily. 02/20/23   Tonya Fredrickson, MD  naloxone  (NARCAN ) nasal spray 4 mg/0.1 mL Administer for suspected opioid overdose - may be administered by self or by others 12/19/22   Dru Georges, MD  ondansetron  (ZOFRAN -ODT) 4 MG disintegrating tablet Take 1 tablet (4 mg total) by mouth every 8 (eight) hours as needed for nausea or vomiting. 02/20/23   Tonya Fredrickson, MD  oxyCODONE -acetaminophen  (PERCOCET/ROXICET) 5-325 MG tablet Take 1 tablet by mouth every 6 (six) hours as needed for severe pain. 02/20/23   Butler, Michael C, MD  sertraline  (ZOLOFT) 100 MG tablet Take 100 mg by mouth daily.    [provider]  traZODone  (DESYREL ) 150 MG tablet Take 150 mg by mouth daily.    [provider]  cetirizine  (ZYRTEC ) 10 MG tablet Take 1 tablet (10 mg total) by mouth daily. Patient not taking: Reported on 03/06/2020 09/07/19 03/16/20  Catherne Clubs, PA-C      Allergies    Penicillin g, Yellow jacket venom, Amoxicillin, Celexa [citalopram], Hydroxyzine pamoate, Penicillins, Shrimp (diagnostic), and Vistaril [hydroxyzine hcl]    Review of Systems   Review of Systems  Constitutional:  Positive for chills and fever (Subjective).  HENT:  Positive for congestion and rhinorrhea. Negative for ear pain and sore throat.   Respiratory:  Positive for cough. Negative for shortness of breath.   Cardiovascular:  Negative for chest pain and leg swelling.  Gastrointestinal:  Positive for nausea. Negative for abdominal pain, constipation, diarrhea and vomiting.  Genitourinary:  Negative for dysuria, hematuria, vaginal bleeding and vaginal discharge.  Neurological:  Negative for headaches.    Physical Exam Updated Vital Signs BP (!) 134/99   Pulse 90   Temp 98.9 F (37.2 C) (Oral)   Resp (!) 22   Ht 5\' 3"  (1.6 m)   Wt 59.9 kg   LMP 10/01/2023 (Approximate)   SpO2 100%   BMI 23.38 kg/m  Physical Exam Vitals and nursing note reviewed.  Constitutional:      General: She is not in acute distress.    Appearance: She is not toxic-appearing.  HENT:     Right Ear: Tympanic membrane, ear canal and external ear normal.     Left Ear: Tympanic membrane, ear canal and external ear normal.     Nose:     Comments: Bilateral nasal turbinate edema and erythema with scant clear nasal discharge.    Mouth/Throat:     Mouth: Mucous membranes are dry.     Comments: No pharyngeal erythema, exudate, or edema noted.  Uvula midline.  Airway patent.  Moist mucous membranes. Eyes:     General: No scleral icterus.    Conjunctiva/sclera:  Conjunctivae normal.  Cardiovascular:     Rate and Rhythm: Normal rate.  Pulmonary:     Effort: Pulmonary effort is normal. No respiratory distress.     Breath sounds: Wheezing and rhonchi present.     Comments: Significant expiratory rhonchi with prolonged x-ray phase.  She does have some tenderness over the left anterior ribs to the more inferior aspect.  No overlying skin changes noted. Chest:     Chest wall: Tenderness present.  Abdominal:     General: Abdomen is flat.     Palpations: Abdomen is soft.     Tenderness: There is no abdominal tenderness. There is no guarding or rebound.  Musculoskeletal:        General: No deformity.     Cervical back: Normal range of motion. No rigidity.  Lymphadenopathy:     Cervical: No cervical adenopathy.  Skin:    General: Skin is warm and dry.  Neurological:     General: No focal deficit present.     Mental Status: She is alert.     ED Results / Procedures / Treatments   Labs (all labs ordered are listed, but only abnormal results are displayed) Labs Reviewed  RESP PANEL BY RT-PCR (RSV, FLU A&B, COVID)  RVPGX2 - Abnormal; Notable for the following components:      Result Value   SARS Coronavirus 2 by RT PCR POSITIVE (*)    All other components within normal limits  COMPREHENSIVE METABOLIC PANEL WITH GFR - Abnormal; Notable for the following components:   Potassium 3.1 (*)    Calcium  8.5 (*)    AST 13 (*)    All other components within normal limits  URINALYSIS, ROUTINE W REFLEX MICROSCOPIC - Abnormal; Notable for the following components:   APPearance HAZY (*)    Protein, ur 30 (*)    Bacteria, UA RARE (*)    All other components within normal limits  LIPASE, BLOOD  CBC WITH DIFFERENTIAL/PLATELET  PREGNANCY, URINE  TROPONIN I (HIGH SENSITIVITY)    EKG None  Radiology DG Chest 2 View Result Date: 10/16/2023 CLINICAL DATA:  cough. EXAM: CHEST - 2 VIEW COMPARISON:  02/16/2022. FINDINGS: Bilateral lung fields are clear.  Bilateral costophrenic angles are clear. Normal cardio-mediastinal silhouette. No acute osseous abnormalities. The soft tissues are within normal limits. There are surgical clips in the right upper quadrant, typical of a previous cholecystectomy. IMPRESSION: No active cardiopulmonary disease. Electronically Signed   By: Beula Brunswick M.D.   On: 10/16/2023 13:26    Procedures Procedures   Medications Ordered in ED Medications  lidocaine  (LIDODERM ) 5 % 1 patch (1 patch Transdermal Patch Applied 10/16/23 1724)  sodium chloride  0.9 % bolus 1,000 mL (has no administration in time range)  methocarbamol  (ROBAXIN ) tablet 500 mg (has no administration in  time range)  lactated ringers  bolus 1,000 mL (0 mLs Intravenous Stopped 10/16/23 1730)  ipratropium-albuterol  (DUONEB) 0.5-2.5 (3) MG/3ML nebulizer solution 3 mL (3 mLs Nebulization Given 10/16/23 1355)  methylPREDNISolone  sodium succinate (SOLU-MEDROL ) 125 mg/2 mL injection 125 mg (125 mg Intravenous Given 10/16/23 1355)  ipratropium-albuterol  (DUONEB) 0.5-2.5 (3) MG/3ML nebulizer solution 3 mL (3 mLs Nebulization Given 10/16/23 1743)  ipratropium-albuterol  (DUONEB) 0.5-2.5 (3) MG/3ML nebulizer solution 3 mL (3 mLs Nebulization Given 10/16/23 1743)  albuterol  (PROVENTIL ) (2.5 MG/3ML) 0.083% nebulizer solution 20 mg (20 mg Nebulization Given 10/16/23 1744)  ketorolac  (TORADOL ) 15 MG/ML injection 15 mg (15 mg Intravenous Given 10/16/23 1725)  acetaminophen  (TYLENOL ) tablet 1,000 mg (1,000 mg Oral Given 10/16/23 1725)    ED Course/ Medical Decision Making/ A&P   Medical Decision Making Amount and/or Complexity of Data Reviewed Labs: ordered. Radiology: ordered.  Risk OTC drugs. Prescription drug management.   33 y.o. female presents to the ER for evaluation of cough and cold symptoms. Differential diagnosis includes but is not limited to viral illness, COVID, flu, RSV, bronchitis, PNA. Vital signs BP 139/101, otherwise unremarkable. Physical exam as noted  above.   Patient may have some nausea but no vomiting, diarrhea, constipation, dysuria, hematuria.  Nursing note mentions left upper quadrant abdominal pain but is tender to touch over the left ribs.  No abdominal tenderness palpation.  Nursing note also mentions constipation however patient reports she has had no trouble with her bowels last bowel movement being 2 days ago which she reports is typical for her.  Her main complaint is cough with bodyaches.  She has coarse rhonchi and auscultation, will order breathing treatments.  Will order FLS workup.  I independently reviewed and interpreted the patient's labs.  CBC without leukocytosis or anemia.  Lipase within normal limits.  CMP shows potassium of 3.1, calcium  8.5, AST 13 otherwise no electrolyte or LFT abnormality.  Troponin less than 2.  Urinalysis shows hazy urine with 30 protein and rare bacteria with 6-10 white blood cells but no leukocytes or nitrites.  Patient not having any urinary symptoms.  She is positive for COVID.  Pregnancy is negative.  CXR shows No active cardiopulmonary disease.   Patient lung sounds have improved some but will order additional DuoNeb's.  She is requesting food.  On reevaluation, patient's lung sounds have significantly improved I do not appreciate any rhonchi or wheezing.  She reports that she is feeling much better.  I likely think that the COVID was those worsening her asthma.  Will send her home with a albuterol  inhaler and some prednisone  to help with her symptoms.  She is the treatment window for any Paxlovid.  She is having rib pain which I believe think is from her coughing.  Will prescribe a muscle laxer and recommend lidocaine  patches as well.  Her significant other will be driving her home.  Patient had multiple DuoNebs continuous neb, likely was causing her tachycardia.  I ordered some fluids as well as some Tylenol  and Robaxin  for her muscular pain.  Her lung sounds have significantly improved, she is  feeling much better and is eating without any nausea.  She is moving without any difficulty.  Likely COVID causing some of her body aches and dry cough.  She has no chest pain or shortness of breath.  She stable for discharge home with close outpatient follow-up.  We discussed the results of the labs/imaging. The plan is albuterol , prednisone , Tylenol /Motrin /Robaxin , follow with PCP. We discussed strict return precautions and red  flag symptoms. The patient verbalized their understanding and agrees to the plan. The patient is stable and being discharged home in good condition.  Portions of this report may have been transcribed using voice recognition software. Every effort was made to ensure accuracy; however, inadvertent computerized transcription errors may be present.    Final Clinical Impression(s) / ED Diagnoses Final diagnoses:  COVID  Rib pain    Rx / DC Orders ED Discharge Orders          Ordered    albuterol  (VENTOLIN  HFA) 108 (90 Base) MCG/ACT inhaler  Every 6 hours PRN        10/16/23 1854    predniSONE  (DELTASONE ) 20 MG tablet  Daily        10/16/23 1854    methocarbamol  (ROBAXIN ) 500 MG tablet  2 times daily PRN        10/16/23 1854              Spence Dux, PA-C 10/19/23 2113    Merdis Stalling, MD 10/20/23 1535

## 2023-10-16 NOTE — ED Notes (Signed)
 Pt ambulated to the bathroom with family member in no acute distress.

## 2024-01-02 ENCOUNTER — Encounter: Payer: Self-pay | Admitting: Radiology

## 2024-01-21 IMAGING — MG DIGITAL DIAGNOSTIC BILAT W/ TOMO W/ CAD
8 of 14 series · 8 of 40 positions shown · non-contrast
Comparison: None.

CLINICAL DATA: 30-year-old female presenting for evaluation of 2
palpable lumps in the right breast for 6 weeks. She also had
significant greenish spontaneous discharge from the right nipple
about 3 days ago. Since then she can manually expressed white
discharge, and it is seen from multiple ducts in the right breast.
She has family history of breast cancer in a paternal aunt who
passed away the age 34.

EXAM:
DIGITAL DIAGNOSTIC BILATERAL MAMMOGRAM WITH TOMOSYNTHESIS AND CAD;
ULTRASOUND RIGHT BREAST LIMITED
TECHNIQUE: Bilateral digital diagnostic mammography and breast tomosynthesis
was performed. The images were evaluated with computer-aided
detection.; Targeted ultrasound examination of the right breast was
performed

[R CC synth-2D]
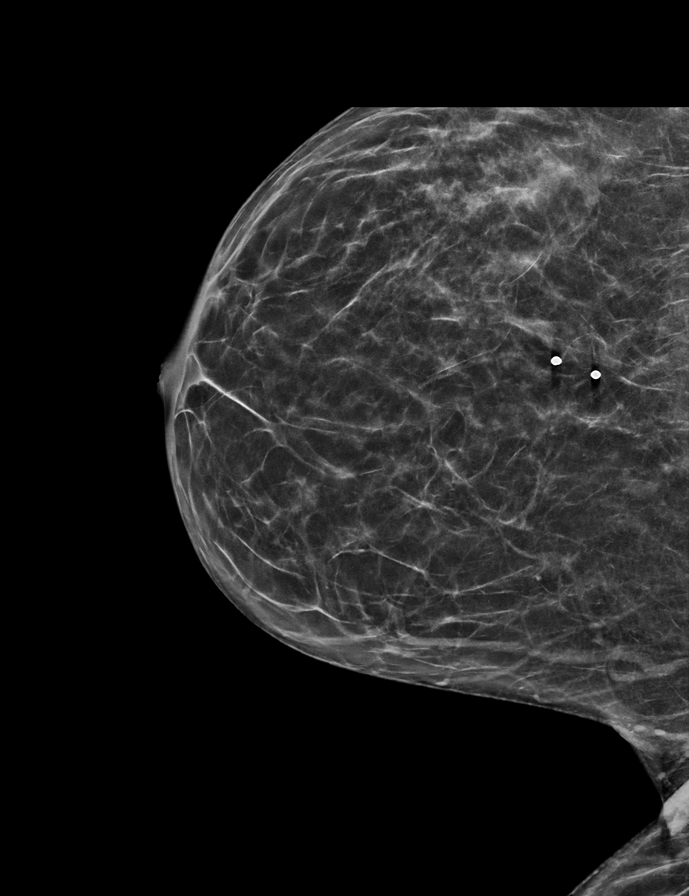

[R TAN synth-2D (1 of 2)]
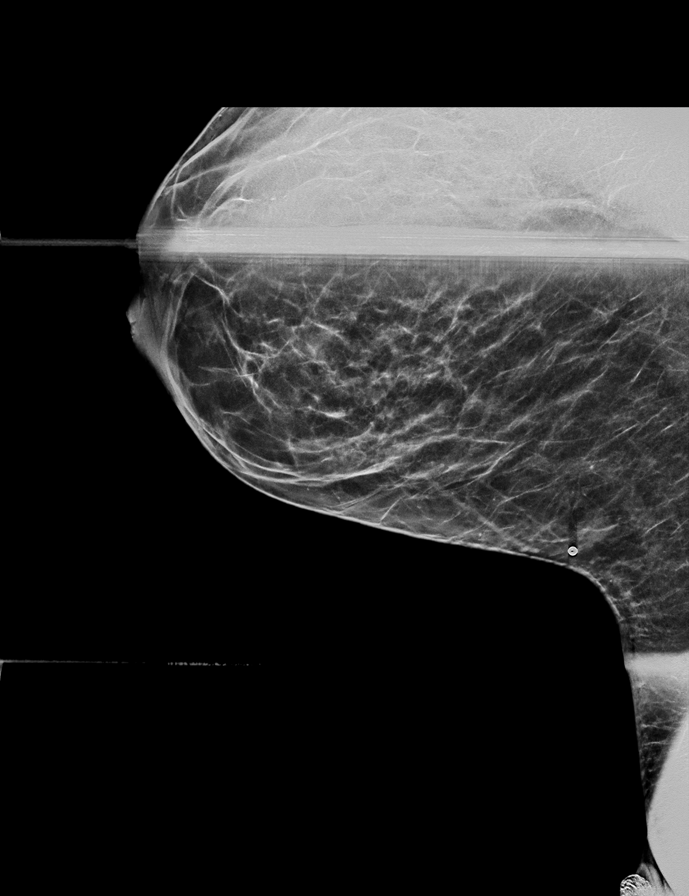

[L MLO synth-2D]
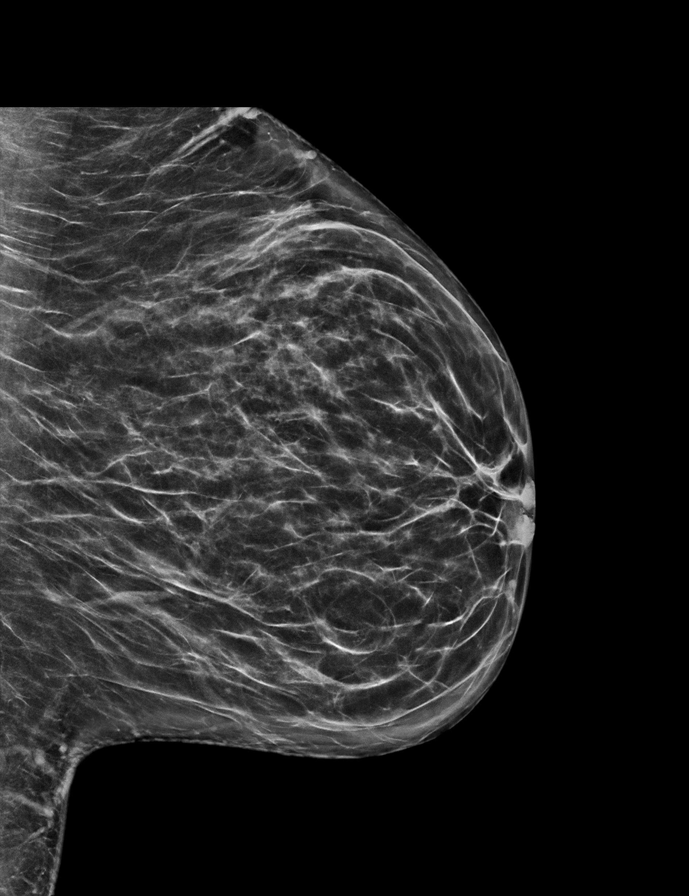

[L CC synth-2D]
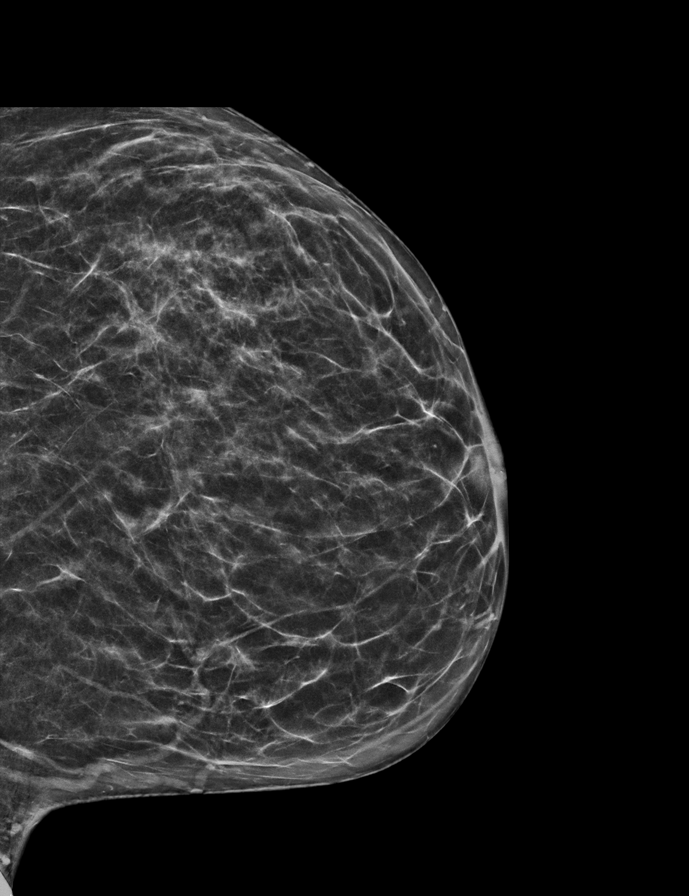

[R MLO synth-2D (1 of 2)]
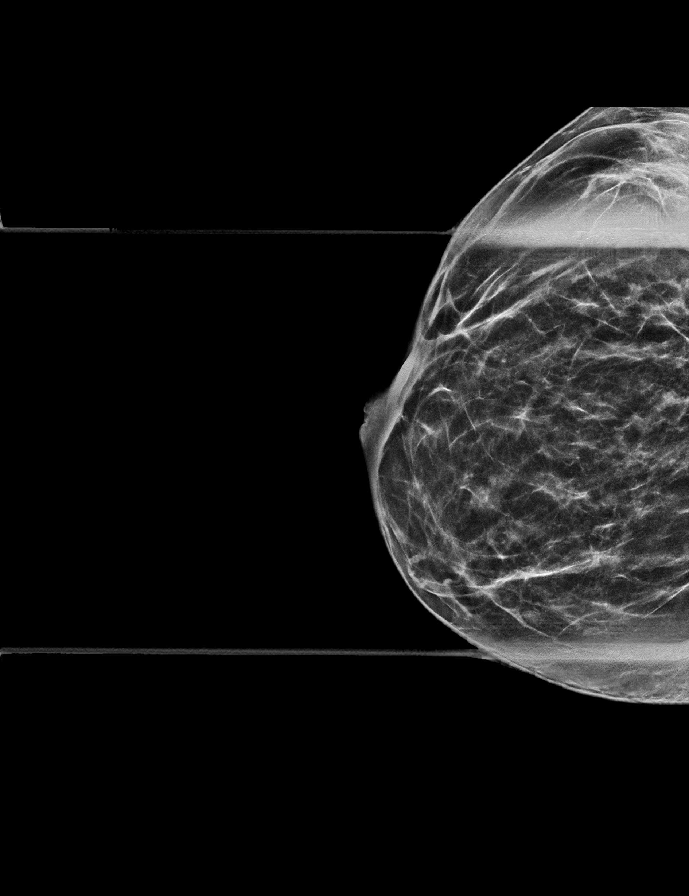

[R TAN synth-2D (2 of 2)]
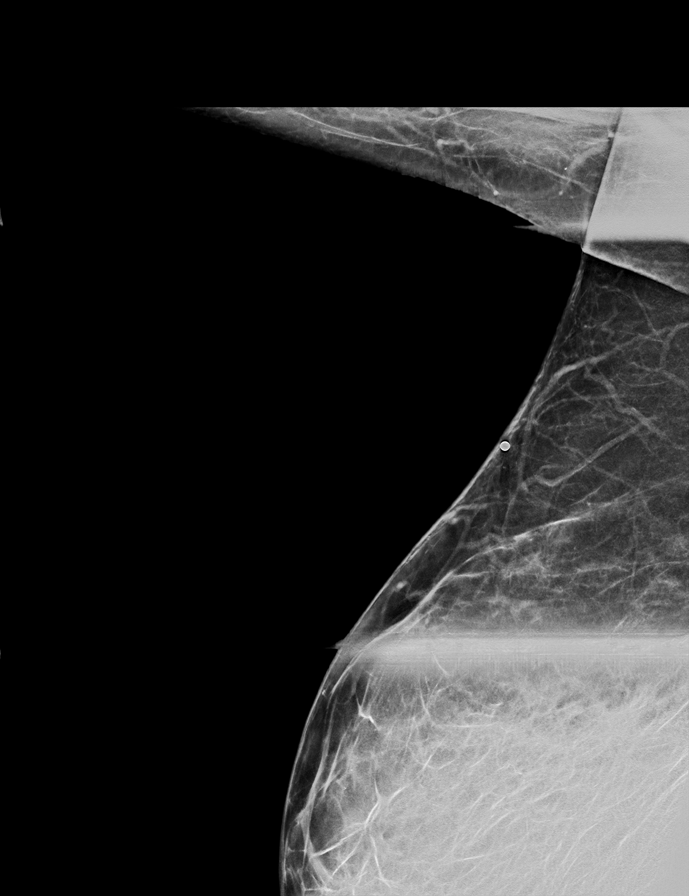

[R MLO synth-2D (2 of 2)]
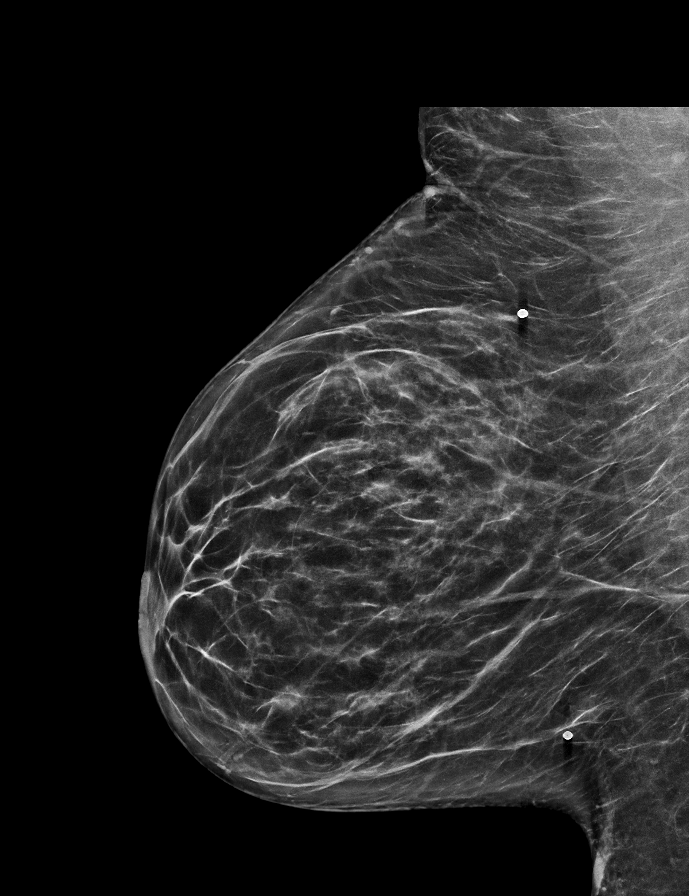

[R TAN tomo · tomo slice 23/46.0]
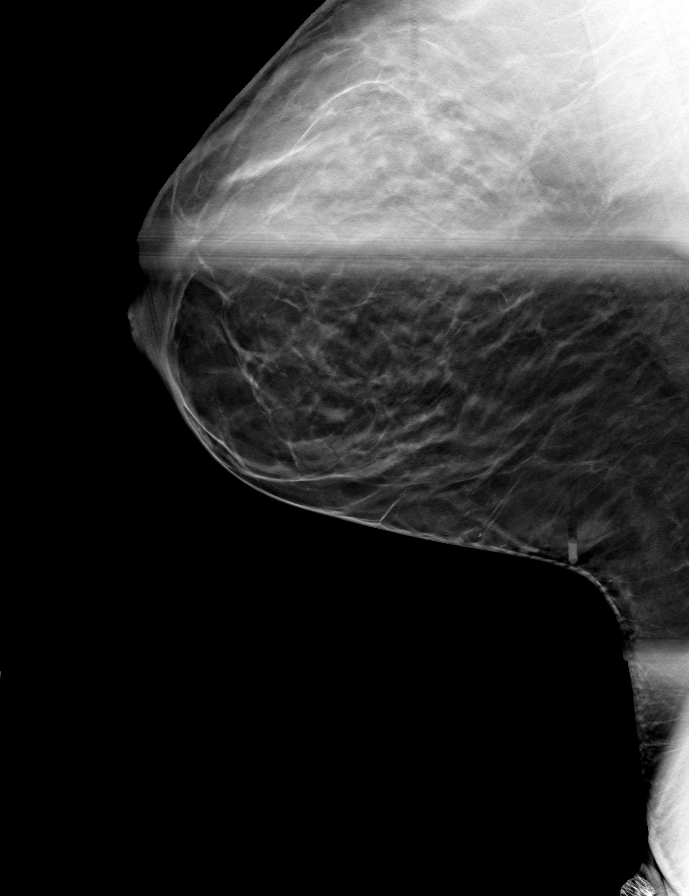

[8 of 40 positions shown; findings below may reference images not displayed]

ACR Breast Density Category b: There are scattered areas of
fibroglandular density.
FINDINGS: A BB has been placed along the superior and inferior aspect of the
right breast indicating the palpable sites of concern. No suspicious
findings are identified deep to these palpable markers. No
suspicious calcifications, masses or areas of distortion are seen in
the bilateral breasts.

Ultrasound targeted to the retroareolar right breast demonstrates
normal fibroglandular tissue. No abnormal findings are seen at the
palpable site in the superior right breast at 1 o'clock,
approximately 10 cm from the nipple. At the second palpable site in
the right breast at 6 o'clock, 6 cm from the nipple there is a
hypoechoic mass within the skin measuring 1.3 x 0.2 x 0.6 cm. A
tract is seen going to the skin surface. This is consistent with a
sebaceous cyst.
IMPRESSION: 1. There are no suspicious mammographic or targeted sonographic
abnormalities in the retroareolar right breast to correspond with
the patient's new nipple discharge.

2. No suspicious findings are found at the palpable site in the
superior right breast.

3. There is a benign sebaceous cyst at the inframammary fold in the
right breast at 6 o'clock.

4.  No mammographic evidence of malignancy in the bilateral breasts.

RECOMMENDATION:
1. Further management of nipple discharge should be based on
clinical assessment. This is felt to be physiologic/related to a
benign process given that it is from multiple ducts and white in
color. The patient was advised to alert her doctor if she
experiences spontaneous unilateral bloody or clear nipple discharge
from a single duct only.

2. Screening mammogram at age 38 unless there are persistent or
intervening clinical concerns. (Code:[id number])

I have discussed the findings and recommendations with the patient.
If applicable, a reminder letter will be sent to the patient
regarding the next appointment.

BI-RADS CATEGORY  1: Negative.

## 2024-03-15 ENCOUNTER — Encounter: Payer: Self-pay | Admitting: Radiology

## 2024-06-02 ENCOUNTER — Encounter: Payer: MEDICAID | Admitting: Obstetrics & Gynecology
